# Patient Record
Sex: Male | Born: 1970 | Race: White | Hispanic: No | Marital: Married | Smoking: Never smoker
Health system: Southern US, Community
[De-identification: ages and names within clinical notes are randomized; demographics above are authoritative.]

## PROBLEM LIST (undated history)

## (undated) DIAGNOSIS — R011 Cardiac murmur, unspecified: Secondary | ICD-10-CM

## (undated) DIAGNOSIS — F419 Anxiety disorder, unspecified: Secondary | ICD-10-CM

## (undated) DIAGNOSIS — E119 Type 2 diabetes mellitus without complications: Secondary | ICD-10-CM

## (undated) DIAGNOSIS — F32A Depression, unspecified: Secondary | ICD-10-CM

## (undated) DIAGNOSIS — Q6689 Other  specified congenital deformities of feet: Secondary | ICD-10-CM

## (undated) DIAGNOSIS — Z87438 Personal history of other diseases of male genital organs: Secondary | ICD-10-CM

## (undated) DIAGNOSIS — I10 Essential (primary) hypertension: Secondary | ICD-10-CM

## (undated) HISTORY — DX: Depression, unspecified: F32.A

## (undated) HISTORY — DX: Anxiety disorder, unspecified: F41.9

## (undated) HISTORY — PX: SPINE SURGERY: SHX786

## (undated) HISTORY — DX: Personal history of other diseases of male genital organs: Z87.438

## (undated) HISTORY — PX: FRACTURE SURGERY: SHX138

## (undated) HISTORY — DX: Cardiac murmur, unspecified: R01.1

---
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
## 1999-05-03 DIAGNOSIS — G4700 Insomnia, unspecified: Secondary | ICD-10-CM | POA: Insufficient documentation

## 2000-01-28 DIAGNOSIS — E119 Type 2 diabetes mellitus without complications: Secondary | ICD-10-CM | POA: Insufficient documentation

## 2003-01-01 ENCOUNTER — Other Ambulatory Visit: Payer: Self-pay

## 2004-02-28 HISTORY — PX: KNEE SURGERY: SHX244

## 2006-01-27 ENCOUNTER — Emergency Department: Payer: Self-pay | Admitting: Emergency Medicine

## 2006-06-11 DIAGNOSIS — I10 Essential (primary) hypertension: Secondary | ICD-10-CM | POA: Insufficient documentation

## 2006-09-03 ENCOUNTER — Ambulatory Visit: Payer: Self-pay | Admitting: Emergency Medicine

## 2006-10-22 DIAGNOSIS — R809 Proteinuria, unspecified: Secondary | ICD-10-CM | POA: Insufficient documentation

## 2007-05-11 ENCOUNTER — Emergency Department: Payer: Self-pay | Admitting: Emergency Medicine

## 2008-03-18 DIAGNOSIS — L989 Disorder of the skin and subcutaneous tissue, unspecified: Secondary | ICD-10-CM | POA: Insufficient documentation

## 2008-07-07 DIAGNOSIS — F419 Anxiety disorder, unspecified: Secondary | ICD-10-CM | POA: Insufficient documentation

## 2008-08-23 ENCOUNTER — Emergency Department: Payer: Self-pay | Admitting: Emergency Medicine

## 2008-10-20 ENCOUNTER — Ambulatory Visit: Payer: Self-pay | Admitting: Family Medicine

## 2009-03-18 ENCOUNTER — Ambulatory Visit: Payer: Self-pay | Admitting: Family Medicine

## 2009-03-21 DIAGNOSIS — M25559 Pain in unspecified hip: Secondary | ICD-10-CM | POA: Insufficient documentation

## 2009-05-23 ENCOUNTER — Emergency Department: Payer: Self-pay | Admitting: Emergency Medicine

## 2010-05-25 ENCOUNTER — Ambulatory Visit: Payer: Self-pay | Admitting: Family Medicine

## 2010-06-25 ENCOUNTER — Emergency Department: Payer: Self-pay | Admitting: Emergency Medicine

## 2011-01-22 ENCOUNTER — Emergency Department: Payer: Self-pay | Admitting: Unknown Physician Specialty

## 2011-05-02 ENCOUNTER — Emergency Department: Payer: Self-pay | Admitting: Emergency Medicine

## 2011-05-02 LAB — CBC
HCT: 42 % (ref 40.0–52.0)
HGB: 14.3 g/dL (ref 13.0–18.0)
MCH: 30.2 pg (ref 26.0–34.0)
MCHC: 34.1 g/dL (ref 32.0–36.0)
RBC: 4.74 10*6/uL (ref 4.40–5.90)
WBC: 6.1 10*3/uL (ref 3.8–10.6)

## 2011-05-02 LAB — COMPREHENSIVE METABOLIC PANEL
Albumin: 3.9 g/dL (ref 3.4–5.0)
Alkaline Phosphatase: 39 U/L — ABNORMAL LOW (ref 50–136)
BUN: 14 mg/dL (ref 7–18)
Bilirubin,Total: 0.7 mg/dL (ref 0.2–1.0)
Creatinine: 0.97 mg/dL (ref 0.60–1.30)
EGFR (African American): >60
Glucose: 121 mg/dL — ABNORMAL HIGH (ref 65–99)
Osmolality: 283 (ref 275–301)
SGOT(AST): 36 U/L (ref 15–37)
SGPT (ALT): 45 U/L
Total Protein: 7.5 g/dL (ref 6.4–8.2)

## 2011-05-02 LAB — URINALYSIS, COMPLETE
Bacteria: NONE SEEN
Blood: NEGATIVE
Glucose,UR: NEGATIVE mg/dL (ref 0–75)
Leukocyte Esterase: NEGATIVE
Nitrite: NEGATIVE
Protein: NEGATIVE
WBC UR: 1 /HPF (ref 0–5)

## 2011-05-02 LAB — LIPASE, BLOOD: Lipase: 118 U/L (ref 73–393)

## 2011-08-18 ENCOUNTER — Ambulatory Visit: Payer: Self-pay | Admitting: General Practice

## 2011-09-14 ENCOUNTER — Emergency Department: Payer: Self-pay | Admitting: Emergency Medicine

## 2011-09-14 LAB — CBC
HCT: 42.5 % (ref 40.0–52.0)
MCV: 89 fL (ref 80–100)
RDW: 13.1 % (ref 11.5–14.5)
WBC: 11.3 10*3/uL — ABNORMAL HIGH (ref 3.8–10.6)

## 2011-09-14 LAB — URINALYSIS, COMPLETE
Bacteria: NONE SEEN
Bilirubin,UR: NEGATIVE
Blood: NEGATIVE
Glucose,UR: NEGATIVE mg/dL (ref 0–75)
Ketone: NEGATIVE
Leukocyte Esterase: NEGATIVE
Nitrite: NEGATIVE
Ph: 6 (ref 4.5–8.0)
Protein: NEGATIVE
RBC,UR: 1 /HPF (ref 0–5)
Specific Gravity: 1.023 (ref 1.003–1.030)
Squamous Epithelial: <1
WBC UR: 1 /HPF (ref 0–5)

## 2011-09-14 LAB — BASIC METABOLIC PANEL
Anion Gap: 7 (ref 7–16)
BUN: 16 mg/dL (ref 7–18)
Calcium, Total: 8.9 mg/dL (ref 8.5–10.1)
Chloride: 106 mmol/L (ref 98–107)
Co2: 30 mmol/L (ref 21–32)
Creatinine: 1.03 mg/dL (ref 0.60–1.30)
Osmolality: 286 (ref 275–301)
Potassium: 3.8 mmol/L (ref 3.5–5.1)

## 2011-10-27 ENCOUNTER — Emergency Department: Payer: Self-pay | Admitting: Emergency Medicine

## 2011-10-27 LAB — URINALYSIS, COMPLETE
Bacteria: NONE SEEN
Blood: NEGATIVE
Glucose,UR: NEGATIVE mg/dL (ref 0–75)
Nitrite: NEGATIVE
Ph: 5 (ref 4.5–8.0)
Protein: NEGATIVE
RBC,UR: 1 /HPF (ref 0–5)
Specific Gravity: 1.027 (ref 1.003–1.030)
Squamous Epithelial: NONE SEEN

## 2011-10-27 LAB — BASIC METABOLIC PANEL
Anion Gap: 7 (ref 7–16)
Calcium, Total: 8.8 mg/dL (ref 8.5–10.1)
Chloride: 107 mmol/L (ref 98–107)
Creatinine: 0.94 mg/dL (ref 0.60–1.30)
EGFR (Non-African Amer.): >60
Glucose: 106 mg/dL — ABNORMAL HIGH (ref 65–99)
Osmolality: 283 (ref 275–301)
Potassium: 3.6 mmol/L (ref 3.5–5.1)

## 2011-10-27 LAB — CBC
HCT: 41.3 % (ref 40.0–52.0)
MCH: 30.6 pg (ref 26.0–34.0)
MCV: 86 fL (ref 80–100)
Platelet: 240 10*3/uL (ref 150–440)

## 2012-05-21 ENCOUNTER — Ambulatory Visit: Payer: Self-pay | Admitting: General Practice

## 2012-08-28 ENCOUNTER — Emergency Department: Payer: Self-pay | Admitting: Emergency Medicine

## 2012-08-29 LAB — CBC WITH DIFFERENTIAL/PLATELET
Basophil #: 0 10*3/uL (ref 0.0–0.1)
Eosinophil %: 0 %
HCT: 42.6 % (ref 40.0–52.0)
HGB: 15 g/dL (ref 13.0–18.0)
MCH: 30.4 pg (ref 26.0–34.0)
MCV: 87 fL (ref 80–100)
Neutrophil #: 12 10*3/uL — ABNORMAL HIGH (ref 1.4–6.5)
Neutrophil %: 83.4 %
Platelet: 303 10*3/uL (ref 150–440)
RDW: 13 % (ref 11.5–14.5)
WBC: 14.4 10*3/uL — ABNORMAL HIGH (ref 3.8–10.6)

## 2012-08-29 LAB — BASIC METABOLIC PANEL
Anion Gap: 6 — ABNORMAL LOW (ref 7–16)
Calcium, Total: 9.1 mg/dL (ref 8.5–10.1)
Chloride: 107 mmol/L (ref 98–107)
EGFR (African American): >60
EGFR (Non-African Amer.): >60
Osmolality: 288 (ref 275–301)
Potassium: 3.9 mmol/L (ref 3.5–5.1)
Sodium: 143 mmol/L (ref 136–145)

## 2013-01-10 ENCOUNTER — Emergency Department: Payer: Self-pay | Admitting: Emergency Medicine

## 2013-01-31 ENCOUNTER — Emergency Department: Payer: Self-pay | Admitting: Emergency Medicine

## 2013-01-31 LAB — URINALYSIS, COMPLETE
Bilirubin,UR: NEGATIVE
Nitrite: NEGATIVE
Ph: 6 (ref 4.5–8.0)
RBC,UR: <1 /HPF (ref 0–5)

## 2013-03-02 IMAGING — CR DG HIP COMPLETE 2+V*L*
1 series · 2 of 2 positions shown · non-contrast
Comparison: none

REASON FOR EXAM: pain
COMMENTS:

PROCEDURE:     KDR - KDXR HIP LEFT COMPLETE  - May 25, 2010  [DATE]
RESULT:     AP and lateral views of the left hip reveal the bones intact. I
see no evidence of fracture nor dislocation or significant degenerative
change.

[Series 1: view not recorded · 0.17mm/px · 2 of 2 slices shown]
[im 1/2]
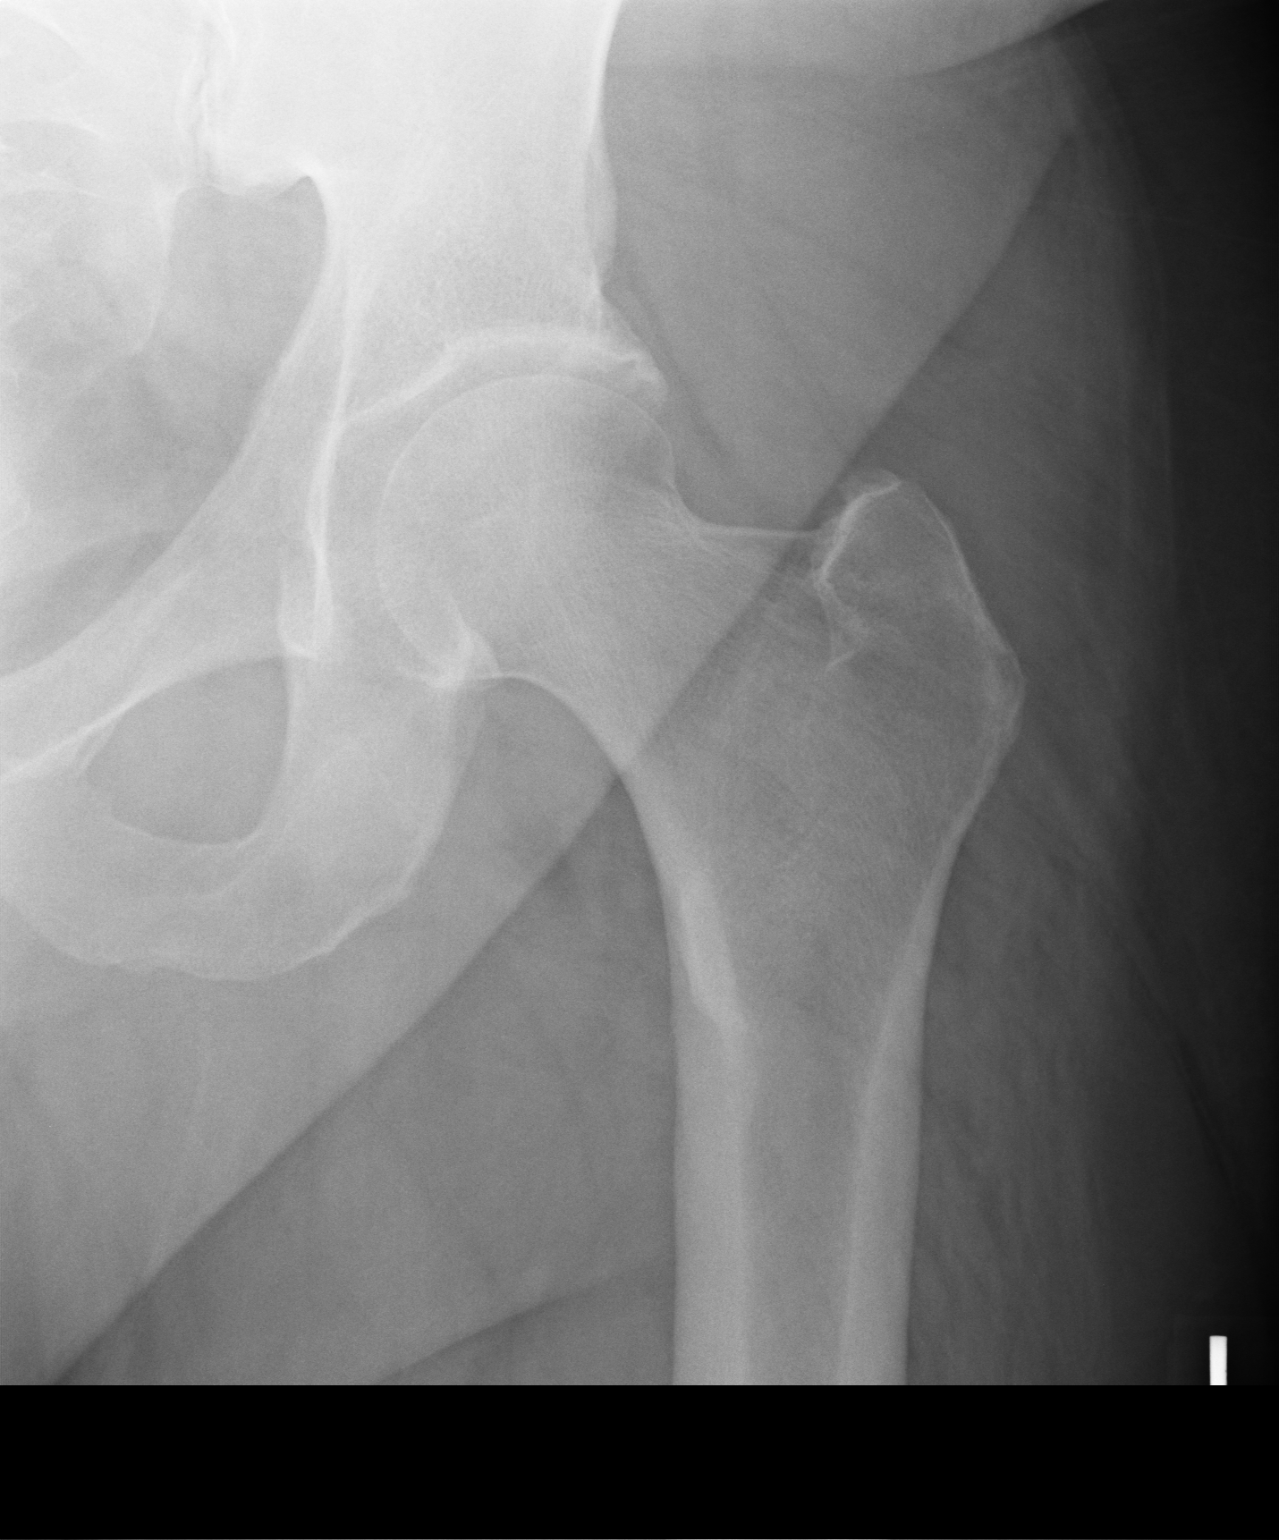
[im 2/2]
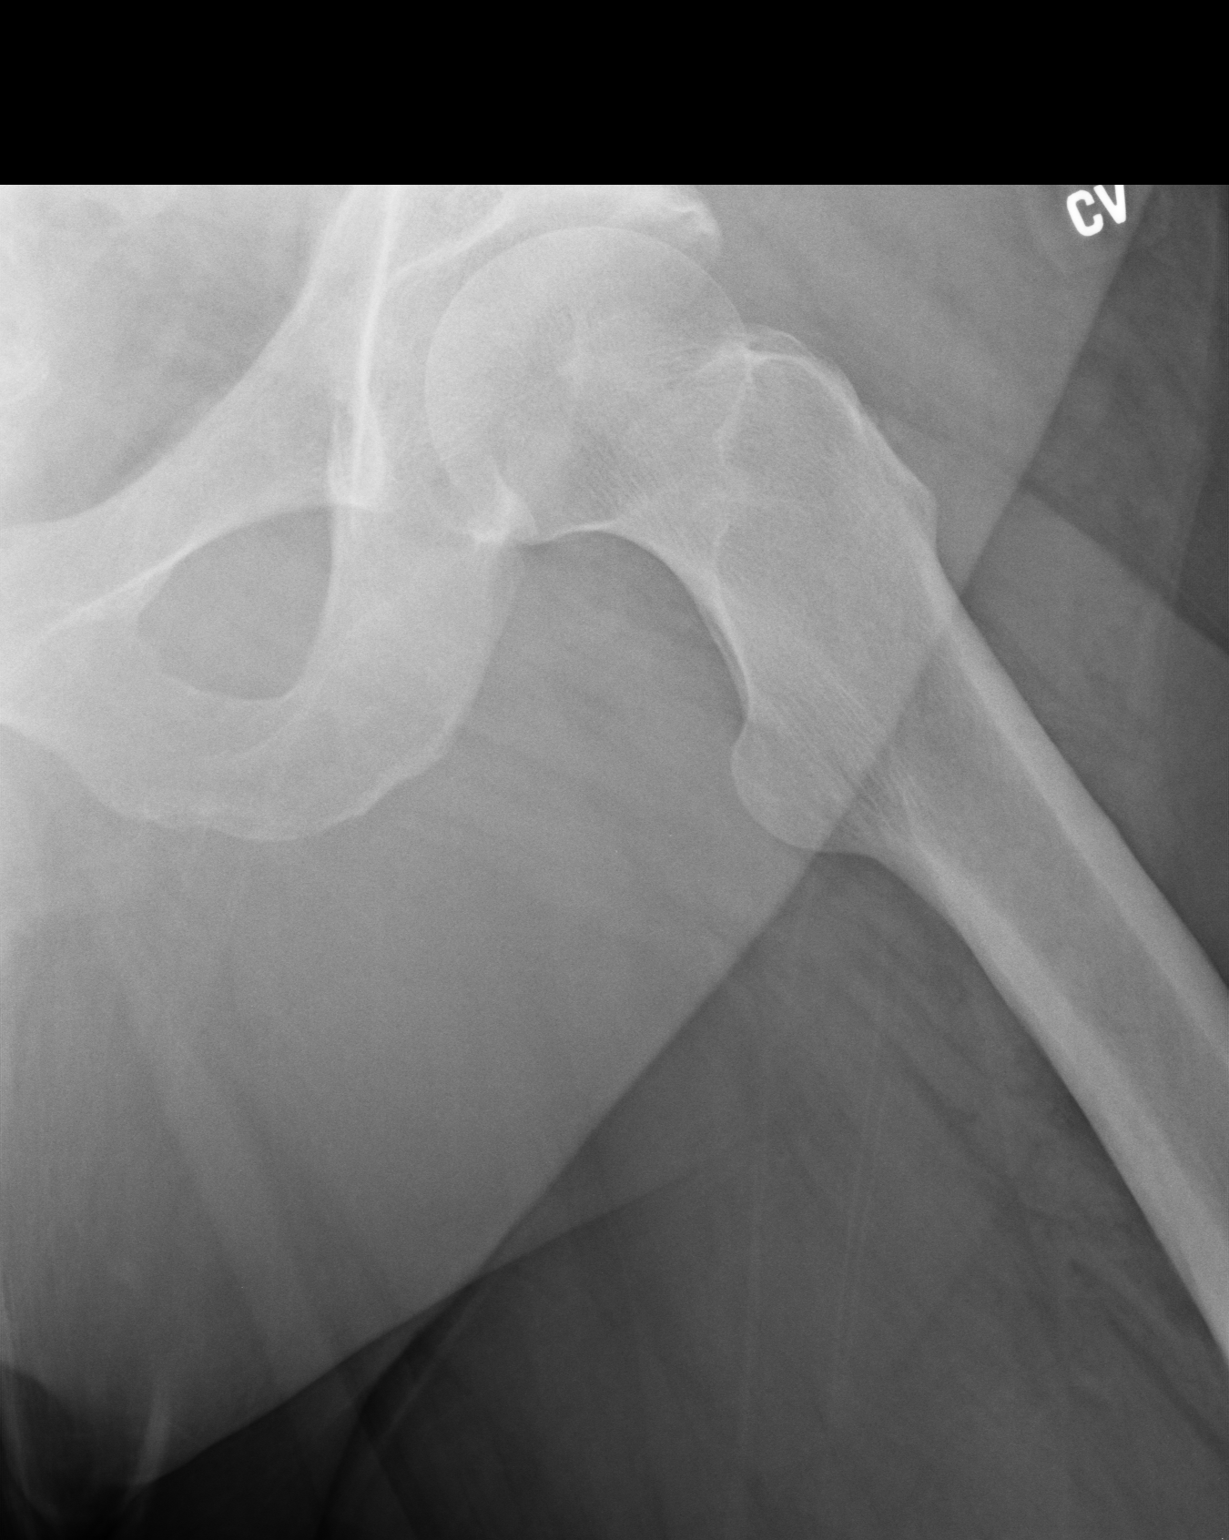

[2 of 2 positions shown; findings below may reference images not displayed]

IMPRESSION: I see no acute bony abnormality of the left hip.

## 2013-08-26 ENCOUNTER — Ambulatory Visit: Payer: Self-pay | Admitting: Physician Assistant

## 2013-09-03 LAB — BASIC METABOLIC PANEL
BUN: 12 mg/dL (ref 4–21)
Creatinine: 0.9 mg/dL (ref 0.6–1.3)
Glucose: 109 mg/dL
Sodium: 145 mmol/L (ref 137–147)

## 2013-09-03 LAB — TSH: TSH: 3.81 u[IU]/mL (ref 0.41–5.90)

## 2013-09-10 ENCOUNTER — Emergency Department: Payer: Self-pay | Admitting: Internal Medicine

## 2013-09-10 LAB — CBC
HCT: 43.2 % (ref 40.0–52.0)
HGB: 14.7 g/dL (ref 13.0–18.0)
MCH: 30.8 pg (ref 26.0–34.0)
MCHC: 34.1 g/dL (ref 32.0–36.0)
MCV: 90 fL (ref 80–100)
Platelet: 242 10*3/uL (ref 150–440)
RBC: 4.78 10*6/uL (ref 4.40–5.90)
RDW: 13.2 % (ref 11.5–14.5)
WBC: 10.2 10*3/uL (ref 3.8–10.6)

## 2013-09-10 LAB — DRUG SCREEN, URINE
AMPHETAMINES, UR SCREEN: NEGATIVE (ref ?–1000)
BENZODIAZEPINE, UR SCRN: NEGATIVE (ref ?–200)
Barbiturates, Ur Screen: NEGATIVE (ref ?–200)
Cannabinoid 50 Ng, Ur ~~LOC~~: NEGATIVE (ref ?–50)
Cocaine Metabolite,Ur ~~LOC~~: NEGATIVE (ref ?–300)
MDMA (ECSTASY) UR SCREEN: NEGATIVE (ref ?–500)
Methadone, Ur Screen: NEGATIVE (ref ?–300)
Opiate, Ur Screen: NEGATIVE (ref ?–300)
PHENCYCLIDINE (PCP) UR S: NEGATIVE (ref ?–25)
Tricyclic, Ur Screen: NEGATIVE (ref ?–1000)

## 2013-09-10 LAB — BASIC METABOLIC PANEL
Anion Gap: 7 (ref 7–16)
BUN: 15 mg/dL (ref 7–18)
CALCIUM: 8.8 mg/dL (ref 8.5–10.1)
CREATININE: 1.04 mg/dL (ref 0.60–1.30)
Chloride: 104 mmol/L (ref 98–107)
Co2: 28 mmol/L (ref 21–32)
Glucose: 129 mg/dL — ABNORMAL HIGH (ref 65–99)
OSMOLALITY: 280 (ref 275–301)
Potassium: 3.3 mmol/L — ABNORMAL LOW (ref 3.5–5.1)
Sodium: 139 mmol/L (ref 136–145)

## 2013-09-10 LAB — D-DIMER(ARMC): D-Dimer: 168 ng/ml

## 2013-09-10 LAB — TROPONIN I: Troponin-I: <0.02 ng/mL

## 2013-09-10 LAB — TSH: Thyroid Stimulating Horm: 2.87 u[IU]/mL

## 2013-12-08 ENCOUNTER — Emergency Department: Payer: Self-pay | Admitting: Emergency Medicine

## 2013-12-09 LAB — CBC
HCT: 42.8 % (ref 40.0–52.0)
HGB: 14.3 g/dL (ref 13.0–18.0)
MCH: 30.1 pg (ref 26.0–34.0)
MCHC: 33.3 g/dL (ref 32.0–36.0)
MCV: 90 fL (ref 80–100)
PLATELETS: 220 10*3/uL (ref 150–440)
RBC: 4.74 10*6/uL (ref 4.40–5.90)
RDW: 13 % (ref 11.5–14.5)
WBC: 9.3 10*3/uL (ref 3.8–10.6)

## 2013-12-09 LAB — BASIC METABOLIC PANEL
ANION GAP: 2 — AB (ref 7–16)
BUN: 15 mg/dL (ref 7–18)
Calcium, Total: 8.1 mg/dL — ABNORMAL LOW (ref 8.5–10.1)
Chloride: 110 mmol/L — ABNORMAL HIGH (ref 98–107)
Co2: 29 mmol/L (ref 21–32)
Creatinine: 1.07 mg/dL (ref 0.60–1.30)
EGFR (Non-African Amer.): >60
GLUCOSE: 122 mg/dL — AB (ref 65–99)
Osmolality: 283 (ref 275–301)
Potassium: 3.6 mmol/L (ref 3.5–5.1)
SODIUM: 141 mmol/L (ref 136–145)

## 2013-12-09 LAB — TROPONIN I: Troponin-I: <0.02 ng/mL

## 2013-12-09 LAB — MAGNESIUM: Magnesium: 2 mg/dL

## 2013-12-22 ENCOUNTER — Ambulatory Visit: Payer: Self-pay | Admitting: Internal Medicine

## 2014-04-29 LAB — HEMOGLOBIN A1C: Hgb A1c MFr Bld: 5.7 % (ref 4.0–6.0)

## 2014-06-21 NOTE — Consult Note (Signed)
Brief Consult Note: Diagnosis: lt abd wall strain.   Patient was seen by consultant.   Consult note dictated.   Recommend further assessment or treatment.   Discussed with Attending MD.   Comments: prob strain related to recent wt lifting. no sign of hernia,  rec rest and NSAID.  Electronic Signatures: Lattie Hawooper, Richard E (MD)  (Signed 05-Mar-13 11:42)  Authored: Brief Consult Note   Last Updated: 05-Mar-13 11:42 by Lattie Hawooper, Richard E (MD)

## 2014-06-21 NOTE — Consult Note (Signed)
PATIENT NAME:  [name], [name] MR#:  [id number] DATE OF BIRTH:  1971-01-16  DATE OF CONSULTATION:  05/02/2011  CONSULTING PHYSICIAN:  Adah Salvageichard E. Excell Seltzerooper, MD  CHIEF COMPLAINT: Left lower quadrant pain.   HISTORY OF PRESENT ILLNESS: This is a patient who doing some very heavy weight lifting outside of his normal workout regimen last Thursday, and on Friday he woke up with considerable pain in his left lower abdomen. He points to an area well above ASIS, not in his groin. He has had no nausea, vomiting, nodes, fevers, or chills. He states that his pain was getting better over the weekend, and then today he woke up and it was more severe than it had been. He denies bulge and  has never had an episode like this before.   PAST MEDICAL HISTORY:  1. Diabetes.  2. Hypertension.  3. Morbid obesity.   PAST SURGICAL HISTORY:  1. Left tibial plateau fracture. 2. Right hydrocele repair.  3. Right foot surgery.   ALLERGIES: Augmentin and Keflex.   MEDICATIONS: Multiple, see chart.   FAMILY HISTORY: Noncontributory.   SOCIAL HISTORY: The patient works in [employer]ing [employer] for the [employer]. He does not smoke or drink.   REVIEW OF SYSTEMS: A 10-system review was performed and negative with the exception of that mentioned in the history of present illness.   PHYSICAL EXAMINATION:  GENERAL: A morbidly obese male patient with a BMI of 47, 346 pounds, 72 inches.   HEENT: No scleral icterus.   NECK: No palpable neck nodes.   CHEST: Clear to auscultation.   CARDIAC: Regular rate and rhythm.   ABDOMEN: Soft, nondistended, nontender.   GENITOURINARY: Exam shows no sign of hernia, no bulge, no tenderness in the internal ring. No rebound or percussion tenderness, and the area where he points to maximal tenderness is above the ASIS, but I cannot elicit any tenderness.   EXTREMITIES: Without edema. Calves are nontender.   NEUROLOGIC: Grossly intact.   INTEGUMENT: No jaundice.   LABORATORY, DIAGNOSTIC AND RADIOLOGICAL  DATA:  Laboratory values show essentially no abnormalities.  Urinalysis is clean.  A CT scan was performed and personally reviewed and is negative for hernia or intra-abdominal pathology.   ASSESSMENT AND PLAN: This is a patient with a history of heavy weight lifting who presents to the ER with left lower quadrant pain. He points to an area well above ASIS outside the area where one would expect pain from a hernia. I cannot elicit any tenderness. I see no signs of a hernia. I believe that this is probably an abdominal wall strain, and I have spoken to Dr. Buford DresserFinnell concerning disposition of this patient. I believe the patient should have ibuprofen and rest at this point and follow up on an as-needed basis.  ____________________________ Adah Salvageichard E. Excell Seltzerooper, MD rec:cbb D: 05/02/2011 11:45:20 ET T: 05/02/2011 12:52:53 ET JOB#: [id number] cc: Adah Salvageichard E. Excell Seltzerooper, MD, <Dictator> Lattie HawICHARD E Shakiya Mcneary MD ELECTRONICALLY SIGNED 05/03/2011 7:28

## 2014-07-01 ENCOUNTER — Emergency Department: Payer: PRIVATE HEALTH INSURANCE

## 2014-07-01 ENCOUNTER — Encounter: Payer: Self-pay | Admitting: Emergency Medicine

## 2014-07-01 ENCOUNTER — Emergency Department
Admission: EM | Admit: 2014-07-01 | Discharge: 2014-07-01 | Disposition: A | Discharge status: Home / Self Care | Payer: PRIVATE HEALTH INSURANCE | Attending: Emergency Medicine | Admitting: Emergency Medicine

## 2014-07-01 ENCOUNTER — Other Ambulatory Visit: Payer: Self-pay

## 2014-07-01 DIAGNOSIS — R109 Unspecified abdominal pain: Secondary | ICD-10-CM | POA: Insufficient documentation

## 2014-07-01 DIAGNOSIS — E119 Type 2 diabetes mellitus without complications: Secondary | ICD-10-CM | POA: Insufficient documentation

## 2014-07-01 DIAGNOSIS — R1012 Left upper quadrant pain: Secondary | ICD-10-CM | POA: Diagnosis present

## 2014-07-01 DIAGNOSIS — M549 Dorsalgia, unspecified: Secondary | ICD-10-CM | POA: Diagnosis not present

## 2014-07-01 DIAGNOSIS — I10 Essential (primary) hypertension: Secondary | ICD-10-CM | POA: Insufficient documentation

## 2014-07-01 HISTORY — DX: Type 2 diabetes mellitus without complications: E11.9

## 2014-07-01 HISTORY — DX: Essential (primary) hypertension: I10

## 2014-07-01 LAB — CBC WITH DIFFERENTIAL/PLATELET
Basophils Absolute: 0.1 10*3/uL (ref 0–0.1)
Basophils Relative: 1 %
EOS ABS: 0.3 10*3/uL (ref 0–0.7)
EOS PCT: 4 %
HCT: 43.1 % (ref 40.0–52.0)
Hemoglobin: 15.1 g/dL (ref 13.0–18.0)
LYMPHS ABS: 2.4 10*3/uL (ref 1.0–3.6)
LYMPHS PCT: 25 %
MCH: 30.9 pg (ref 26.0–34.0)
MCHC: 34.9 g/dL (ref 32.0–36.0)
MCV: 88.5 fL (ref 80.0–100.0)
MONOS PCT: 8 %
Monocytes Absolute: 0.8 10*3/uL (ref 0.2–1.0)
Neutro Abs: 5.9 10*3/uL (ref 1.4–6.5)
Neutrophils Relative %: 62 %
PLATELETS: 271 10*3/uL (ref 150–440)
RBC: 4.88 MIL/uL (ref 4.40–5.90)
RDW: 13.4 % (ref 11.5–14.5)
WBC: 9.5 10*3/uL (ref 3.8–10.6)

## 2014-07-01 LAB — COMPREHENSIVE METABOLIC PANEL
ALBUMIN: 4.2 g/dL (ref 3.5–5.0)
ALK PHOS: 45 U/L (ref 38–126)
ALT: 65 U/L — ABNORMAL HIGH (ref 17–63)
ANION GAP: 10 (ref 5–15)
AST: 64 U/L — ABNORMAL HIGH (ref 15–41)
BUN: 13 mg/dL (ref 6–20)
CO2: 28 mmol/L (ref 22–32)
CREATININE: 0.82 mg/dL (ref 0.61–1.24)
Calcium: 9.1 mg/dL (ref 8.9–10.3)
Chloride: 102 mmol/L (ref 101–111)
GFR calc Af Amer: >60 mL/min (ref >60)
GFR calc non Af Amer: >60 mL/min (ref >60)
Glucose, Bld: 130 mg/dL — ABNORMAL HIGH (ref 65–99)
POTASSIUM: 3.7 mmol/L (ref 3.5–5.1)
Sodium: 140 mmol/L (ref 135–145)
TOTAL PROTEIN: 7.7 g/dL (ref 6.5–8.1)
Total Bilirubin: 0.7 mg/dL (ref 0.3–1.2)

## 2014-07-01 LAB — URINALYSIS COMPLETE WITH MICROSCOPIC (ARMC ONLY)
BACTERIA UA: NONE SEEN
BILIRUBIN URINE: NEGATIVE
GLUCOSE, UA: NEGATIVE mg/dL
Ketones, ur: NEGATIVE mg/dL
Leukocytes, UA: NEGATIVE
NITRITE: NEGATIVE
Protein, ur: NEGATIVE mg/dL
SQUAMOUS EPITHELIAL / LPF: NONE SEEN
Specific Gravity, Urine: 1.018 (ref 1.005–1.030)
pH: 6 (ref 5.0–8.0)

## 2014-07-01 LAB — LIPASE, BLOOD: Lipase: 37 U/L (ref 22–51)

## 2014-07-01 LAB — TROPONIN I: Troponin I: <0.03 ng/mL (ref <0.031)

## 2014-07-01 MED ORDER — IBUPROFEN 800 MG PO TABS: 800.0000 mg | ORAL_TABLET | Freq: Three times a day (TID) | ORAL | Status: DC | PRN

## 2014-07-01 MED ORDER — KETOROLAC TROMETHAMINE 30 MG/ML IJ SOLN
30.0000 mg | Freq: Once | INTRAMUSCULAR | Status: AC
  Administered 2014-07-01: 30 mg via INTRAVENOUS
  Administered 2014-07-01: 15:00:00 via INTRAVENOUS

## 2014-07-01 MED ORDER — DIAZEPAM 5 MG PO TABS: 5.0000 mg | ORAL_TABLET | Freq: Three times a day (TID) | ORAL | Status: DC | PRN

## 2014-07-01 MED ORDER — ONDANSETRON HCL 4 MG/2ML IJ SOLN
4.0000 mg | Freq: Once | INTRAMUSCULAR | Status: AC
  Administered 2014-07-01: 4 mg via INTRAVENOUS

## 2014-07-01 MED ORDER — ONDANSETRON HCL 4 MG/2ML IJ SOLN
INTRAMUSCULAR | Status: AC
  Filled 2014-07-01: qty 2

## 2014-07-01 MED ORDER — SODIUM CHLORIDE 0.9 % IV SOLN
Freq: Once | INTRAVENOUS | Status: AC
  Administered 2014-07-01: 15:00:00 via INTRAVENOUS

## 2014-07-01 MED ORDER — KETOROLAC TROMETHAMINE 30 MG/ML IJ SOLN
INTRAMUSCULAR | Status: AC
  Filled 2014-07-01: qty 1

## 2014-07-01 NOTE — ED Notes (Signed)
LLQ abd pain that that goes around to back. States that pain radiates to left shoulder.

## 2014-07-01 NOTE — ED Provider Notes (Addendum)
CSN: [id number]     Arrival date & time 07/01/14  1249     HPI  Review of Systems    Allergies   Home Medications    Physical Exam   Procedures  [facility] Emergency Department Provider Note   Time seen: 1440 p.m.  I have reviewed the triage vital signs and the nursing notes.   HISTORY  Chief Complaint Abdominal Pain    HPI Micheal FriezeBrian S Wheeler is a 44 y.o. male is easier for left upper quadrant pain goes around to his back. States it's sharp radius to his left shoulder. He denies history of same. Pain is currently moderate to severe nothing makes it better deep breath seems to make it worse. Nausea started yesterday and the pain started today.    Past Medical History  Diagnosis Date  . Hypertension   . Diabetes mellitus without complication     There are no active problems to display for this patient.   No past surgical history on file.  No current outpatient prescriptions on file.  Allergies Medrol  No family history on file.  Social History History  Substance Use Topics  . Smoking status: Not on file  . Smokeless tobacco: Not on file  . Alcohol Use: Yes    Review of Systems Constitutional: Negative for fever. Eyes: Negative for visual changes. ENT: Negative for sore throat. Cardiovascular: Negative for chest pain. Respiratory: Negative for shortness of breath. Gastrointestinal: Positive  for abdominal pain, negative for vomiting and diarrhea. Genitourinary: Negative for dysuria. Musculoskeletal: Positive for back pain Skin: Negative for rash. Neurological: Negative for headaches, focal weakness or numbness.  10-point ROS otherwise negative.  ____________________________________________   PHYSICAL EXAM:  VITAL SIGNS: ED Triage Vitals  Enc Vitals Group     BP 07/01/14 1259 144/80 mmHg     Pulse Rate 07/01/14 1257 58     Resp --      Temp 07/01/14 1257 98 F (36.7 C)     Temp Source 07/01/14 1257 Oral     SpO2  07/01/14 1257 99 %     Weight 07/01/14 1257 350 lb (158.759 kg)     Height 07/01/14 1257 6' (1.829 m)     Head Cir --      Peak Flow --      Pain Score --      Pain Loc --      Pain Edu? --      Excl. in GC? --     Constitutional: Alert and oriented. Well appearing in mild distress Eyes: Conjunctivae are normal. PERRL. Normal extraocular movements. ENT   Head: Normocephalic and atraumatic.   Nose: No congestion/rhinnorhea.   Mouth/Throat: Mucous membranes are moist.   Neck: No stridor. Hematological/Lymphatic/Immunilogical: No cervical lymphadenopathy. Cardiovascular: Normal rate, regular rhythm. Normal and symmetric distal pulses are present in all extremities. No murmurs, rubs, or gallops. Respiratory: Normal respiratory effort without tachypnea nor retractions. Breath sounds are clear and equal bilaterally. No wheezes/rales/rhonchi. Gastrointestinal: Soft and nontender. No distention. No abdominal bruits. There is no CVA tenderness.  Musculoskeletal: Nontender with normal range of motion in all extremities. No joint effusions.  No lower extremity tenderness nor edema. Neurologic:  Normal speech and language. No gross focal neurologic deficits are appreciated. Speech is normal. No gait instability. Skin:  Skin is warm, dry and intact. No rash noted. Psychiatric: Mood anno hematuriad affect are normal. Speech and behavior are normal. Patient exhibits appropriate insight and judgment.  ____________________________________________  LABS (pertinent positives/negatives)  Grossly unremarkable no hematuria  ____________________________________________  EKG: EKG with sinus bradycardia normal axis normal intervals no evidence of hypertrophy or acute infarction. EKG interpreted by me    RADIOLOGY  CT negative for any acute process.  ____________________________________________    IMPRESSION / ASSESSMENT AND PLAN / ED COURSE  Pertinent labs & imaging results  that were available during my care of the patient were reviewed by me and considered in my medical decision making (see chart for details).  Assessment: flank pain Plan: Patient just installed a TV yesterday most likely this is muscular discharged with Motrin and Valium. We'll follow up with his primary care doctor if not improving.     [name], Sritha Chauncey E, MD   Emily FilbertJonathan E [name], MD 07/01/14 1603  Emily FilbertJonathan E Olaf Mesa, MD 07/01/14 1604  Emily FilbertJonathan E Blaklee Shores, MD 07/22/14 [phone]

## 2014-07-01 NOTE — Discharge Instructions (Signed)
Abdominal Pain °Many things can cause abdominal pain. Usually, abdominal pain is not caused by a disease and will improve without treatment. It can often be observed and treated at home. Your health care provider will do a physical exam and possibly order blood tests and X-rays to help determine the seriousness of your pain. However, in many cases, more time must pass before a clear cause of the pain can be found. Before that point, your health care provider may not know if you need more testing or further treatment. °HOME CARE INSTRUCTIONS  °Monitor your abdominal pain for any changes. The following actions may help to alleviate any discomfort you are experiencing: °· Only take over-the-counter or prescription medicines as directed by your health care provider. °· Do not take laxatives unless directed to do so by your health care provider. °· Try a clear liquid diet (broth, tea, or water) as directed by your health care provider. Slowly move to a bland diet as tolerated. °SEEK MEDICAL CARE IF: °· You have unexplained abdominal pain. °· You have abdominal pain associated with nausea or diarrhea. °· You have pain when you urinate or have a bowel movement. °· You experience abdominal pain that wakes you in the night. °· You have abdominal pain that is worsened or improved by eating food. °· You have abdominal pain that is worsened with eating fatty foods. °· You have a fever. °SEEK IMMEDIATE MEDICAL CARE IF:  °· Your pain does not go away within 2 hours. °· You keep throwing up (vomiting). °· Your pain is felt only in portions of the abdomen, such as the right side or the left lower portion of the abdomen. °· You pass bloody or black tarry stools. °MAKE SURE YOU: °· Understand these instructions.   °· Will watch your condition.   °· Will get help right away if you are not doing well or get worse.   °Document Released: 11/23/2004 Document Revised: 02/18/2013 Document Reviewed: 10/23/2012 °Exit[employer]® Patient Information  ©2015 [employer]. This information is not intended to replace advice given to you by your health care provider. Make sure you discuss any questions you have with your health care provider. ° °Flank Pain °Flank pain is pain in your side. The flank is the area of your side between your upper belly (abdomen) and your back. Pain in this area can be caused by many different things. °HOME CARE °Home care and treatment will depend on the cause of your pain. °· Rest as told by your doctor. °· Drink enough fluids to keep your pee (urine) clear or pale yellow.   °· Only take medicine as told by your doctor. °· Tell your doctor about any changes in your pain. °· Follow up with your doctor. °GET HELP RIGHT AWAY IF:  °· Your pain does not get better with medicine.   °· You have new symptoms or your symptoms get worse. °· Your pain gets worse.   °· You have belly (abdominal) pain.   °· You are short of breath.   °· You always feel sick to your stomach (nauseous).   °· You keep throwing up (vomiting).   °· You have puffiness (swelling) in your belly.   °· You feel light-headed or you pass out (faint).   °· You have blood in your pee. °· You have a fever or lasting symptoms for more than 2-3 days. °· You have a fever and your symptoms suddenly get worse. °MAKE SURE YOU:  °· Understand these instructions. °· Will watch your condition. °· Will get help right away if you   are not doing well or get worse. °Document Released: 11/23/2007 Document Revised: 06/30/2013 Document Reviewed: 09/28/2011 °ExitCare® Patient Information ©2015 [employer]. This information is not intended to replace advice given to you by your health care provider. Make sure you discuss any questions you have with your health care provider. ° °

## 2014-09-07 ENCOUNTER — Other Ambulatory Visit: Payer: Self-pay | Admitting: Family Medicine

## 2014-09-16 ENCOUNTER — Ambulatory Visit (INDEPENDENT_AMBULATORY_CARE_PROVIDER_SITE_OTHER): Payer: PRIVATE HEALTH INSURANCE | Admitting: Family Medicine

## 2014-09-16 ENCOUNTER — Encounter: Payer: Self-pay | Admitting: Family Medicine

## 2014-09-16 VITALS — BP 120/64 | HR 57 | Temp 97.7°F | Resp 16

## 2014-09-16 DIAGNOSIS — F410 Panic disorder [episodic paroxysmal anxiety] without agoraphobia: Secondary | ICD-10-CM | POA: Insufficient documentation

## 2014-09-16 DIAGNOSIS — R000 Tachycardia, unspecified: Secondary | ICD-10-CM | POA: Insufficient documentation

## 2014-09-16 DIAGNOSIS — R002 Palpitations: Secondary | ICD-10-CM | POA: Insufficient documentation

## 2014-09-16 DIAGNOSIS — E118 Type 2 diabetes mellitus with unspecified complications: Secondary | ICD-10-CM | POA: Diagnosis not present

## 2014-09-16 DIAGNOSIS — R001 Bradycardia, unspecified: Secondary | ICD-10-CM | POA: Insufficient documentation

## 2014-09-16 DIAGNOSIS — M25562 Pain in left knee: Secondary | ICD-10-CM | POA: Insufficient documentation

## 2014-09-16 LAB — POCT GLYCOSYLATED HEMOGLOBIN (HGB A1C)
Est. average glucose Bld gHb Est-mCnc: 134
HEMOGLOBIN A1C: 6.3

## 2014-09-16 MED ORDER — METFORMIN HCL ER 500 MG PO TB24: 500.0000 mg | ORAL_TABLET | Freq: Every day | ORAL | Status: DC

## 2014-09-16 NOTE — Progress Notes (Signed)
Patient: [name] Male    DOB: [birth year]   44 y.o.   MRN: [id number] Visit Date: 09/16/2014  Today's Provider: [name], MD   Chief Complaint  Patient presents with  . Follow-up  . Diabetes   Subjective:    Diabetes Pertinent negatives for hypoglycemia include no dizziness or headaches. Pertinent negatives for diabetes include no chest pain.        Diabetes Mellitus Type II, Follow-up:   Lab Results  Component Value Date   HGBA1C 5.7 04/29/2014    Last seen for diabetes 4 months ago.  Management changes included no changes. He reports good compliance with treatment. He is having side effects. Frequent urination and thrist Current symptoms include none and have been fluctuating . Home blood sugar records: fasting range: 140-160  Usually his sugars stay in the low 100s, but states fasting have gone up over the last few weeks, and postprandials have recently been running in the upper 100s, and sometimes over 200.   He is concerned that sertraline, which he started in April, may be contributing to elevated blood sugars. He has done very well with sertraline, and admits to not being as compliant with diet the last few months. He has also been very busy at work and not getting as much exercise.   Pertinent Labs:    Component Value Date/Time   CREATININE 0.82 07/01/2014 1323   CREATININE 1.07 12/09/2013 0011   CREATININE 0.9 09/03/2013    Wt Readings from Last 3 Encounters:  04/29/14 350 lb (158.759 kg)  07/01/14 350 lb (158.759 kg)    ------------------------------------------------------------------------    Allergies  Allergen Reactions  . Amlodipine Besylate     Constipation, palitations, swelling, shortness of breath  . Augmentin [Amoxicillin-Pot Clavulanate]     intolerant: Causes insomnia  . Cyclobenzaprine   . Keflex  [Cephalexin]     intolerant  . Medrol [Methylprednisolone] Palpitations   Previous Medications   ACETAMINOPHEN  (TYLENOL) 650 MG CR TABLET    Take 2 tablets by mouth daily.   ALPRAZOLAM (XANAX) 0.25 MG TABLET    Take 1-2 tablets by mouth every 6 (six) hours as needed.   ASPIRIN 81 MG TABLET    Take 1 tablet by mouth daily.   DIAZEPAM (VALIUM) 5 MG TABLET    Take 1 tablet (5 mg total) by mouth every 8 (eight) hours as needed for anxiety.   IBUPROFEN (ADVIL,MOTRIN) 800 MG TABLET    Take 1 tablet (800 mg total) by mouth every 8 (eight) hours as needed.   METOPROLOL SUCCINATE (TOPROL-XL) 25 MG 24 HR TABLET    Take 0.5 tablets by mouth daily.   ONE TOUCH ULTRA TEST TEST STRIP    USE UP TO THREE TIMES DAILY AS DIRECTED   QUINAPRIL-HYDROCHLOROTHIAZIDE (ACCURETIC) 20-25 MG PER TABLET    Take 2 tablets by mouth daily.   SERTRALINE (ZOLOFT) 25 MG TABLET    Take 1 tablet by mouth daily.   TRAZODONE (DESYREL) 100 MG TABLET    Take 1.5 tablets by mouth daily.    Review of Systems  Respiratory: Negative for chest tightness and shortness of breath.   Cardiovascular: Negative for chest pain and palpitations.  Neurological: Negative for dizziness, light-headedness and headaches.    History  Substance Use Topics  . Smoking status: Never Smoker   . Smokeless tobacco: Not on file  . Alcohol Use: 0.0 oz/week    0 Standard drinks or equivalent per week  Comment: rare   Objective:   BP 120/64 mmHg  Pulse 57  Temp(Src) 97.7 F (36.5 C) (Oral)  Resp 16  SpO2 98%  Physical Exam  General appearance: alert, well developed, well nourished, , obese cooperative and in no distress Head: Normocephalic, without obvious abnormality, atraumatic Lungs: Respirations even and unlabored Extremities: No gross deformities Skin: Skin color, texture, turgor normal. No rashes seen  Psych: Appropriate mood and affect. Neurologic: Mental status: Alert, oriented to person, place, and time, thought content appropriate.  Results for orders placed or performed in visit on 09/16/14  POCT glycosylated hemoglobin (Hb A1C)  Result  Value Ref Range   Hemoglobin A1C 6.3    Est. average glucose Bld gHb Est-mCnc 134    Wt Readings from Last 3 Encounters:  04/29/14 350 lb (158.759 kg)  07/01/14 350 lb (158.759 kg)       Assessment & Plan:      1. Type 2 diabetes mellitus with complication Increasing sugars over the last several weeks, likely due to be less compliant with diet. He took 1000mg  of metformin many years ago which caused a lot of GI problems, but it doesn't sound like he has ever tried extended release metformin. I think metformin would be more beneficial to him if he can tolerate it. He is also going to be more strict with diet and will recheck A1c in about 2months.  - POCT glycosylated hemoglobin (Hb A1C) - metFORMIN (GLUCOPHAGE-XR) 500 MG 24 hr tablet; Take 1 tablet (500 mg total) by mouth daily.  Dispense: 30 tablet; Refill: 3        Mila Merryonald Aruna Nestler, MD  [facility]

## 2014-10-12 ENCOUNTER — Other Ambulatory Visit: Payer: Self-pay | Admitting: Family Medicine

## 2014-11-04 ENCOUNTER — Other Ambulatory Visit: Payer: Self-pay | Admitting: Family Medicine

## 2014-12-01 ENCOUNTER — Encounter: Payer: Self-pay | Admitting: Family Medicine

## 2014-12-01 ENCOUNTER — Ambulatory Visit (INDEPENDENT_AMBULATORY_CARE_PROVIDER_SITE_OTHER): Payer: PRIVATE HEALTH INSURANCE | Admitting: Family Medicine

## 2014-12-01 VITALS — BP 104/64 | HR 50 | Temp 99.0°F | Resp 16 | Wt 352.0 lb

## 2014-12-01 DIAGNOSIS — I10 Essential (primary) hypertension: Secondary | ICD-10-CM

## 2014-12-01 DIAGNOSIS — F410 Panic disorder [episodic paroxysmal anxiety] without agoraphobia: Secondary | ICD-10-CM

## 2014-12-01 DIAGNOSIS — E118 Type 2 diabetes mellitus with unspecified complications: Secondary | ICD-10-CM

## 2014-12-01 NOTE — Progress Notes (Signed)
Patient: [name] Male    DOB: Mar 01, 1970   44 y.o.   MRN: [id number] Visit Date: 12/01/2014  Today's Provider: [name], MD   Chief Complaint  Patient presents with  . Follow-up  . Diabetes  . Panic Attack  . Insomnia   Subjective:    HPI  Follow-up for insomnia from 06/30/2014; no changes were made.   Follow-up for anxiety/panic attacks from 06/30/2014; changed sertraline HCI 25 mg 1 tab qd. He has had no panic attacks. He has a lot of stress with work and classes, but he feels he is handling the stress and does not feel he needs to increase dose of sertraline. He has not required alprazolam lately.     Diabetes Mellitus Type II, Follow-up:   Lab Results  Component Value Date   HGBA1C 6.3 09/16/2014   HGBA1C 5.7 04/29/2014    Last seen for diabetes 09/16/2014   Management since then includes started metformin 500 mg 24 hour 1 tablet qd. He reports good compliance with treatment. He is not having side effects.   Home blood sugar records: fasting range: 125-145  Episodes of hypoglycemia? no   Current Insulin Regimen: n/a Most Recent Eye Exam: 02/27/2014 Weight trend: stable Prior visit with dietician: no Current diet: well balanced Current exercise: none  Pertinent Labs:    Component Value Date/Time   CREATININE 0.82 07/01/2014 1323   CREATININE 1.07 12/09/2013 0011   CREATININE 0.9 09/03/2013    Wt Readings from Last 3 Encounters:  12/01/14 352 lb (159.666 kg)  04/29/14 350 lb (158.759 kg)  07/01/14 350 lb (158.759 kg)    ---------------------------------------------------------------------   Hypertension, follow-up:  BP Readings from Last 3 Encounters:  12/01/14 104/64  09/16/14 120/64  06/30/14 120/70    He was last seen for hypertension 3 months ago.  BP at that visit was 120/64  . Management since that visit includes continuing same dose of quinapril-hctz. He reports good compliance with treatment. He is not having side  effects.  He is not exercising. He is adherent to low salt diet.   Outside blood pressures are generally in 120-130s/70s.  Patient denies chest pain, dyspnea and palpitations.     Weight trend: stable Wt Readings from Last 3 Encounters:  12/01/14 352 lb (159.666 kg)  04/29/14 350 lb (158.759 kg)  07/01/14 350 lb (158.759 kg)    Current diet: in general, a "healthy" diet    ------------------------------------------------------------------------     Allergies  Allergen Reactions  . Amlodipine Besylate     Constipation, palitations, swelling, shortness of breath  . Augmentin [Amoxicillin-Pot Clavulanate]     intolerant: Causes insomnia  . Cyclobenzaprine   . Keflex  [Cephalexin]     intolerant  . Medrol [Methylprednisolone] Palpitations   Previous Medications   ACETAMINOPHEN (TYLENOL) 650 MG CR TABLET    Take 2 tablets by mouth daily.   ALPRAZOLAM (XANAX) 0.25 MG TABLET    Take 1-2 tablets by mouth every 6 (six) hours as needed.   ASPIRIN 81 MG TABLET    Take 1 tablet by mouth daily.   DIAZEPAM (VALIUM) 5 MG TABLET    Take 1 tablet (5 mg total) by mouth every 8 (eight) hours as needed for anxiety.   IBUPROFEN (ADVIL,MOTRIN) 800 MG TABLET    Take 1 tablet (800 mg total) by mouth every 8 (eight) hours as needed.   METFORMIN (GLUCOPHAGE-XR) 500 MG 24 HR TABLET    Take 1 tablet (500  mg total) by mouth daily.   METOPROLOL SUCCINATE (TOPROL-XL) 25 MG 24 HR TABLET    Take 0.5 tablets by mouth daily.   ONE TOUCH ULTRA TEST TEST STRIP    USE UP TO THREE TIMES DAILY AS DIRECTED   QUINAPRIL-HYDROCHLOROTHIAZIDE (ACCURETIC) 20-25 MG PER TABLET    TAKE TWO (2) TABLETS BY MOUTH DAILY   SERTRALINE (ZOLOFT) 25 MG TABLET    TAKE ONE (1) TABLET BY MOUTH EVERY DAY   TRAZODONE (DESYREL) 100 MG TABLET    Take 1.5 tablets by mouth daily.    Review of Systems  Constitutional: Negative for fever, chills and appetite change.  Respiratory: Negative for chest tightness, shortness of breath and  wheezing.   Cardiovascular: Positive for palpitations. Negative for chest pain and leg swelling.  Gastrointestinal: Negative for nausea, vomiting and abdominal pain.  Neurological: Negative for dizziness, light-headedness and headaches.    Social History  Substance Use Topics  . Smoking status: Never Smoker   . Smokeless tobacco: Not on file  . Alcohol Use: 0.0 oz/week    0 Standard drinks or equivalent per week     Comment: rare   Objective:   BP 104/64 mmHg  Pulse 50  Temp(Src) 99 F (37.2 C) (Oral)  Resp 16  Wt 352 lb (159.666 kg)  SpO2 97%  Physical Exam  General Appearance:    Alert, cooperative, no distress, obese  Eyes:    PERRL, conjunctiva/corneas clear, EOM's intact       Lungs:     Clear to auscultation bilaterally, respirations unlabored  Heart:    Regular rate and rhythm  Neurologic:   Awake, alert, oriented x 3. No apparent focal neurological           defect.           Assessment & Plan:     1. Essential (primary) hypertension Well controlled.  Continue current medications.    2. Type 2 diabetes mellitus with complication, without long-term current use of insulin (HCC) Tolerating addition of metformin well with improved blood sugars - Lipid panel - Hemoglobin A1c  3. Panic attacks Doing well with low dose of sertraline. Continue current medications.     He anticipates getting flu vaccine at work.       [name], MD  [facility]

## 2014-12-02 LAB — HEMOGLOBIN A1C
Est. average glucose Bld gHb Est-mCnc: 137 mg/dL
Hgb A1c MFr Bld: 6.4 % — ABNORMAL HIGH (ref 4.8–5.6)

## 2014-12-02 LAB — LIPID PANEL
CHOLESTEROL TOTAL: 150 mg/dL (ref 100–199)
Chol/HDL Ratio: 3.6 ratio units (ref 0.0–5.0)
HDL: 42 mg/dL (ref >39)
LDL Calculated: 84 mg/dL (ref 0–99)
TRIGLYCERIDES: 121 mg/dL (ref 0–149)
VLDL CHOLESTEROL CAL: 24 mg/dL (ref 5–40)

## 2014-12-10 ENCOUNTER — Other Ambulatory Visit: Payer: Self-pay | Admitting: Family Medicine

## 2014-12-10 NOTE — Telephone Encounter (Signed)
Last ov was on 12/01/2014.  Thanks,

## 2015-01-23 ENCOUNTER — Other Ambulatory Visit: Payer: Self-pay | Admitting: Family Medicine

## 2015-02-03 ENCOUNTER — Encounter: Payer: Self-pay | Admitting: Physician Assistant

## 2015-02-03 ENCOUNTER — Ambulatory Visit: Payer: Self-pay | Admitting: Physician Assistant

## 2015-02-03 VITALS — BP 140/89 | HR 84 | Temp 98.7°F

## 2015-02-03 DIAGNOSIS — J069 Acute upper respiratory infection, unspecified: Secondary | ICD-10-CM

## 2015-02-03 MED ORDER — AZITHROMYCIN 250 MG PO TABS
ORAL_TABLET | ORAL | Status: DC
Start: 2015-02-03 — End: 2015-04-30

## 2015-02-03 NOTE — Progress Notes (Signed)
S: C/o runny nose and congestion for 7 days, ? fever, chills, denies  cp/sob, v/d; mucus is green and thick, cough is sporadic, c/o of facial and dental pain.  Using otc meds: mucinex without much relief  O: PE: vitals wnl, nad, perrl eomi, normocephalic, tms dull, nasal mucosa red and swollen, throat injected, neck supple no lymph, lungs c t a, cv rrr, neuro intact  A:  Acute sinusitis   P: zpack, drink fluids, continue regular meds , use otc meds of choice, return if not improving in 5 days, return earlier if worsening

## 2015-02-17 ENCOUNTER — Encounter: Payer: Self-pay | Admitting: Physician Assistant

## 2015-02-17 ENCOUNTER — Ambulatory Visit: Payer: Self-pay | Admitting: Physician Assistant

## 2015-02-17 VITALS — BP 130/90 | HR 58 | Temp 98.6°F

## 2015-02-17 DIAGNOSIS — M542 Cervicalgia: Secondary | ICD-10-CM

## 2015-02-17 MED ORDER — BACLOFEN 10 MG PO TABS: 10.0000 mg | ORAL_TABLET | Freq: Three times a day (TID) | ORAL | Status: DC

## 2015-02-17 NOTE — Progress Notes (Signed)
S: c/o r sided neck pain and spasms, no known injury, no numbness or tingling in arms, increased pain with rotation and movement of head, feels pulling on r side of head  O: vitals wnl, nad, cspine nontender, trap and supraspinatus muscle spasmed, decreased rom of neck with rotation, grips = b/l, n/v intact  A: acute neck pain, muscle spasm  P: baclofen, ibuprofen, wet heat, massage therapy

## 2015-03-22 ENCOUNTER — Telehealth: Payer: Self-pay | Admitting: Family Medicine

## 2015-03-22 DIAGNOSIS — G4700 Insomnia, unspecified: Secondary | ICD-10-CM

## 2015-03-22 DIAGNOSIS — E119 Type 2 diabetes mellitus without complications: Secondary | ICD-10-CM

## 2015-03-22 MED ORDER — TRAZODONE HCL 50 MG PO TABS: ORAL_TABLET | ORAL | Status: DC

## 2015-03-22 MED ORDER — METFORMIN HCL ER 500 MG PO TB24: 1000.0000 mg | ORAL_TABLET | Freq: Every day | ORAL | Status: DC

## 2015-03-22 NOTE — Telephone Encounter (Signed)
LMTCB 03/22/2015  Thanks,   -Laura  

## 2015-03-22 NOTE — Telephone Encounter (Signed)
Increase metformin xr to 2 tablets daily, #60, rf x 3. Can change trazadone to  2-3 tablets at bedtime as needed, #60, rf x 3. Need to schedule follow up o.v. in6-8 weeks.

## 2015-03-22 NOTE — Telephone Encounter (Signed)
Patient reports that recently it has been hard for him to keep his blood sugars below 200. On average his fasting blood sugars have been around 160-170s. Patient denies any blurred vision, dizziness, numbness or tingling, or lightheadedness. Patient does mention that he has been really sweaty, and noticed that it happens when his blood sugar is elevated. Patient is wondering if his Metformin should be increased? He reports that he has been on Metformin  for about 4-5 months. He reports that you started him on a low dose due to side effects (diarrhea) from the medication. Patient would like the new dose sent into the pharmacy.   Patient also wanted to know if he could change the dose of Trazodone from  1 1/2 tablets to  2-3 tablets as needed for bedtime? He reports that he has only needed to take 1/2 tablet of  ( ) at bedtime lately, and he has destroyed most of his tablets from cutting them in half. Patient thinks it will be easier if he had  tablets instead.

## 2015-03-22 NOTE — Telephone Encounter (Signed)
Pt needs to have his metformin adjusted.  He said his fasting is 160.   Also has questions regarding regarding trazidone.   Please call back at  [phone]  Thanks Barth Kirks

## 2015-03-22 NOTE — Telephone Encounter (Signed)
Pt advised.  Rx sent to [facility],   -Vernona Rieger

## 2015-04-23 ENCOUNTER — Telehealth: Payer: Self-pay | Admitting: Family Medicine

## 2015-04-23 DIAGNOSIS — E119 Type 2 diabetes mellitus without complications: Secondary | ICD-10-CM

## 2015-04-23 NOTE — Telephone Encounter (Signed)
Pt is requesting a lab slip for A1C check ready to be picked up Monday morning.  CB#[phone]

## 2015-04-30 ENCOUNTER — Encounter: Payer: Self-pay | Admitting: Family Medicine

## 2015-04-30 ENCOUNTER — Ambulatory Visit (INDEPENDENT_AMBULATORY_CARE_PROVIDER_SITE_OTHER): Payer: Managed Care, Other (non HMO) | Admitting: Family Medicine

## 2015-04-30 VITALS — BP 120/70 | HR 55 | Temp 98.4°F | Resp 16 | Wt 350.0 lb

## 2015-04-30 DIAGNOSIS — E119 Type 2 diabetes mellitus without complications: Secondary | ICD-10-CM | POA: Diagnosis not present

## 2015-04-30 DIAGNOSIS — I10 Essential (primary) hypertension: Secondary | ICD-10-CM

## 2015-04-30 LAB — POCT GLYCOSYLATED HEMOGLOBIN (HGB A1C)
Est. average glucose Bld gHb Est-mCnc: 146
HEMOGLOBIN A1C: 6.7

## 2015-04-30 NOTE — Progress Notes (Signed)
Patient: [name] Male    DOB: 1970/10/26   45 y.o.   MRN: [id number] Visit Date: 04/30/2015  Today's Provider: [name], MD   Chief Complaint  Patient presents with  . Follow-up  . Diabetes  . Hypertension   Subjective:    HPI   Follow-up for panic attacks from 12/01/2014; no changes.    Diabetes Mellitus Type II, Follow-up:   Lab Results  Component Value Date   HGBA1C 6.4* 12/01/2014   HGBA1C 6.3 09/16/2014   HGBA1C 5.7 04/29/2014   Last seen for diabetes 5 months ago.  Management since then includes; no changes. Last office visit 03/22/2015, increased metformin to 2 tablets qd due to elevated blood sugars. He reports good compliance with treatment. He is not having side effects. none Current symptoms include none and have been unchanged. Home blood sugar records: fasting range: 170  Episodes of hypoglycemia? no   Current Insulin Regimen: n/a Most Recent Eye Exam: due Weight trend: stable Prior visit with dietician: no Current diet: well balanced Current exercise: walking  ----------------------------------------------------------------------   Hypertension, follow-up:  BP Readings from Last 3 Encounters:  04/30/15 120/70  02/17/15 130/90  02/03/15 140/89    He was last seen for hypertension 5 months ago.  BP at that visit was 104/64. Management since that visit includes; no chnages.He reports good compliance with treatment. He is not having side effects. none  He is exercising. He is adherent to low salt diet.   Outside blood pressures are 130/80. He is experiencing none.  Patient denies none.   Cardiovascular risk factors include diabetes mellitus.  Use of agents associated with hypertension: none.   ----------------------------------------------------------------------    Allergies  Allergen Reactions  . Amlodipine Besylate     Constipation, palitations, swelling, shortness of breath  . Augmentin [Amoxicillin-Pot  Clavulanate]     intolerant: Causes insomnia  . Cyclobenzaprine   . Keflex  [Cephalexin]     intolerant  . Medrol [Methylprednisolone] Palpitations   Previous Medications   ACETAMINOPHEN (TYLENOL) 650 MG CR TABLET    Take 2 tablets by mouth daily.   ASPIRIN 81 MG TABLET    Take 1 tablet by mouth daily.   IBUPROFEN (ADVIL,MOTRIN) 800 MG TABLET    TAKE ONE TABLET TWICE DAILY AS NEEDED   METFORMIN (GLUCOPHAGE-XR) 500 MG 24 HR TABLET    Take 2 tablets (1,000 mg total) by mouth daily.   METOPROLOL SUCCINATE (TOPROL-XL) 25 MG 24 HR TABLET    Take 0.5 tablets by mouth daily.   ONE TOUCH ULTRA TEST TEST STRIP    USE UP TO THREE TIMES DAILY AS DIRECTED   QUINAPRIL-HYDROCHLOROTHIAZIDE (ACCURETIC) 20-25 MG PER TABLET    TAKE TWO (2) TABLETS BY MOUTH DAILY   SERTRALINE (ZOLOFT) 25 MG TABLET    TAKE ONE (1) TABLET BY MOUTH EVERY DAY   TRAZODONE (DESYREL) 50 MG TABLET    Take 2-3 at bedtime as needed    Review of Systems  Constitutional: Negative for fever, chills and appetite change.  Respiratory: Negative for chest tightness, shortness of breath and wheezing.   Cardiovascular: Negative for chest pain and palpitations.  Gastrointestinal: Negative for nausea, vomiting and abdominal pain.    Social History  Substance Use Topics  . Smoking status: Never Smoker   . Smokeless tobacco: Not on file  . Alcohol Use: 0.0 oz/week    0 Standard drinks or equivalent per week     Comment: rare  Objective:   BP 120/70 mmHg  Pulse 55  Temp(Src) 98.4 F (36.9 C) (Oral)  Resp 16  Wt 350 lb (158.759 kg)  Physical Exam  General Appearance:    Alert, cooperative, no distress, obese  Eyes:    PERRL, conjunctiva/corneas clear, EOM's intact       Lungs:     Clear to auscultation bilaterally, respirations unlabored  Heart:    Regular rate and rhythm  Neurologic:   Awake, alert, oriented x 3. No apparent focal neurological           defect.       Results for orders placed or performed in visit on  04/30/15  POCT glycosylated hemoglobin (Hb A1C)  Result Value Ref Range   Hemoglobin A1C 6.7    Est. average glucose Bld gHb Est-mCnc 146        Assessment & Plan:     1. Type 2 diabetes mellitus without complication, without long-term current use of insulin (HCC) Well controlled.  Continue current medications.   - POCT glycosylated hemoglobin (Hb A1C)  2. Essential (primary) hypertension Well controlled.  Continue current medications.    Return in about 4 months (around 08/30/2015).       Mila Merryonald Alycia Cooperwood, MD  [facility]

## 2015-05-17 ENCOUNTER — Other Ambulatory Visit: Payer: Self-pay | Admitting: Family Medicine

## 2015-06-23 ENCOUNTER — Encounter: Payer: Self-pay | Admitting: Family Medicine

## 2015-06-23 ENCOUNTER — Ambulatory Visit (INDEPENDENT_AMBULATORY_CARE_PROVIDER_SITE_OTHER): Payer: Managed Care, Other (non HMO) | Admitting: Family Medicine

## 2015-06-23 VITALS — BP 110/70 | HR 58 | Temp 98.4°F | Resp 16

## 2015-06-23 DIAGNOSIS — S161XXA Strain of muscle, fascia and tendon at neck level, initial encounter: Secondary | ICD-10-CM

## 2015-06-23 MED ORDER — METHOCARBAMOL 750 MG PO TABS: 1500.0000 mg | ORAL_TABLET | Freq: Every evening | ORAL | Status: AC | PRN

## 2015-06-23 NOTE — Progress Notes (Signed)
Patient: [name] Male    DOB: 01-07-1971   45 y.o.   MRN: [id number] Visit Date: 06/23/2015  Today's Provider: [name], MD   Chief Complaint  Patient presents with  . Shoulder Pain   Subjective:    Shoulder Pain  The pain is present in the left shoulder and neck. This is a new problem. The current episode started in the past 7 days (3 days ago). There has been no history of extremity trauma. The problem occurs constantly. The problem has been unchanged. The quality of the pain is described as dull and aching. The pain is at a severity of 6/10. The pain is moderate. Associated symptoms include a limited range of motion, numbness, stiffness and tingling. Pertinent negatives include no fever, inability to bear weight, itching, joint locking or joint swelling. The symptoms are aggravated by lying down and activity. He has tried cold, heat, NSAIDS and rest (stretching, ibuprofen, tylenol, heat and ice) for the symptoms. The treatment provided mild relief. His past medical history is significant for diabetes.   Left shoulder pain started 06/21/2015. Neck (left side) started hurting Monday and then his left shoulder began to hurt. Numbness, tingling, pain and stiffness, in neck and shoulder. States he turned his head at one point yesterday and had sudden onset pain left side of neck from shoulder up to ear. Has been applying ice which doesn't help, but heat pad helps some. Taken OTC ibuprofen with minimal relief and tylenol.      Allergies  Allergen Reactions  . Amlodipine Besylate     Constipation, palitations, swelling, shortness of breath  . Augmentin [Amoxicillin-Pot Clavulanate]     intolerant: Causes insomnia  . Cyclobenzaprine   . Keflex  [Cephalexin]     intolerant  . Medrol [Methylprednisolone] Palpitations   Previous Medications   ACETAMINOPHEN (TYLENOL) 650 MG CR TABLET    Take 2 tablets by mouth daily.   ASPIRIN 81 MG TABLET    Take 1 tablet by mouth daily.   IBUPROFEN (ADVIL,MOTRIN) 800 MG TABLET    TAKE ONE TABLET TWICE DAILY AS NEEDED   METFORMIN (GLUCOPHAGE-XR) 500 MG 24 HR TABLET    Take 2 tablets (1,000 mg total) by mouth daily.   METOPROLOL SUCCINATE (TOPROL-XL) 25 MG 24 HR TABLET    Take 0.5 tablets by mouth daily.   ONE TOUCH ULTRA TEST TEST STRIP    USE UP TO THREE TIMES DAILY AS DIRECTED   QUINAPRIL-HYDROCHLOROTHIAZIDE (ACCURETIC) 20-25 MG TABLET    TAKE TWO TABLETS BY MOUTH DAILY   SERTRALINE (ZOLOFT) 25 MG TABLET    TAKE ONE (1) TABLET BY MOUTH EVERY DAY   TRAZODONE (DESYREL) 50 MG TABLET    Take 2-3 at bedtime as needed    Review of Systems  Constitutional: Negative for fever, chills and appetite change.  Respiratory: Negative for chest tightness, shortness of breath and wheezing.   Cardiovascular: Negative for chest pain and palpitations.  Gastrointestinal: Negative for nausea, vomiting and abdominal pain.  Musculoskeletal: Positive for stiffness, neck pain and neck stiffness.  Skin: Negative for itching.  Neurological: Positive for tingling and numbness.    Social History  Substance Use Topics  . Smoking status: Never Smoker   . Smokeless tobacco: Not on file  . Alcohol Use: 0.0 oz/week    0 Standard drinks or equivalent per week     Comment: rare   Objective:   BP 110/70 mmHg  Pulse 58  Temp(Src) 98.4  F (36.9 C) (Oral)  Resp 16  SpO2 96%  Physical Exam  General appearance: alert, well developed, obese, cooperative and in no distress Head: Normocephalic, without obvious abnormality, atraumatic Lungs: Respirations even and unlabored MS: Tender along left trapezius from shoulder to back of neck. Head rotation to right limited to 45 degrees.  FROM of shoulder and UEs without pain Neurologic: Mental status: Alert, oriented to person, place, and time, thought content appropriate.     Assessment & Plan:     1. Neck strain, initial encounter Continue alternating heat/ice. Ibuprofen at bedtime and 1-2 times during  day as needed - methocarbamol (ROBAXIN-750) 750 MG tablet; Take 2 tablets (1,500 mg total) by mouth at bedtime as needed for muscle spasms.  Dispense: 20 tablet; Refill: 0       Mila Merryonald Fisher, MD  [facility]

## 2015-07-03 ENCOUNTER — Other Ambulatory Visit: Payer: Self-pay | Admitting: Family Medicine

## 2015-08-05 ENCOUNTER — Other Ambulatory Visit: Payer: Self-pay | Admitting: Family Medicine

## 2015-08-05 DIAGNOSIS — E119 Type 2 diabetes mellitus without complications: Secondary | ICD-10-CM

## 2015-09-08 ENCOUNTER — Ambulatory Visit: Payer: Managed Care, Other (non HMO) | Admitting: Family Medicine

## 2015-09-10 ENCOUNTER — Encounter: Payer: Self-pay | Admitting: Physician Assistant

## 2015-09-10 ENCOUNTER — Ambulatory Visit: Payer: Self-pay | Admitting: Physician Assistant

## 2015-09-10 VITALS — BP 139/80 | HR 57 | Temp 98.7°F

## 2015-09-10 DIAGNOSIS — M10071 Idiopathic gout, right ankle and foot: Secondary | ICD-10-CM

## 2015-09-10 MED ORDER — INDOMETHACIN 50 MG PO CAPS: 50.0000 mg | ORAL_CAPSULE | Freq: Three times a day (TID) | ORAL | Status: DC

## 2015-09-10 NOTE — Progress Notes (Signed)
   Subjective:Toe pain    Patient ID: [name], male    DOB: September 18, 1970, 45 y.o.   MRN: [id number]  HPI Patient Patient c/o right toe pain/edema for 3 days. No history of trauma. Patient notice redness today.  Review of Systems    DM and Anxiety Objective:   Physical Exam Edematous/erythema right great toe.  Marked guarding at metatarsal head.       Assessment & Plan:Gout  Advised to discontinue Ibuprofen and Start Indocin. Follow up with scheduled PCP appointment next week.

## 2015-09-15 ENCOUNTER — Emergency Department
Admission: EM | Admit: 2015-09-15 | Discharge: 2015-09-16 | Disposition: A | Discharge status: Home / Self Care | Payer: Managed Care, Other (non HMO) | Attending: Emergency Medicine | Admitting: Emergency Medicine

## 2015-09-15 ENCOUNTER — Encounter: Payer: Self-pay | Admitting: Family Medicine

## 2015-09-15 ENCOUNTER — Encounter: Payer: Self-pay | Admitting: Emergency Medicine

## 2015-09-15 ENCOUNTER — Emergency Department: Payer: Managed Care, Other (non HMO)

## 2015-09-15 ENCOUNTER — Ambulatory Visit (INDEPENDENT_AMBULATORY_CARE_PROVIDER_SITE_OTHER): Payer: Managed Care, Other (non HMO) | Admitting: Family Medicine

## 2015-09-15 VITALS — BP 124/82 | HR 54 | Temp 98.7°F | Resp 16

## 2015-09-15 DIAGNOSIS — Z7982 Long term (current) use of aspirin: Secondary | ICD-10-CM | POA: Diagnosis not present

## 2015-09-15 DIAGNOSIS — E119 Type 2 diabetes mellitus without complications: Secondary | ICD-10-CM | POA: Diagnosis not present

## 2015-09-15 DIAGNOSIS — R635 Abnormal weight gain: Secondary | ICD-10-CM | POA: Insufficient documentation

## 2015-09-15 DIAGNOSIS — M1000 Idiopathic gout, unspecified site: Secondary | ICD-10-CM | POA: Diagnosis not present

## 2015-09-15 DIAGNOSIS — I10 Essential (primary) hypertension: Secondary | ICD-10-CM | POA: Diagnosis not present

## 2015-09-15 DIAGNOSIS — Z79899 Other long term (current) drug therapy: Secondary | ICD-10-CM | POA: Insufficient documentation

## 2015-09-15 DIAGNOSIS — Z7984 Long term (current) use of oral hypoglycemic drugs: Secondary | ICD-10-CM | POA: Diagnosis not present

## 2015-09-15 DIAGNOSIS — Z8739 Personal history of other diseases of the musculoskeletal system and connective tissue: Secondary | ICD-10-CM | POA: Insufficient documentation

## 2015-09-15 DIAGNOSIS — M109 Gout, unspecified: Secondary | ICD-10-CM | POA: Insufficient documentation

## 2015-09-15 DIAGNOSIS — Z791 Long term (current) use of non-steroidal anti-inflammatories (NSAID): Secondary | ICD-10-CM | POA: Insufficient documentation

## 2015-09-15 LAB — CBC
HCT: 41.2 % (ref 40.0–52.0)
Hemoglobin: 14.6 g/dL (ref 13.0–18.0)
MCH: 30.6 pg (ref 26.0–34.0)
MCHC: 35.5 g/dL (ref 32.0–36.0)
MCV: 86.3 fL (ref 80.0–100.0)
PLATELETS: 211 10*3/uL (ref 150–440)
RBC: 4.77 MIL/uL (ref 4.40–5.90)
RDW: 13.3 % (ref 11.5–14.5)
WBC: 8.6 10*3/uL (ref 3.8–10.6)

## 2015-09-15 LAB — TROPONIN I

## 2015-09-15 LAB — COMPREHENSIVE METABOLIC PANEL
ALT: 50 U/L (ref 17–63)
AST: 37 U/L (ref 15–41)
Albumin: 4.3 g/dL (ref 3.5–5.0)
Alkaline Phosphatase: 41 U/L (ref 38–126)
Anion gap: 8 (ref 5–15)
BUN: 15 mg/dL (ref 6–20)
CHLORIDE: 105 mmol/L (ref 101–111)
CO2: 27 mmol/L (ref 22–32)
CREATININE: 1.05 mg/dL (ref 0.61–1.24)
Calcium: 9 mg/dL (ref 8.9–10.3)
Glucose, Bld: 133 mg/dL — ABNORMAL HIGH (ref 65–99)
Potassium: 3.6 mmol/L (ref 3.5–5.1)
Sodium: 140 mmol/L (ref 135–145)
Total Bilirubin: 0.8 mg/dL (ref 0.3–1.2)
Total Protein: 7.2 g/dL (ref 6.5–8.1)

## 2015-09-15 LAB — POCT GLYCOSYLATED HEMOGLOBIN (HGB A1C)
ESTIMATED AVERAGE GLUCOSE: 151
HEMOGLOBIN A1C: 6.9

## 2015-09-15 LAB — BRAIN NATRIURETIC PEPTIDE: B NATRIURETIC PEPTIDE 5: 96 pg/mL (ref 0.0–100.0)

## 2015-09-15 LAB — POCT UA - MICROALBUMIN: MICROALBUMIN (UR) POC: 20 mg/L

## 2015-09-15 NOTE — ED Notes (Signed)
Patient transported to X-ray 

## 2015-09-15 NOTE — ED Notes (Signed)
Patient stated he has gained 6 pounds this week, not 4.

## 2015-09-15 NOTE — ED Notes (Signed)
Pt arrived to the ED for complaints of excevessive weight gain (4lbs in 1 week). Pt states that he was prescribed new medications and he believes that they interact with other medications that he was already taking, causing the weight gain. Pt has a teleconsult and he was advised to come to the ED for concern of "kidney damage." Pt is AOx4 in no apparent distress.

## 2015-09-15 NOTE — ED Notes (Signed)
Patient ambulatory to triage with steady gait, without difficulty or distress noted; pt reports dx with gout on Friday and rx indomethacin; has had recent weight gain and states insurance hotline told him to come here with blood tests because he is currently taking quinapril as well; pt denies any c/o

## 2015-09-15 NOTE — Progress Notes (Signed)
Patient: Micheal FriezeBrian S Dawood Male    DOB: [birth year]   [age] y.o.   MRN: [id number] Visit Date: 09/15/2015  Today's Provider: Mila Merryonald Jordain Radin, MD   Chief Complaint  Patient presents with  . Hypertension    follow up   . Diabetes    follow up    Subjective:    HPI  Hypertension, follow-up:  BP Readings from Last 3 Encounters:  09/10/15 139/80  06/23/15 110/70  04/30/15 120/70    He was last seen for hypertension 4 months ago.  BP at that visit was 120/70. Management since that visit includes no changes. He reports good compliance with treatment. He is not having side effects.  He is exercising. He is adherent to low salt diet.   Outside blood pressures are 130/80. He is experiencing none.  Patient denies chest pain, chest pressure/discomfort, claudication, dyspnea, exertional chest pressure/discomfort, fatigue, irregular heart beat, lower extremity edema, near-syncope, orthopnea, palpitations, paroxysmal nocturnal dyspnea, syncope and tachypnea.   Cardiovascular risk factors include hypertension and male gender.  Use of agents associated with hypertension: none.     Weight trend: stable Wt Readings from Last 3 Encounters:  04/30/15 350 lb (158.759 kg)  12/01/14 352 lb (159.666 kg)  04/29/14 350 lb (158.759 kg)    Current diet: in general, a "healthy" diet    ------------------------------------------------------------------------   Diabetes Mellitus Type II, Follow-up:   Lab Results  Component Value Date   HGBA1C 6.7 04/30/2015   HGBA1C 6.4* 12/01/2014   HGBA1C 6.3 09/16/2014    Last seen for diabetes 4 months ago.  Management since then includes no changes. He reports good compliance with treatment. He is having side effects. Metformin causes constipation.  Current symptoms include none and have been stable. Home blood sugar records: fasting range: 160-165  Episodes of hypoglycemia? no   Current Insulin Regimen: none Most Recent Eye Exam: >1  year Weight trend: stable Prior visit with dietician: no Current diet: in general, a "healthy" diet   Current exercise: cardiovascular workout on exercise equipment and weightlifting  Pertinent Labs:    Component Value Date/Time   CHOL 150 12/01/2014 1020   TRIG 121 12/01/2014 1020   HDL 42 12/01/2014 1020   LDLCALC 84 12/01/2014 1020   CREATININE 0.82 07/01/2014 1323   CREATININE 1.07 12/09/2013 0011   CREATININE 0.9 09/03/2013    Wt Readings from Last 3 Encounters:  04/30/15 350 lb (158.759 kg)  12/01/14 352 lb (159.666 kg)  04/29/14 350 lb (158.759 kg)    ------------------------------------------------------------------------  He started back exercising recently and started having toe pain. He went to urgent care and was diagnosed with gout. He was put on indomethacin and states it is greatly improved.     Allergies  Allergen Reactions  . Amlodipine Besylate     Constipation, palitations, swelling, shortness of breath  . Augmentin [Amoxicillin-Pot Clavulanate]     intolerant: Causes insomnia  . Cyclobenzaprine   . Keflex  [Cephalexin]     intolerant  . Medrol [Methylprednisolone] Palpitations   Current Meds  Medication Sig  . acetaminophen (TYLENOL) 650 MG CR tablet Take 2 tablets by mouth daily.  Marland Kitchen. aspirin 81 MG tablet Take 1 tablet by mouth daily.  Marland Kitchen. ibuprofen (ADVIL,MOTRIN) 800 MG tablet TAKE ONE TABLET TWICE DAILY AS NEEDED  . indomethacin (INDOCIN) 50 MG capsule Take 1 capsule (50 mg total) by mouth 3 (three) times daily with meals.  . metFORMIN (GLUCOPHAGE-XR) 500 MG 24 hr tablet TAKE  TWO (2) TABLETS BY MOUTH DAILY  . metoprolol succinate (TOPROL-XL) 25 MG 24 hr tablet TAKE ONE-HALF TABLET BY MOUTH DAILY  . ONE TOUCH ULTRA TEST test strip USE UP TO THREE TIMES DAILY AS DIRECTED  . quinapril-hydrochlorothiazide (ACCURETIC) 20-25 MG tablet TAKE TWO TABLETS BY MOUTH DAILY  . sertraline (ZOLOFT) 25 MG tablet TAKE ONE (1) TABLET BY MOUTH EVERY DAY  . traZODone  (DESYREL) 50 MG tablet Take 2-3 at bedtime as needed    Review of Systems  Constitutional: Negative for fever, chills and appetite change.  Respiratory: Negative for chest tightness, shortness of breath and wheezing.   Cardiovascular: Negative for chest pain and palpitations.  Gastrointestinal: Positive for constipation. Negative for nausea, vomiting and abdominal pain.  Musculoskeletal:       Pain and swelling in big toe of right foot due to gout; improved    Social History  Substance Use Topics  . Smoking status: Never Smoker   . Smokeless tobacco: Not on file  . Alcohol Use: 0.0 oz/week    0 Standard drinks or equivalent per week     Comment: rare   Objective:   BP 124/82 mmHg  Pulse 54  Temp(Src) 98.7 F (37.1 C) (Oral)  Resp 16  Physical Exam  General Appearance:    Alert, cooperative, no distress, morbidly obese  Eyes:    PERRL, conjunctiva/corneas clear, EOM's intact       Lungs:     Clear to auscultation bilaterally, respirations unlabored  Heart:    Regular rate and rhythm  Neurologic:   Awake, alert, oriented x 3. No apparent focal neurological           defect.         Results for orders placed or performed in visit on 09/15/15  POCT HgB A1C  Result Value Ref Range   Hemoglobin A1C 6.9    Est. average glucose Bld gHb Est-mCnc 151   POCT UA - Microalbumin  Result Value Ref Range   Microalbumin Ur, POC 20 mg/L   Creatinine, POC n/a mg/dL   Albumin/Creatinine Ratio, Urine, POC n/a        Assessment & Plan:     1. Type 2 diabetes mellitus without complication, without long-term current use of insulin (HCC) Well controlled. Continue current medications.  Work on increasing exercise as tolerated.  - POCT HgB A1C - POCT UA - Microalbumin - Comprehensive metabolic panel  2. Idiopathic gout, unspecified chronicity, unspecified site Responding well to indomethacin - Uric acid  3. Essential (primary) hypertension Well controlled.  Continue current  medications.  Consider  - Comprehensive metabolic panel       [name], MD  [facility]

## 2015-09-15 NOTE — ED Provider Notes (Signed)
[facility] Emergency Department Provider Note  ____________________________________________  Time seen: 11:45 PM  I have reviewed the triage vital signs and the nursing notes.   HISTORY  Chief Complaint Weight Gain     HPI Micheal FriezeBrian S Wheeler is a 45 y.o. male presents with history of 6 pound weight gain in the last week. Patient states that he spoke with telemetry consult physician who stated that he should proceed to the emergency department secondary to concern for renal failure. Patient has a known history of gout for which she's taking indomethacin as well patient has hypertension for which he takes quinapril and HCTZ. Patient denies any edema no difficulty breathing or difficulty swallowing.    Past Medical History  Diagnosis Date  . History of hydrocele   . Diabetes mellitus without complication (HCC)   . Hypertension     Patient Active Problem List   Diagnosis Date Noted  . Gout 09/15/2015  . Bradycardia 09/16/2014  . Left knee pain 09/16/2014  . Palpitations 09/16/2014  . Panic attacks 09/16/2014  . Tachycardia 09/16/2014  . Arthralgia of hip or thigh 03/21/2009  . Anxiety disorder 07/07/2008  . Disorder of skin 03/18/2008  . Proteinuria 10/22/2006  . Essential (primary) hypertension 06/11/2006  . Obesity, morbid (HCC) 06/11/2006  . Diabetes mellitus, type 2 (HCC) 01/28/2000  . Insomnia 05/03/1999    Past Surgical History  Procedure Laterality Date  . Knee surgery  2006    left knee reconstruction after tibial fracture  . Leg surgery  1975    leg and foot surgery; right lower leg circa    Current Outpatient Rx  Name  Route  Sig  Dispense  Refill  . indomethacin (INDOCIN) 50 MG capsule   Oral   Take 1 capsule (50 mg total) by mouth 3 (three) times daily with meals.   30 capsule   0   . metFORMIN (GLUCOPHAGE-XR) 500 MG 24 hr tablet      TAKE TWO (2) TABLETS BY MOUTH DAILY   60 tablet   12   . metoprolol succinate (TOPROL-XL)  25 MG 24 hr tablet      TAKE ONE-HALF TABLET BY MOUTH DAILY   30 tablet   12   . ONE TOUCH ULTRA TEST test strip      USE UP TO THREE TIMES DAILY AS DIRECTED   100 each   5   . quinapril-hydrochlorothiazide (ACCURETIC) 20-25 MG tablet      TAKE TWO TABLETS BY MOUTH DAILY   60 tablet   12   . sertraline (ZOLOFT) 25 MG tablet   Oral   Take 25 mg by mouth daily. Patient is taking 50mg  every three days without physician's approval         . traZODone (DESYREL) 50 MG tablet      Take 2-3 at bedtime as needed Patient taking differently: Take 50 mg by mouth at bedtime. Take 2-3 at bedtime as needed   60 tablet   3   . aspirin 81 MG tablet   Oral   Take 1 tablet by mouth daily.         Marland Kitchen. ibuprofen (ADVIL,MOTRIN) 800 MG tablet      TAKE ONE TABLET TWICE DAILY AS NEEDED   60 tablet   3     Allergies Amlodipine besylate; Augmentin; Cyclobenzaprine; Keflex ; and Medrol  Family History  Problem Relation Age of Onset  . Hyperlipidemia Mother   . Panic disorder Mother   .  Alcohol abuse Father   . Pancreatic cancer Father   . Stomach cancer Father   . Drug abuse Sister   . Healthy Brother   . Prostate cancer Other   . Lung cancer Other   . Anxiety disorder Other     Social History Social History  Substance Use Topics  . Smoking status: Never Smoker   . Smokeless tobacco: None  . Alcohol Use: 0.0 oz/week    0 Standard drinks or equivalent per week     Comment: rare    Review of Systems  Constitutional: Negative for fever.Positive for 6 pound weight gain Eyes: Negative for visual changes. ENT: Negative for sore throat. Cardiovascular: Negative for chest pain. Respiratory: Negative for shortness of breath. Gastrointestinal: Negative for abdominal pain, vomiting and diarrhea. Genitourinary: Negative for dysuria. Musculoskeletal: Negative for back pain. Skin: Negative for rash. Neurological: Negative for headaches, focal weakness or  numbness.   10-point ROS otherwise negative.  ____________________________________________   PHYSICAL EXAM:  VITAL SIGNS: ED Triage Vitals  Enc Vitals Group     BP 09/15/15 2159 159/94 mmHg     Pulse Rate 09/15/15 2159 54     Resp 09/15/15 2159 18     Temp 09/15/15 2159 99.2 F (37.3 C)     Temp Source 09/15/15 2159 Oral     SpO2 09/15/15 2156 99 %     Weight 09/15/15 2159 357 lb (161.934 kg)     Height 09/15/15 2159 6' (1.829 m)     Head Cir --      Peak Flow --      Pain Score 09/15/15 2200 0     Pain Loc --      Pain Edu? --      Excl. in GC? --      Constitutional: Alert and oriented. Well appearing and in no distress. Eyes: Conjunctivae are normal. PERRL. Normal extraocular movements. ENT   Head: Normocephalic and atraumatic.   Nose: No congestion/rhinnorhea.   Mouth/Throat: Mucous membranes are moist.   Neck: No stridor. Hematological/Lymphatic/Immunilogical: No cervical lymphadenopathy. Cardiovascular: Normal rate, regular rhythm. Normal and symmetric distal pulses are present in all extremities. No murmurs, rubs, or gallops. Respiratory: Normal respiratory effort without tachypnea nor retractions. Breath sounds are clear and equal bilaterally. No wheezes/rales/rhonchi. Gastrointestinal: Soft and nontender. No distention. There is no CVA tenderness. Genitourinary: deferred Musculoskeletal: Nontender with normal range of motion in all extremities. No joint effusions.  No lower extremity tenderness nor edema. Neurologic:  Normal speech and language. No gross focal neurologic deficits are appreciated. Speech is normal.  Skin:  Skin is warm, dry and intact. No rash noted. Psychiatric: Mood and affect are normal. Speech and behavior are normal. Patient exhibits appropriate insight and judgment.  ____________________________________________    LABS (pertinent positives/negatives)  Labs Reviewed  COMPREHENSIVE METABOLIC PANEL - Abnormal; Notable for  the following:    Glucose, Bld 133 (*)    All other components within normal limits  CBC  TROPONIN I  BRAIN NATRIURETIC PEPTIDE  ETHANOL         RADIOLOGY  DG Chest 2 View (Final result) Result time: 09/15/15 22:44:06   Final result by Rad Results In Interface (09/15/15 22:44:06)   Narrative:   CLINICAL DATA: Excessive weight gain, 4 pons in 1 week. Recently started new medication with a potential side effect of CHF.  EXAM: CHEST 2 VIEW  COMPARISON: Radiographs 12/09/2013  FINDINGS: The cardiomediastinal contours are normal. The lungs are clear. Pulmonary vasculature is normal.  No consolidation, pleural effusion, or pneumothorax. No acute osseous abnormalities are seen.  IMPRESSION: No acute process. No evidence of CHF.   Electronically Signed By: Rubye Oaks M.D. On: 09/15/2015 22:44          Procedures     INITIAL IMPRESSION / ASSESSMENT AND PLAN / ED COURSE  Pertinent labs & imaging results that were available during my care of the patient were reviewed by me and considered in my medical decision making (see chart for details).  Consider possibility of renal injury secondary to indomethacin our patient's kidney function is normal with a creatinine of 1.05 GFR greater than 60. In addition consult considered angioedema secondary to ace inhibitors however patient has no edema on exam. I instructed the patient follow up with Dr. Sherrie Mustache his primary care provider today. Patient advised to return to emergency department immediately if any edema difficulty breathing or swallowing  ____________________________________________   FINAL CLINICAL IMPRESSION(S) / ED DIAGNOSES  Final diagnoses:  Weight gain      Darci Current, MD 09/16/15 0020

## 2015-09-16 ENCOUNTER — Telehealth: Payer: Self-pay | Admitting: Family Medicine

## 2015-09-16 ENCOUNTER — Ambulatory Visit (INDEPENDENT_AMBULATORY_CARE_PROVIDER_SITE_OTHER): Payer: Managed Care, Other (non HMO) | Admitting: Family Medicine

## 2015-09-16 ENCOUNTER — Encounter: Payer: Self-pay | Admitting: Family Medicine

## 2015-09-16 VITALS — BP 122/74 | HR 64 | Temp 98.6°F | Resp 16

## 2015-09-16 DIAGNOSIS — R635 Abnormal weight gain: Secondary | ICD-10-CM

## 2015-09-16 DIAGNOSIS — T50905A Adverse effect of unspecified drugs, medicaments and biological substances, initial encounter: Secondary | ICD-10-CM

## 2015-09-16 NOTE — Discharge Instructions (Signed)
Daily Weight Record  It is important to weigh yourself daily. Keep this daily weight chart near your scale. Weigh yourself each morning at the same time. Weigh yourself without shoes, and wear the same amount of clothing each day. Compare today's weight to yesterday's weight. Bring this form with you to your follow-up appointments.  Call your health care provider if you have concerns about your weight, including rapid weight gain or rapid weight loss.  Date: ________ Weight: ____________________ Date: ________ Weight: ____________________  Date: ________ Weight: ____________________ Date: ________ Weight: ____________________  Date: ________ Weight: ____________________ Date: ________ Weight: ____________________  Date: ________ Weight: ____________________ Date: ________ Weight: ____________________  Date: ________ Weight: ____________________ Date: ________ Weight: ____________________  Date: ________ Weight: ____________________ Date: ________ Weight: ____________________  Date: ________ Weight: ____________________ Date: ________ Weight: ____________________  Date: ________ Weight: ____________________ Date: ________ Weight: ____________________  Date: ________ Weight: ____________________ Date: ________ Weight: ____________________  Date: ________ Weight: ____________________ Date: ________ Weight: ____________________  Date: ________ Weight: ____________________ Date: ________ Weight: ____________________  Date: ________ Weight: ____________________ Date: ________ Weight: ____________________  Date: ________ Weight: ____________________ Date: ________ Weight: ____________________  Date: ________ Weight: ____________________ Date: ________ Weight: ____________________  Date: ________ Weight: ____________________ Date: ________ Weight: ____________________  Date: ________ Weight: ____________________ Date: ________ Weight: ____________________  Date: ________ Weight: ____________________ Date: ________ Weight:  ____________________  Date: ________ Weight: ____________________ Date: ________ Weight: ____________________  Date: ________ Weight: ____________________ Date: ________ Weight: ____________________  Date: ________ Weight: ____________________ Date: ________ Weight: ____________________  Date: ________ Weight: ____________________ Date: ________ Weight: ____________________  Date: ________ Weight: ____________________ Date: ________ Weight: ____________________  Date: ________ Weight: ____________________ Date: ________ Weight: ____________________  Date: ________ Weight: ____________________ Date: ________ Weight: ____________________  Date: ________ Weight: ____________________ Date: ________ Weight: ____________________     This information is not intended to replace advice given to you by your health care provider. Make sure you discuss any questions you have with your health care provider.     Document Released: 04/27/2006 Document Revised: 03/06/2014 Document Reviewed: 09/12/2013  Elsevier Interactive Patient Education ©2016 [employer]

## 2015-09-16 NOTE — Progress Notes (Signed)
Patient: [name] Male    DOB: 23-Aug-1970   45 y.o.   MRN: [id number] Visit Date: 09/16/2015  Today's Provider: [name], MD   Chief Complaint  Patient presents with  . Hospitalization Follow-up   Subjective:    HPI   Follow up ER visit  Patient was seen in ER for Weight Gain on 09/15/2015. Called [facility] and [facility] after he weighed himself yesterday evening and found to have gained 5-6 pounds since last week before starting indomethacin, three times a day the last five days. He states he feels perfectly normal otherwise. He reports gout has cleared completely.  Kdney function at ER were normal with a creatinine of 1.05 GFR greater than 60. He states weight is down this morning by about 3 pounds from yesterday.  ----------------------------------------------------------------------    Allergies  Allergen Reactions  . Amlodipine Besylate     Constipation, palitations, swelling, shortness of breath  . Augmentin [Amoxicillin-Pot Clavulanate]     intolerant: Causes insomnia  . Cyclobenzaprine   . Keflex  [Cephalexin]     intolerant  . Medrol [Methylprednisolone] Palpitations   Current Meds  Medication Sig  . aspirin 81 MG tablet Take 1 tablet by mouth daily.  Marland Kitchen ibuprofen (ADVIL,MOTRIN) 800 MG tablet TAKE ONE TABLET TWICE DAILY AS NEEDED  . indomethacin (INDOCIN) 50 MG capsule Take 1 capsule (50 mg total) by mouth 3 (three) times daily with meals.  . metFORMIN (GLUCOPHAGE-XR) 500 MG 24 hr tablet TAKE TWO (2) TABLETS BY MOUTH DAILY  . metoprolol succinate (TOPROL-XL) 25 MG 24 hr tablet TAKE ONE-HALF TABLET BY MOUTH DAILY  . ONE TOUCH ULTRA TEST test strip USE UP TO THREE TIMES DAILY AS DIRECTED  . quinapril-hydrochlorothiazide (ACCURETIC) 20-25 MG tablet TAKE TWO TABLETS BY MOUTH DAILY  . sertraline (ZOLOFT) 25 MG tablet Take 25 mg by mouth daily. Patient is taking  every three days without physician's approval  . traZODone (DESYREL) 50 MG tablet Take  2-3 at bedtime as needed (Patient taking differently: Take 50 mg by mouth at bedtime. Take 2-3 at bedtime as needed)    Review of Systems  Constitutional: Negative for fever, chills and appetite change.  Respiratory: Negative for chest tightness, shortness of breath and wheezing.   Cardiovascular: Negative for chest pain and palpitations.  Gastrointestinal: Negative for nausea, vomiting and abdominal pain.    Social History  Substance Use Topics  . Smoking status: Never Smoker   . Smokeless tobacco: Not on file  . Alcohol Use: 0.0 oz/week    0 Standard drinks or equivalent per week     Comment: rare   Objective:   BP 122/74 mmHg  Pulse 64  Temp(Src) 98.6 F (37 C) (Oral)  Resp 16  Wt   SpO2 96%  Physical Exam  General appearance: alert, well developed, well nourished, cooperative and in no distress, obese Head: Normocephalic, without obvious abnormality, atraumatic Respiratory: Respirations even and unlabored, normal respiratory rate Extremities: Trace bipedal edema.  Skin: Skin color, texture, turgor normal. No rashes seen  Neurologic: Mental status: Alert, oriented to person, place, and time, thought content appropriate.       Assessment & Plan:     1. Weight gain due to medication No sign of acute kidney injury or angioedema. Likely small amount of fluid retention for NSAID. Weight is nearly back to baseline today. If we need to start uric acid lowering medication of if gout returns will use colchicine instead of NSAID. Will  likely need to reduce hctz.        Mila Merryonald Fisher, MD  [facility]

## 2015-09-16 NOTE — ED Notes (Signed)
MD at bedside. 

## 2015-09-16 NOTE — Telephone Encounter (Signed)
[facility]2015 for all over weight gain.  Pt needs to discuss changing his medications.  I have schedule a appointment for today per Michelle/MW

## 2015-09-17 LAB — COMPREHENSIVE METABOLIC PANEL
ALBUMIN: 4 g/dL (ref 3.5–5.5)
ALT: 47 IU/L — ABNORMAL HIGH (ref 0–44)
AST: 34 IU/L (ref 0–40)
Albumin/Globulin Ratio: 1.5 (ref 1.2–2.2)
Alkaline Phosphatase: 42 IU/L (ref 39–117)
BUN / CREAT RATIO: 13 (ref 9–20)
BUN: 12 mg/dL (ref 6–24)
Bilirubin Total: 0.7 mg/dL (ref 0.0–1.2)
CALCIUM: 8.9 mg/dL (ref 8.7–10.2)
CO2: 25 mmol/L (ref 18–29)
CREATININE: 0.9 mg/dL (ref 0.76–1.27)
Chloride: 100 mmol/L (ref 96–106)
GFR, EST AFRICAN AMERICAN: 120 mL/min/{1.73_m2} (ref >59)
GFR, EST NON AFRICAN AMERICAN: 104 mL/min/{1.73_m2} (ref >59)
GLOBULIN, TOTAL: 2.6 g/dL (ref 1.5–4.5)
GLUCOSE: 142 mg/dL — AB (ref 65–99)
Potassium: 4.2 mmol/L (ref 3.5–5.2)
Sodium: 143 mmol/L (ref 134–144)
TOTAL PROTEIN: 6.6 g/dL (ref 6.0–8.5)

## 2015-09-17 LAB — URIC ACID: Uric Acid: 9.6 mg/dL — ABNORMAL HIGH (ref 3.7–8.6)

## 2015-09-28 ENCOUNTER — Other Ambulatory Visit: Payer: Self-pay | Admitting: *Deleted

## 2015-09-28 MED ORDER — QUINAPRIL-HYDROCHLOROTHIAZIDE 20-12.5 MG PO TABS: 1.0000 | ORAL_TABLET | Freq: Two times a day (BID) | ORAL | 5 refills | Status: DC

## 2015-09-28 NOTE — Telephone Encounter (Signed)
Rx sent to pharmacy   

## 2015-10-15 ENCOUNTER — Other Ambulatory Visit: Payer: Self-pay | Admitting: Family Medicine

## 2015-10-15 DIAGNOSIS — G4700 Insomnia, unspecified: Secondary | ICD-10-CM

## 2015-11-26 ENCOUNTER — Other Ambulatory Visit: Payer: Self-pay | Admitting: Family Medicine

## 2015-12-10 ENCOUNTER — Ambulatory Visit (INDEPENDENT_AMBULATORY_CARE_PROVIDER_SITE_OTHER): Payer: Managed Care, Other (non HMO) | Admitting: Family Medicine

## 2015-12-10 ENCOUNTER — Encounter: Payer: Self-pay | Admitting: Family Medicine

## 2015-12-10 VITALS — BP 136/76 | HR 62 | Temp 99.4°F | Resp 20

## 2015-12-10 DIAGNOSIS — L02214 Cutaneous abscess of groin: Secondary | ICD-10-CM | POA: Diagnosis not present

## 2015-12-10 MED ORDER — DOXYCYCLINE HYCLATE 100 MG PO TABS: 100.0000 mg | ORAL_TABLET | Freq: Two times a day (BID) | ORAL | 0 refills | Status: DC

## 2015-12-10 NOTE — Progress Notes (Signed)
Patient: Micheal FriezeBrian S Raupp Male    DOB: 1970-07-24   45 y.o.   MRN: [id number] Visit Date: 12/10/2015  Today's Provider: Mila Merryonald Mirl Hillery, MD   Chief Complaint  Patient presents with  . Cyst   Subjective:    HPI Patient reports that he has a dime sized cyst located in his right groin area. He reports that he has been trying warm compresses which has helped a little. Patient reports that he has had symptoms X 3 days. He has also used neosporin on the cyst.      Allergies  Allergen Reactions  . Amlodipine Besylate     Constipation, palitations, swelling, shortness of breath  . Augmentin [Amoxicillin-Pot Clavulanate]     intolerant: Causes insomnia  . Cyclobenzaprine   . Keflex  [Cephalexin]     intolerant  . Medrol [Methylprednisolone] Palpitations     Current Outpatient Prescriptions:  .  aspirin 81 MG tablet, Take 1 tablet by mouth daily., Disp: , Rfl:  .  ibuprofen (ADVIL,MOTRIN) 800 MG tablet, TAKE ONE TABLET TWICE DAILY AS NEEDED, Disp: 60 tablet, Rfl: 3 .  indomethacin (INDOCIN) 50 MG capsule, Take 1 capsule (50 mg total) by mouth 3 (three) times daily with meals., Disp: 30 capsule, Rfl: 0 .  metFORMIN (GLUCOPHAGE-XR) 500 MG 24 hr tablet, TAKE TWO (2) TABLETS BY MOUTH DAILY, Disp: 60 tablet, Rfl: 12 .  metoprolol succinate (TOPROL-XL) 25 MG 24 hr tablet, TAKE ONE-HALF TABLET BY MOUTH DAILY, Disp: 30 tablet, Rfl: 12 .  ONE TOUCH ULTRA TEST test strip, USE UP TO THREE TIMES DAILY AS DIRECTED, Disp: 100 each, Rfl: 5 .  quinapril-hydrochlorothiazide (ACCURETIC) 20-12.5 MG tablet, Take 1 tablet by mouth 2 (two) times daily., Disp: 60 tablet, Rfl: 5 .  sertraline (ZOLOFT) 25 MG tablet, TAKE ONE (1) TABLET BY MOUTH EVERY DAY, Disp: 30 tablet, Rfl: 12 .  traZODone (DESYREL) 50 MG tablet, TAKE 2 TO 3 TABLETS BY MOUTH AT BEDTIME, Disp: 60 tablet, Rfl: 12  Review of Systems  Constitutional: Negative for activity change, appetite change, chills, diaphoresis, fatigue, fever and  unexpected weight change.  Respiratory: Negative.   Cardiovascular: Negative.   Skin: Positive for wound.    Social History  Substance Use Topics  . Smoking status: Never Smoker  . Smokeless tobacco: Not on file  . Alcohol use 0.0 oz/week     Comment: rare   Objective:   BP 136/76 (BP Location: Left Arm, Patient Position: Sitting, Cuff Size: Large)   Pulse 62   Temp 99.4 F (37.4 C)   Resp 20   Physical Exam  Integ: firm tender subcutaneous nodule right inguinal area with red inflamed overlying integument. Scant amount serous discharge.     Assessment & Plan:      1. Abscess of groin, right  - doxycycline (VIBRA-TABS) 100 MG tablet; Take 1 tablet (100 mg total) by mouth 2 (two) times daily.  Dispense: 20 tablet; Refill: 0 - Wound culture  Call if symptoms change or if not rapidly improving.    He also reports he has three toes that have started feeling numb off and on. Recommend he start 1000mg  B12 daily. Will reassess when he returns for diabetic follow up.     The entirety of the information documented in the History of Present Illness, Review of Systems and Physical Exam were personally obtained by me. Portions of this information were initially documented by Anson [place]achelle Presley, CMA and reviewed by me for thoroughness and  accuracy.    Lelon Huh, MD  [facility]

## 2015-12-14 ENCOUNTER — Telehealth: Payer: Self-pay

## 2015-12-14 LAB — WOUND CULTURE: Organism ID, Bacteria: NONE SEEN

## 2015-12-14 MED ORDER — SULFAMETHOXAZOLE-TRIMETHOPRIM 800-160 MG PO TABS: 1.0000 | ORAL_TABLET | Freq: Two times a day (BID) | ORAL | 0 refills | Status: DC

## 2015-12-14 NOTE — Telephone Encounter (Signed)
-----   Message from Malva Limesonald E Fisher, MD sent at 12/14/2015  7:42 AM EDT ----- Cultures are positive for infection with Proteus, which is resistant to doxycycline. He should change antibiotic to Septra DS one tablet twice a day for 10days.

## 2015-12-14 NOTE — Telephone Encounter (Signed)
Advised patient of results. Discontinued Doxy and sent in Septra DS into the patient's pharmacy.

## 2015-12-17 ENCOUNTER — Telehealth: Payer: Self-pay

## 2015-12-17 NOTE — Telephone Encounter (Signed)
Patient was seen in the office on 12/10/2015 for evaluation of Abcess of the groin. Patient was started in Doxycycline and was later changed to septra due to the infection resistant to Doxy. Today patient called stating that he stopped taking the B12 and Doxycycline 2 days ago because he started having itching and red spots on his stomach and leg. Patient states the infection that he was seen for in the office as improved and he has not started the Septra . Patient was advised per Dr. Sherrie MustacheFisher that he should take 180mg  Allegra daily to clear up the allergic reaction and if symptoms don't improve by the weekend to call back. Patient advised and verbally voiced understanding.

## 2016-01-12 ENCOUNTER — Encounter: Payer: Self-pay | Admitting: Family Medicine

## 2016-01-12 ENCOUNTER — Ambulatory Visit (INDEPENDENT_AMBULATORY_CARE_PROVIDER_SITE_OTHER): Payer: Managed Care, Other (non HMO) | Admitting: Family Medicine

## 2016-01-12 VITALS — BP 148/96 | HR 56 | Temp 99.3°F | Resp 16 | Wt 347.0 lb

## 2016-01-12 DIAGNOSIS — J069 Acute upper respiratory infection, unspecified: Secondary | ICD-10-CM | POA: Diagnosis not present

## 2016-01-12 DIAGNOSIS — E119 Type 2 diabetes mellitus without complications: Secondary | ICD-10-CM | POA: Diagnosis not present

## 2016-01-12 DIAGNOSIS — I10 Essential (primary) hypertension: Secondary | ICD-10-CM | POA: Diagnosis not present

## 2016-01-12 DIAGNOSIS — E790 Hyperuricemia without signs of inflammatory arthritis and tophaceous disease: Secondary | ICD-10-CM | POA: Insufficient documentation

## 2016-01-12 LAB — POCT GLYCOSYLATED HEMOGLOBIN (HGB A1C)
Est. average glucose Bld gHb Est-mCnc: 146
HEMOGLOBIN A1C: 6.7

## 2016-01-12 NOTE — Progress Notes (Signed)
Patient: [name] Male    DOB: 02/10/1971   45 y.o.   MRN: [id number] Visit Date: 01/12/2016  Today's Provider: [name], MD   Chief Complaint  Patient presents with  . Diabetes    follow up  . Hypertension    follow up  . Gout    follow up   Subjective:    HPI  Diabetes Mellitus Type II, Follow-up:   Lab Results  Component Value Date   HGBA1C 6.9 09/15/2015   HGBA1C 6.7 04/30/2015   HGBA1C 6.4 (H) 12/01/2014    Last seen for diabetes 4 months ago.  Management since then includes continue same medications.Marland Kitchen He reports good compliance with treatment. He is not having side effects.  Current symptoms include paresthesia of the feet and polyuria and have been stable. Home blood sugar records: postprandial range: 150-200 fasting's:160  Episodes of hypoglycemia? no   Current Insulin Regimen: none Most Recent Eye Exam: >1 year Weight trend: decreasing steadily Prior visit with dietician: no Current diet: well balanced Current exercise: aerobics  Pertinent Labs:    Component Value Date/Time   CHOL 150 12/01/2014 1020   TRIG 121 12/01/2014 1020   HDL 42 12/01/2014 1020   LDLCALC 84 12/01/2014 1020   CREATININE 0.90 09/16/2015 0825   CREATININE 1.07 12/09/2013 0011    Wt Readings from Last 3 Encounters:  01/12/16 (!) 347 lb (157.4 kg)  09/15/15 (!) 357 lb (161.9 kg)  04/30/15 (!) 350 lb (158.8 kg)    ------------------------------------------------------------------------  Hypertension, follow-up:  BP Readings from Last 3 Encounters:  01/12/16 (!) 148/96  12/10/15 136/76  09/16/15 122/74    He was last seen for hypertension 4 months ago.  BP at that visit was 124/82. Management since that visit includes reducing quinapril due to gout and hyperuricemia He reports good compliance with treatment. He is not having side effects.  He is exercising. He is adherent to low salt diet.   Outside blood pressures are not checked at home. He  is experiencing none.  Patient denies chest pain, chest pressure/discomfort, claudication, dyspnea, exertional chest pressure/discomfort, fatigue, irregular heart beat, lower extremity edema, near-syncope, orthopnea, palpitations, paroxysmal nocturnal dyspnea, syncope and tachypnea.   Cardiovascular risk factors include diabetes mellitus, hypertension, male gender and obesity (BMI >= 30 kg/m2).  Use of agents associated with hypertension: none.     Weight trend: decreasing steadily Wt Readings from Last 3 Encounters:  01/12/16 (!) 347 lb (157.4 kg)  09/15/15 (!) 357 lb (161.9 kg)  04/30/15 (!) 350 lb (158.8 kg)    Current diet: well balanced   He states that he hadn't been checking BP until the last couple of days when he starting having cold symptoms and generally malaise, so he decided to check his BP, which has been elevated. Denies taking any OTC cold medication or NSAIDs.   ------------------------------------------------------------------------ Follow up Gout: Patient was last seen for this problem 4 months ago. Changes made includes reducing the dose of Accuretic to 2x 20-12.5mg  daily. Patient reports good compliance with treatment, good tolerance and good symptom control.     Allergies  Allergen Reactions  . Amlodipine Besylate     Constipation, palitations, swelling, shortness of breath  . Augmentin [Amoxicillin-Pot Clavulanate]     intolerant: Causes insomnia  . Bactrim [Sulfamethoxazole-Trimethoprim] Hives    rash  . Cyclobenzaprine   . Indomethacin Swelling    Caused Fluid retention  . Keflex  [Cephalexin]     intolerant  .  Medrol [Methylprednisolone] Palpitations     Current Outpatient Prescriptions:  .  ibuprofen (ADVIL,MOTRIN) 800 MG tablet, TAKE ONE TABLET TWICE DAILY AS NEEDED, Disp: 60 tablet, Rfl: 3 .  metFORMIN (GLUCOPHAGE-XR) 500 MG 24 hr tablet, TAKE TWO (2) TABLETS BY MOUTH DAILY, Disp: 60 tablet, Rfl: 12 .  metoprolol succinate (TOPROL-XL) 25 MG 24 hr  tablet, TAKE ONE-HALF TABLET BY MOUTH DAILY, Disp: 30 tablet, Rfl: 12 .  ONE TOUCH ULTRA TEST test strip, USE UP TO THREE TIMES DAILY AS DIRECTED, Disp: 100 each, Rfl: 5 .  quinapril-hydrochlorothiazide (ACCURETIC) 20-12.5 MG tablet, Take 1 tablet by mouth 2 (two) times daily., Disp: 60 tablet, Rfl: 5 .  sertraline (ZOLOFT) 25 MG tablet, TAKE ONE (1) TABLET BY MOUTH EVERY DAY, Disp: 30 tablet, Rfl: 12 .  traZODone (DESYREL) 50 MG tablet, TAKE 2 TO 3 TABLETS BY MOUTH AT BEDTIME, Disp: 60 tablet, Rfl: 12 .  vitamin B-12 (CYANOCOBALAMIN) 1000 MCG tablet, Take 1,000 mcg by mouth daily., Disp: , Rfl:  .  aspirin 81 MG tablet, Take 1 tablet by mouth daily., Disp: , Rfl:   Review of Systems  Constitutional: Negative for appetite change, chills and fever.  HENT: Positive for postnasal drip and sinus pressure.   Respiratory: Positive for cough (occasional mucus production). Negative for chest tightness, shortness of breath and wheezing.   Cardiovascular: Negative for chest pain and palpitations.  Gastrointestinal: Negative for abdominal pain, nausea and vomiting.    Social History  Substance Use Topics  . Smoking status: Never Smoker  . Smokeless tobacco: Never Used  . Alcohol use 0.0 oz/week     Comment: rare   Objective:   BP (!) 148/96 (BP Location: Left Arm, Patient Position: Sitting, Cuff Size: Large)   Pulse (!) 56   Temp 99.3 F (37.4 C) (Oral)   Resp 16   Wt (!) 347 lb (157.4 kg)   SpO2 98% Comment: room air  BMI 47.06 kg/m   Physical Exam  General Appearance:    Alert, cooperative, no distress, morbidly obese  HENT:   bilateral TM normal without fluid or infection, neck has bilateral anterior cervical nodes enlarged, pharynx erythematous without exudate, sinuses nontender, post nasal drip noted and nasal mucosa pale and congested  Eyes:    PERRL, conjunctiva/corneas clear, EOM's intact       Lungs:     Clear to auscultation bilaterally, respirations unlabored  Heart:     Regular rate and rhythm  Neurologic:   Awake, alert, oriented x 3. No apparent focal neurological           defect.       Results for orders placed or performed in visit on 01/12/16  POCT HgB A1C  Result Value Ref Range   Hemoglobin A1C 6.7    Est. average glucose Bld gHb Est-mCnc 146         Assessment & Plan:      1. Type 2 diabetes mellitus without complication, without long-term current use of insulin (HCC) Well controlled.  Continue current medications.   - POCT HgB A1C - Lipid panel  2. Hyperuricemia No gout flares since reducing dose of hctz.  - Uric acid  3. Upper respiratory tract infection, unspecified type Call if symptoms change or if not rapidly improving.   4. Hypertension BP up since reducing dose of Quinipril-hctz due to gout. However he has also been feeling ill with cold symptoms the last couple of days. He is going to start check BP every  day and call if it remains over 140/90 once he is over his cold. Otherwise follow up 3-4 months.     The entirety of the information documented in the History of Present Illness, Review of Systems and Physical Exam were personally obtained by me. Portions of this information were initially documented by Awilda Billoshena Chambers, CMA and reviewed by me for thoroughness and accuracy.   Mila Merryonald Elleni Mozingo, MD  [facility]

## 2016-01-12 NOTE — Patient Instructions (Signed)
Call the office and let me now if you BP remains >140/90 once you have recovered from your upper respiratory infection   Upper Respiratory Infection, Adult Most upper respiratory infections (URIs) are a viral infection of the air passages leading to the lungs. A URI affects the nose, throat, and upper air passages. The most common type of URI is nasopharyngitis and is typically referred to as "the common cold." URIs run their course and usually go away on their own. Most of the time, a URI does not require medical attention, but sometimes a bacterial infection in the upper airways can follow a viral infection. This is called a secondary infection. Sinus and middle ear infections are common types of secondary upper respiratory infections. Bacterial pneumonia can also complicate a URI. A URI can worsen asthma and chronic obstructive pulmonary disease (COPD). Sometimes, these complications can require emergency medical care and may be life threatening. What are the causes? Almost all URIs are caused by viruses. A virus is a type of germ and can spread from one person to another. What increases the risk? You may be at risk for a URI if:  You smoke.  You have chronic heart or lung disease.  You have a weakened defense (immune) system.  You are very young or very old.  You have nasal allergies or asthma.  You work in crowded or poorly ventilated areas.  You work in health care facilities or schools. What are the signs or symptoms? Symptoms typically develop 2-3 days after you come in contact with a cold virus. Most viral URIs last 7-10 days. However, viral URIs from the influenza virus (flu virus) can last 14-18 days and are typically more severe. Symptoms may include:  Runny or stuffy (congested) nose.  Sneezing.  Cough.  Sore throat.  Headache.  Fatigue.  Fever.  Loss of appetite.  Pain in your forehead, behind your eyes, and over your cheekbones (sinus pain).  Muscle  aches. How is this diagnosed? Your health care provider may diagnose a URI by:  Physical exam.  Tests to check that your symptoms are not due to another condition such as:  Strep throat.  Sinusitis.  Pneumonia.  Asthma. How is this treated? A URI goes away on its own with time. It cannot be cured with medicines, but medicines may be prescribed or recommended to relieve symptoms. Medicines may help:  Reduce your fever.  Reduce your cough.  Relieve nasal congestion. Follow these instructions at home:  Take medicines only as directed by your health care provider.  Gargle warm saltwater or take cough drops to comfort your throat as directed by your health care provider.  Use a warm mist humidifier or inhale steam from a shower to increase air moisture. This may make it easier to breathe.  Drink enough fluid to keep your urine clear or pale yellow.  Eat soups and other clear broths and maintain good nutrition.  Rest as needed.  Return to work when your temperature has returned to normal or as your health care provider advises. You may need to stay home longer to avoid infecting others. You can also use a face mask and careful hand washing to prevent spread of the virus.  Increase the usage of your inhaler if you have asthma.  Do not use any tobacco products, including cigarettes, chewing tobacco, or electronic cigarettes. If you need help quitting, ask your health care provider. How is this prevented? The best way to protect yourself from getting a cold  is to practice good hygiene.  Avoid oral or hand contact with people with cold symptoms.  Wash your hands often if contact occurs. There is no clear evidence that vitamin C, vitamin E, echinacea, or exercise reduces the chance of developing a cold. However, it is always recommended to get plenty of rest, exercise, and practice good nutrition. Contact a health care provider if:  You are getting worse rather than  better.  Your symptoms are not controlled by medicine.  You have chills.  You have worsening shortness of breath.  You have brown or red mucus.  You have yellow or brown nasal discharge.  You have pain in your face, especially when you bend forward.  You have a fever.  You have swollen neck glands.  You have pain while swallowing.  You have white areas in the back of your throat. Get help right away if:  You have severe or persistent:  Headache.  Ear pain.  Sinus pain.  Chest pain.  You have chronic lung disease and any of the following:  Wheezing.  Prolonged cough.  Coughing up blood.  A change in your usual mucus.  You have a stiff neck.  You have changes in your:  Vision.  Hearing.  Thinking.  Mood. This information is not intended to replace advice given to you by your health care provider. Make sure you discuss any questions you have with your health care provider. Document Released: 08/09/2000 Document Revised: 10/17/2015 Document Reviewed: 05/21/2013 Elsevier Interactive Patient Education  2017 [employer]

## 2016-01-13 LAB — LIPID PANEL
CHOLESTEROL TOTAL: 159 mg/dL (ref 100–199)
Chol/HDL Ratio: 3.4 ratio units (ref 0.0–5.0)
HDL: 47 mg/dL (ref >39)
LDL CALC: 90 mg/dL (ref 0–99)
TRIGLYCERIDES: 110 mg/dL (ref 0–149)
VLDL CHOLESTEROL CAL: 22 mg/dL (ref 5–40)

## 2016-01-13 LAB — URIC ACID: Uric Acid: 9.1 mg/dL — ABNORMAL HIGH (ref 3.7–8.6)

## 2016-01-23 ENCOUNTER — Other Ambulatory Visit: Payer: Self-pay | Admitting: Family Medicine

## 2016-02-07 ENCOUNTER — Ambulatory Visit (INDEPENDENT_AMBULATORY_CARE_PROVIDER_SITE_OTHER): Payer: Managed Care, Other (non HMO) | Admitting: Family Medicine

## 2016-02-07 ENCOUNTER — Encounter: Payer: Self-pay | Admitting: Family Medicine

## 2016-02-07 VITALS — BP 126/84 | HR 63 | Temp 98.3°F | Resp 17 | Wt 345.0 lb

## 2016-02-07 DIAGNOSIS — B9789 Other viral agents as the cause of diseases classified elsewhere: Secondary | ICD-10-CM

## 2016-02-07 DIAGNOSIS — J069 Acute upper respiratory infection, unspecified: Secondary | ICD-10-CM

## 2016-02-07 NOTE — Patient Instructions (Signed)
Discussed use of Mucinex D for congestion, Delsym for cough, and Benadryl for postnasal drainage 

## 2016-02-07 NOTE — Progress Notes (Signed)
Subjective:     Patient ID: Madolyn FriezeBrian S Cunningham, male   DOB: 09/25/1970, 45 y.o.   MRN: [id number]  HPI  Chief Complaint  Patient presents with  . Cough    Patient comes in office today with concerns of cough and congestion for the past two days. Patient states that his cough is productive of green/yellowish sputum and he complains of wheezing. Patient reports taking otc Allegra and Advil.   Reports cold sx. Has been taking Allegra. + flu shot.Accompanied by his wife.   Review of Systems     Objective:   Physical Exam  Constitutional: He appears well-developed and well-nourished. No distress.  Ears: T.M's intact without inflammation Sinuses: non-tender Throat: no tonsillar enlargement or exudate, mild posterior pharyngeal erythema Neck: no cervical adenopathy Lungs: clear     Assessment:    1. Viral upper respiratory tract infection    Plan:    discussed otc treatment.

## 2016-02-17 ENCOUNTER — Encounter: Payer: Self-pay | Admitting: Family Medicine

## 2016-02-17 ENCOUNTER — Ambulatory Visit (INDEPENDENT_AMBULATORY_CARE_PROVIDER_SITE_OTHER): Payer: Managed Care, Other (non HMO) | Admitting: Family Medicine

## 2016-02-17 VITALS — BP 140/90 | HR 62 | Temp 98.7°F | Resp 16

## 2016-02-17 DIAGNOSIS — J069 Acute upper respiratory infection, unspecified: Secondary | ICD-10-CM | POA: Diagnosis not present

## 2016-02-17 MED ORDER — AMOXICILLIN 500 MG PO CAPS: 1000.0000 mg | ORAL_CAPSULE | Freq: Two times a day (BID) | ORAL | 0 refills | Status: AC

## 2016-02-17 NOTE — Progress Notes (Signed)
Patient: [name] Male    DOB: [birth year]   45 y.o.   MRN: [id number] Visit Date: 02/17/2016  Today's Provider: Mila Merryonald Haru Anspaugh, MD   Chief Complaint  Patient presents with  . Cough    x 2 weeks  . Hyperglycemia   Subjective:    Cough  This is a recurrent problem. Episode onset: 2 weeks ago. The problem has been gradually improving. The cough is productive of sputum. Associated symptoms include ear congestion, ear pain (right ear), nasal congestion, postnasal drip, rhinorrhea (improved), weight loss and wheezing. Pertinent negatives include no chest pain, chills, fever, headaches, hemoptysis, myalgias, sore throat or shortness of breath. Treatments tried: Tessalon Pearls. The treatment provided mild relief.  Hyperglycemia  Associated symptoms include congestion and coughing (productive; pale yellow). Pertinent negatives include no abdominal pain, chest pain, chills, diaphoresis, fatigue, fever, headaches, myalgias, nausea, sore throat or vomiting.  Patient was prescribed Tessalon Pearls 4 days by [employer] (through his insurance) and reports this has improved his cough.  Patient states his blood sugars have been elevated since he has been sick. His blood sugars have been running 215-220.       Allergies  Allergen Reactions  . Amlodipine Besylate     Constipation, palitations, swelling, shortness of breath  . Augmentin [Amoxicillin-Pot Clavulanate]     intolerant: Causes insomnia  . Bactrim [Sulfamethoxazole-Trimethoprim] Hives    rash  . Cyclobenzaprine   . Indomethacin Swelling    Caused Fluid retention  . Keflex  [Cephalexin]     intolerant  . Medrol [Methylprednisolone] Palpitations     Current Outpatient Prescriptions:  .  benzonatate (TESSALON) 200 MG capsule, Take 200 mg by mouth 3 (three) times daily as needed for cough., Disp: , Rfl:  .  ibuprofen (ADVIL,MOTRIN) 800 MG tablet, TAKE ONE TABLET TWICE DAILY AS NEEDED, Disp: 60 tablet, Rfl: 3 .  metFORMIN  (GLUCOPHAGE-XR) 500 MG 24 hr tablet, TAKE TWO (2) TABLETS BY MOUTH DAILY, Disp: 60 tablet, Rfl: 12 .  metoprolol succinate (TOPROL-XL) 25 MG 24 hr tablet, TAKE ONE-HALF TABLET BY MOUTH DAILY, Disp: 30 tablet, Rfl: 12 .  ONE TOUCH ULTRA TEST test strip, USE UP TO 3 TIMES DAILY AS DIRECTED, Disp: 100 each, Rfl: 5 .  quinapril-hydrochlorothiazide (ACCURETIC) 20-12.5 MG tablet, Take 1 tablet by mouth 2 (two) times daily., Disp: 60 tablet, Rfl: 5 .  sertraline (ZOLOFT) 25 MG tablet, TAKE ONE (1) TABLET BY MOUTH EVERY DAY, Disp: 30 tablet, Rfl: 12 .  traZODone (DESYREL) 50 MG tablet, TAKE 2 TO 3 TABLETS BY MOUTH AT BEDTIME, Disp: 60 tablet, Rfl: 12 .  vitamin B-12 (CYANOCOBALAMIN) 1000 MCG tablet, Take 1,000 mcg by mouth daily., Disp: , Rfl:   Review of Systems  Constitutional: Positive for appetite change and weight loss. Negative for chills, diaphoresis, fatigue and fever.  HENT: Positive for congestion, ear pain (right ear), postnasal drip, rhinorrhea (improved), sinus pain, sinus pressure and sneezing. Negative for mouth sores, nosebleeds, sore throat and voice change.   Respiratory: Positive for cough (productive; pale yellow) and wheezing. Negative for hemoptysis, chest tightness and shortness of breath.   Cardiovascular: Negative for chest pain and palpitations.  Gastrointestinal: Negative for abdominal pain, nausea and vomiting.  Musculoskeletal: Negative for myalgias.  Neurological: Negative for dizziness, light-headedness and headaches.    Social History  Substance Use Topics  . Smoking status: Never Smoker  . Smokeless tobacco: Never Used  . Alcohol use No   Objective:   BP  140/90 (BP Location: Left Arm, Patient Position: Sitting, Cuff Size: Large)   Pulse 62   Temp 98.7 F (37.1 C) (Oral)   Resp 16   SpO2 96% Comment: room air  Physical Exam  General Appearance:    Alert, cooperative, no distress  HENT:   ENT exam normal, no neck nodes or sinus tenderness, bilateral TM normal  without fluid or infection, neck without nodes, throat normal without erythema or exudate, sinuses nontender and nasal mucosa pale and congested  Eyes:    PERRL, conjunctiva/corneas clear, EOM's intact       Lungs:     Clear to auscultation bilaterally, respirations unlabored  Heart:    Regular rate and rhythm  Neurologic:   Awake, alert, oriented x 3. No apparent focal neurological           defect.           Assessment & Plan:     1. Upper respiratory tract infection, unspecified type  - amoxicillin (AMOXIL) 500 MG capsule; Take 2 capsules (1,000 mg total) by mouth 2 (two) times daily.  Dispense: 28 capsule; Refill: 0     The entirety of the information documented in the History of Present Illness, Review of Systems and Physical Exam were personally obtained by me. Portions of this information were initially documented by Awilda Billoshena Chambers, CMA and reviewed by me for thoroughness and accuracy.    Mila Merryonald Virdie Penning, MD  [facility]

## 2016-03-07 ENCOUNTER — Encounter: Payer: Self-pay | Admitting: Family Medicine

## 2016-03-07 ENCOUNTER — Ambulatory Visit
Admission: RE | Admit: 2016-03-07 | Discharge: 2016-03-07 | Disposition: A | Discharge status: Home / Self Care | Payer: Managed Care, Other (non HMO) | Source: Ambulatory Visit | Attending: Family Medicine | Admitting: Family Medicine

## 2016-03-07 ENCOUNTER — Ambulatory Visit (INDEPENDENT_AMBULATORY_CARE_PROVIDER_SITE_OTHER): Payer: Managed Care, Other (non HMO) | Admitting: Family Medicine

## 2016-03-07 VITALS — BP 140/84 | HR 58 | Temp 98.5°F | Resp 16 | Wt 342.0 lb

## 2016-03-07 DIAGNOSIS — S63501A Unspecified sprain of right wrist, initial encounter: Secondary | ICD-10-CM | POA: Diagnosis not present

## 2016-03-07 DIAGNOSIS — M25531 Pain in right wrist: Secondary | ICD-10-CM | POA: Insufficient documentation

## 2016-03-07 MED ORDER — MELOXICAM 7.5 MG PO TABS: 7.5000 mg | ORAL_TABLET | Freq: Every day | ORAL | 0 refills | Status: DC

## 2016-03-07 MED ORDER — MELOXICAM 15 MG PO TABS: 15.0000 mg | ORAL_TABLET | Freq: Every day | ORAL | 0 refills | Status: AC

## 2016-03-07 NOTE — Progress Notes (Signed)
Patient: [name] Male    DOB: 1970/12/02   46 y.o.   MRN: [id number] Visit Date: 03/07/2016  Today's Provider: [name], MD   Chief Complaint  Patient presents with  . Hand Pain   Subjective:    Right wrist pain started 1 month ago. Patient stated pain in wrist has gradually worsened over the last month. Symptom of tingling, numbness, and popping in the joint. No swelling. Pain does radiate to pinky and pointer finger. Patient is wearing wrist brace and taking ibuprofen with mild relief.   Hand Pain   The incident occurred more than 1 week ago (1 month ago). There was no injury mechanism. The pain is present in the right wrist. The quality of the pain is described as aching. The pain radiates to the right hand. The pain is at a severity of 4/10. The pain is mild. The pain has been fluctuating since the incident. Associated symptoms include numbness and tingling. Pertinent negatives include no chest pain or muscle weakness. The symptoms are aggravated by movement and lifting. He has tried immobilization and NSAIDs for the symptoms. The treatment provided mild relief.  He thinks he may have sprained it about a month ago. Pain is on ulnar aspect of dorsal wrist. Worse when lifting objects > 5 pounds. Feels a little weak. Occasionally feels some popping in wrist.     Allergies  Allergen Reactions  . Amlodipine Besylate     Constipation, palitations, swelling, shortness of breath  . Augmentin [Amoxicillin-Pot Clavulanate]     intolerant: Causes insomnia  . Bactrim [Sulfamethoxazole-Trimethoprim] Hives    rash  . Cyclobenzaprine   . Indomethacin Swelling    Caused Fluid retention  . Keflex  [Cephalexin]     intolerant  . Medrol [Methylprednisolone] Palpitations     Current Outpatient Prescriptions:  .  benzonatate (TESSALON) 200 MG capsule, Take 200 mg by mouth 3 (three) times daily as needed for cough., Disp: , Rfl:  .  ibuprofen (ADVIL,MOTRIN) 800 MG tablet, TAKE  ONE TABLET TWICE DAILY AS NEEDED, Disp: 60 tablet, Rfl: 3 .  metFORMIN (GLUCOPHAGE-XR) 500 MG 24 hr tablet, TAKE TWO (2) TABLETS BY MOUTH DAILY, Disp: 60 tablet, Rfl: 12 .  metoprolol succinate (TOPROL-XL) 25 MG 24 hr tablet, TAKE ONE-HALF TABLET BY MOUTH DAILY, Disp: 30 tablet, Rfl: 12 .  ONE TOUCH ULTRA TEST test strip, USE UP TO 3 TIMES DAILY AS DIRECTED, Disp: 100 each, Rfl: 5 .  quinapril-hydrochlorothiazide (ACCURETIC) 20-12.5 MG tablet, Take 1 tablet by mouth 2 (two) times daily., Disp: 60 tablet, Rfl: 5 .  sertraline (ZOLOFT) 25 MG tablet, TAKE ONE (1) TABLET BY MOUTH EVERY DAY, Disp: 30 tablet, Rfl: 12 .  traZODone (DESYREL) 50 MG tablet, TAKE 2 TO 3 TABLETS BY MOUTH AT BEDTIME, Disp: 60 tablet, Rfl: 12 .  vitamin B-12 (CYANOCOBALAMIN) 1000 MCG tablet, Take 1,000 mcg by mouth daily., Disp: , Rfl:   Review of Systems  Constitutional: Negative for appetite change, chills and fever.  Respiratory: Negative for chest tightness, shortness of breath and wheezing.   Cardiovascular: Negative for chest pain and palpitations.  Gastrointestinal: Negative for abdominal pain, nausea and vomiting.  Musculoskeletal: Positive for arthralgias.  Neurological: Positive for tingling and numbness.    Social History  Substance Use Topics  . Smoking status: Never Smoker  . Smokeless tobacco: Never Used  . Alcohol use No   Objective:   BP 140/84 (BP Location: Right Arm, Patient Position: Sitting,  Cuff Size: Large)   Pulse (!) 58   Temp 98.5 F (36.9 C) (Oral)   Resp 16   Wt (!) 342 lb (155.1 kg)   SpO2 98%   BMI 46.38 kg/m   Physical Exam  FROM of wrist. No swelling. No erythema. Slight tenderness dorsal-lateral aspect wrist over hamate. Pain with wrist flexion +5 strength.   Dg Wrist Complete Right  Result Date: 03/07/2016 CLINICAL DATA:  Intermittent ulnar aspect right wrist pain with no known injury. Duration of symptoms 1 month. History of diabetes, morbid obesity, gout. EXAM: RIGHT WRIST  - COMPLETE 3+ VIEW COMPARISON:  None in PACs FINDINGS: The bones are subjectively adequately mineralized. There is no lytic or blastic lesion. There is no acute or healing fracture nor dislocation. There are no abnormal soft tissue calcifications. The distal radius and ulna are intact. The ulnar styloid appears normal. IMPRESSION: There is no acute or chronic bony abnormality of the right wrist. Electronically Signed   By: David  SwazilandJordan M.D.   On: 03/07/2016 09:54       Assessment & Plan:     1. Sprain of right wrist, initial encounter Continue relative rest and elastic wrist brace.  - DG Wrist Complete Right; Future - meloxicam (MOBIC) 15 MG tablet; Take 1 tablet (15 mg total) by mouth daily.  Dispense: 15 tablet; Refill: 0       Mila Merryonald Anastacio Bua, MD  [facility]

## 2016-03-30 ENCOUNTER — Encounter: Payer: Self-pay | Admitting: Family Medicine

## 2016-03-30 ENCOUNTER — Ambulatory Visit (INDEPENDENT_AMBULATORY_CARE_PROVIDER_SITE_OTHER): Payer: Managed Care, Other (non HMO) | Admitting: Family Medicine

## 2016-03-30 VITALS — BP 132/88 | HR 54 | Temp 99.6°F | Resp 18 | Ht 72.0 in | Wt 342.0 lb

## 2016-03-30 DIAGNOSIS — Z0000 Encounter for general adult medical examination without abnormal findings: Secondary | ICD-10-CM

## 2016-03-30 DIAGNOSIS — I10 Essential (primary) hypertension: Secondary | ICD-10-CM | POA: Diagnosis not present

## 2016-03-30 NOTE — Patient Instructions (Signed)
   Please contact your eyecare professional to schedule a routine eye exam  

## 2016-03-30 NOTE — Progress Notes (Signed)
Patient: [name], Male    DOB: 25-Sep-1970, 46 y.o.   MRN: [id number] Visit Date: 03/30/2016  Today's Provider: [name], MD   Chief Complaint  Patient presents with  . Annual Exam  . Hypertension    follow up  . Diabetes    follow up   Subjective:    Annual physical exam [name] is a 46 y.o. male who presents today for health maintenance and complete physical. He feels fairly well. He reports exercising 3 times a week. He reports he is sleeping fairly well.  -----------------------------------------------------------------  Hypertension, follow-up:  BP Readings from Last 3 Encounters:  03/07/16 140/84  02/17/16 140/90  02/07/16 126/84    He was last seen for hypertension 2 months ago.  BP at that visit was 148/96. Management since that visit includes no changes. He reports good compliance with treatment. He is not having side effects.  He is exercising. He is not adherent to low salt diet.   Outside blood pressures are 130/85. He is experiencing none.  Patient denies chest pain, chest pressure/discomfort, claudication, dyspnea, exertional chest pressure/discomfort, fatigue, irregular heart beat, lower extremity edema, near-syncope, orthopnea, palpitations, paroxysmal nocturnal dyspnea, syncope and tachypnea.   Cardiovascular risk factors include diabetes mellitus, hypertension, male gender and obesity (BMI >= 30 kg/m2).  Use of agents associated with hypertension: none.     Weight trend: fluctuating a bit Wt Readings from Last 3 Encounters:  03/07/16 (!) 342 lb (155.1 kg)  02/07/16 (!) 345 lb (156.5 kg)  01/12/16 (!) 347 lb (157.4 kg)    Current diet: well balanced  ------------------------------------------------------------------------  Diabetes Mellitus Type II, Follow-up:   Lab Results  Component Value Date   HGBA1C 6.7 01/12/2016   HGBA1C 6.9 09/15/2015   HGBA1C 6.7 04/30/2015    Last seen for diabetes 2 months ago.    Management since then includes no changes. He reports good compliance with treatment. He is not having side effects.  Current symptoms include none and have been stable. Home blood sugar records: 160-170 fasting  Episodes of hypoglycemia? no   Current Insulin Regimen: none Most Recent Eye Exam: > 1 year Weight trend: fluctuating a bit Prior visit with dietician: no Current diet: well balanced Current exercise: cardiovascular workout on exercise equipment  Pertinent Labs:    Component Value Date/Time   CHOL 159 01/12/2016 0907   TRIG 110 01/12/2016 0907   HDL 47 01/12/2016 0907   LDLCALC 90 01/12/2016 0907   CREATININE 0.90 09/16/2015 0825   CREATININE 1.07 12/09/2013 0011    Wt Readings from Last 3 Encounters:  03/07/16 (!) 342 lb (155.1 kg)  02/07/16 (!) 345 lb (156.5 kg)  01/12/16 (!) 347 lb (157.4 kg)    ------------------------------------------------------------------------ Follow up Hyperuricemia:  Patient was last seen for this problem 2 months ago. During that visit labs were ordered and the results were normal. No changes were made.   Review of Systems  Constitutional: Negative for appetite change, chills, fatigue and fever.  HENT: Negative for congestion, ear pain, hearing loss, nosebleeds and trouble swallowing.   Eyes: Negative for pain and visual disturbance.  Respiratory: Negative for cough, chest tightness and shortness of breath.   Cardiovascular: Negative for chest pain, palpitations and leg swelling.  Gastrointestinal: Negative for abdominal pain, blood in stool, constipation, diarrhea, nausea and vomiting.  Endocrine: Negative for polydipsia, polyphagia and polyuria.  Genitourinary: Negative for dysuria and flank pain.  Musculoskeletal: Negative for arthralgias, back pain,  joint swelling, myalgias and neck stiffness.  Skin: Negative for color change, rash and wound.  Neurological: Negative for dizziness, tremors, seizures, speech difficulty,  weakness, light-headedness and headaches.  Psychiatric/Behavioral: Negative for behavioral problems, confusion, decreased concentration, dysphoric mood and sleep disturbance. The patient is not nervous/anxious.   All other systems reviewed and are negative.   Social History      He  reports that he has never smoked. He has never used smokeless tobacco. He reports that he does not drink alcohol or use drugs.       Social History   Social History  . Marital status: Married    Spouse name: N/A  . Number of children: N/A  . Years of education: N/A   Occupational History  .      Employed   Social History Main Topics  . Smoking status: Never Smoker  . Smokeless tobacco: Never Used  . Alcohol use No  . Drug use: No  . Sexual activity: Yes    Birth control/ protection: None   Other Topics Concern  . None   Social History Narrative  . None    Past Medical History:  Diagnosis Date  . Diabetes mellitus without complication (HCC)   . History of hydrocele   . Hypertension      Patient Active Problem List   Diagnosis Date Noted  . Hyperuricemia 01/12/2016  . Gout 09/15/2015  . Bradycardia 09/16/2014  . Left knee pain 09/16/2014  . Palpitations 09/16/2014  . Panic attacks 09/16/2014  . Tachycardia 09/16/2014  . Arthralgia of hip or thigh 03/21/2009  . Anxiety disorder 07/07/2008  . Disorder of skin 03/18/2008  . Proteinuria 10/22/2006  . Essential (primary) hypertension 06/11/2006  . Obesity, morbid (HCC) 06/11/2006  . Diabetes mellitus, type 2 (HCC) 01/28/2000  . Insomnia 05/03/1999    Past Surgical History:  Procedure Laterality Date  . KNEE SURGERY  2006   left knee reconstruction after tibial fracture  . LEG SURGERY  1975   leg and foot surgery; right lower leg circa    Family History        Family Status  Relation Status  . Mother Alive  . Father Deceased at age 47   Cause of Death: Stomach and pancreatic cancer  . Sister Alive  . Brother Alive  .  Other         His family history includes Alcohol abuse in his father; Anxiety disorder in his other; Drug abuse in his sister; Healthy in his brother; Hyperlipidemia in his mother; Lung cancer in his other; Pancreatic cancer in his father; Panic disorder in his mother; Prostate cancer in his other; Stomach cancer in his father.     Allergies  Allergen Reactions  . Amlodipine Besylate     Constipation, palitations, swelling, shortness of breath  . Augmentin [Amoxicillin-Pot Clavulanate]     intolerant: Causes insomnia  . Bactrim [Sulfamethoxazole-Trimethoprim] Hives    rash  . Cyclobenzaprine   . Indomethacin Swelling    Caused Fluid retention  . Keflex  [Cephalexin]     intolerant  . Medrol [Methylprednisolone] Palpitations     Current Outpatient Prescriptions:  .  ibuprofen (ADVIL,MOTRIN) 800 MG tablet, TAKE ONE TABLET TWICE DAILY AS NEEDED, Disp: 60 tablet, Rfl: 3 .  metFORMIN (GLUCOPHAGE-XR) 500 MG 24 hr tablet, TAKE TWO (2) TABLETS BY MOUTH DAILY, Disp: 60 tablet, Rfl: 12 .  metoprolol succinate (TOPROL-XL) 25 MG 24 hr tablet, TAKE ONE-HALF TABLET BY MOUTH DAILY, Disp: 30 tablet,  Rfl: 12 .  ONE TOUCH ULTRA TEST test strip, USE UP TO 3 TIMES DAILY AS DIRECTED, Disp: 100 each, Rfl: 5 .  quinapril-hydrochlorothiazide (ACCURETIC) 20-12.5 MG tablet, Take 1 tablet by mouth 2 (two) times daily., Disp: 60 tablet, Rfl: 5 .  sertraline (ZOLOFT) 25 MG tablet, TAKE ONE (1) TABLET BY MOUTH EVERY DAY, Disp: 30 tablet, Rfl: 12 .  traZODone (DESYREL) 50 MG tablet, TAKE 2 TO 3 TABLETS BY MOUTH AT BEDTIME, Disp: 60 tablet, Rfl: 12 .  vitamin B-12 (CYANOCOBALAMIN) 1000 MCG tablet, Take 1,000 mcg by mouth daily., Disp: , Rfl:    Patient Care Team: Malva Limes, MD as PCP - General (Family Medicine)      Objective:   Vitals: BP 132/88 (BP Location: Left Arm, Patient Position: Sitting, Cuff Size: Large)   Pulse (!) 54   Temp 99.6 F (37.6 C) (Oral)   Resp 18   Ht 6' (1.829 m)   Wt (!)  342 lb (155.1 kg) Comment: per patient report; patient refused to weigh on our scale  SpO2 96% Comment: room air  BMI 46.38 kg/m    Physical Exam   General Appearance:    Alert, cooperative, no distress, appears stated age, morbidly obese  Head:    Normocephalic, without obvious abnormality, atraumatic  Eyes:    PERRL, conjunctiva/corneas clear, EOM's intact, fundi    benign, both eyes       Ears:    Normal TM's and external ear canals, both ears  Nose:   Nares normal, septum midline, mucosa normal, no drainage   or sinus tenderness  Throat:   Lips, mucosa, and tongue normal; teeth and gums normal  Neck:   Supple, symmetrical, trachea midline, no adenopathy;       thyroid:  No enlargement/tenderness/nodules; no carotid   bruit or JVD  Back:     Symmetric, no curvature, ROM normal, no CVA tenderness  Lungs:     Clear to auscultation bilaterally, respirations unlabored  Chest wall:    No tenderness or deformity  Heart:    Regular rate and rhythm, S1 and S2 normal, no murmur, rub   or gallop  Abdomen:     Soft, non-tender, bowel sounds active all four quadrants,    no masses, no organomegaly  Genitalia:    deferred  Rectal:    deferred  Extremities:   Extremities normal, atraumatic, no cyanosis or edema  Pulses:   2+ and symmetric all extremities  Skin:   Skin color, texture, turgor normal, no rashes or lesions  Lymph nodes:   Cervical, supraclavicular, and axillary nodes normal  Neurologic:   CNII-XII intact. Normal strength, sensation and reflexes      throughout    Depression Screen PHQ 2/9 Scores 03/30/2016 12/01/2014  PHQ - 2 Score 0 1  PHQ- 9 Score 2 5   Current Exercise Habits: Structured exercise class, Type of exercise: treadmill, Time (Minutes): 35, Frequency (Times/Week): 3, Weekly Exercise (Minutes/Week): 105, Intensity: Moderate Exercise limited by: None identified    Assessment & Plan:     Routine Health Maintenance and Physical Exam  Exercise Activities and  Dietary recommendations Goals    None      Immunization History  Administered Date(s) Administered  . Influenza-Unspecified 11/28/2014  . Pneumococcal Polysaccharide-23 01/08/2013  . Td 10/29/1998  . Tdap 10/02/2007    Health Maintenance  Topic Date Due  . FOOT EXAM  12/06/1980  . OPHTHALMOLOGY EXAM  12/06/1980  . HIV Screening  12/06/1985  .  INFLUENZA VACCINE  09/28/2015  . HEMOGLOBIN A1C  07/11/2016  . TETANUS/TDAP  10/01/2017  . PNEUMOCOCCAL POLYSACCHARIDE VACCINE (2) 01/08/2018     Discussed health benefits of physical activity, and encouraged him to engage in regular exercise appropriate for his age and condition.    --------------------------------------------------------------------  1. Annual physical exam   2. Essential (primary) hypertension Well controlled.  Continue current medications.   - EKG 12-Lead   The entirety of the information documented in the History of Present Illness, Review of Systems and Physical Exam were personally obtained by me. Portions of this information were initially documented by Awilda Billoshena Chambers, CMA and reviewed by me for thoroughness and accuracy.    Mila Merryonald Rocklin Soderquist, MD  [facility]

## 2016-04-01 ENCOUNTER — Other Ambulatory Visit: Payer: Self-pay | Admitting: Family Medicine

## 2016-04-28 LAB — HM DIABETES EYE EXAM

## 2016-05-11 ENCOUNTER — Ambulatory Visit (INDEPENDENT_AMBULATORY_CARE_PROVIDER_SITE_OTHER): Payer: Managed Care, Other (non HMO) | Admitting: Family Medicine

## 2016-05-11 ENCOUNTER — Encounter: Payer: Self-pay | Admitting: Family Medicine

## 2016-05-11 VITALS — BP 140/96 | HR 53 | Temp 99.5°F | Resp 16 | Wt 340.0 lb

## 2016-05-11 DIAGNOSIS — E119 Type 2 diabetes mellitus without complications: Secondary | ICD-10-CM | POA: Diagnosis not present

## 2016-05-11 DIAGNOSIS — J301 Allergic rhinitis due to pollen: Secondary | ICD-10-CM

## 2016-05-11 DIAGNOSIS — I10 Essential (primary) hypertension: Secondary | ICD-10-CM

## 2016-05-11 LAB — POCT GLYCOSYLATED HEMOGLOBIN (HGB A1C)
ESTIMATED AVERAGE GLUCOSE: 160
Hemoglobin A1C: 7.2

## 2016-05-11 NOTE — Patient Instructions (Signed)
l °

## 2016-05-11 NOTE — Progress Notes (Signed)
Patient: Micheal FriezeBrian S Speckman Male    DOB: 11/12/1970   46 y.o.   MRN: [id number] Visit Date: 05/11/2016  Today's Provider: Mila Merryonald Fisher, MD   Chief Complaint  Patient presents with  . Diabetes    follow up  . Hypertension    follow up   Subjective:    HPI  Diabetes Mellitus Type II, Follow-up:   Lab Results  Component Value Date   HGBA1C 6.7 01/12/2016   HGBA1C 6.9 09/15/2015   HGBA1C 6.7 04/30/2015    Last seen for diabetes 4 months ago.  Management since then includes no changes. He reports good compliance with treatment. He is not having side effects.  Current symptoms include visual disturbances and have been stable. Home blood sugar records: fasting range: 170-180  Episodes of hypoglycemia? no   Current Insulin Regimen: none Most Recent Eye Exam: 03/2016 Weight trend: fluctuating a bit Prior visit with dietician: no Current diet: well balanced Current exercise: aerobics  Pertinent Labs:    Component Value Date/Time   CHOL 159 01/12/2016 0907   TRIG 110 01/12/2016 0907   HDL 47 01/12/2016 0907   LDLCALC 90 01/12/2016 0907   CREATININE 0.90 09/16/2015 0825   CREATININE 1.07 12/09/2013 0011    Wt Readings from Last 3 Encounters:  05/11/16 (!) 340 lb (154.2 kg)  03/30/16 (!) 342 lb (155.1 kg)  03/07/16 (!) 342 lb (155.1 kg)    ------------------------------------------------------------------------  Hypertension, follow-up:  BP Readings from Last 3 Encounters:  03/30/16 132/88  03/07/16 140/84  02/17/16 140/90    He was last seen for hypertension 1 months ago.  BP at that visit was 132/88. Management since that visit includes no changes. He reports good compliance with treatment. He is not having side effects.  He is exercising. He is not adherent to low salt diet.   Outside blood pressures are not being checked. Patient denies chest pain, dyspnea, irregular heart beat, orthopnea, palpitations and syncope.   Cardiovascular risk factors  include diabetes mellitus, hypertension and male gender.  Use of agents associated with hypertension: none.     Weight trend: fluctuating a bit Wt Readings from Last 3 Encounters:  05/11/16 (!) 340 lb (154.2 kg)  03/30/16 (!) 342 lb (155.1 kg)  03/07/16 (!) 342 lb (155.1 kg)    Current diet: well balanced  ------------------------------------------------------------------------ Follow up Hyperuricemia:  Patient was last seen for this problem 1 month ago and no changes were made.     Allergies  Allergen Reactions  . Amlodipine Besylate     Constipation, palitations, swelling, shortness of breath  . Augmentin [Amoxicillin-Pot Clavulanate]     intolerant: Causes insomnia  . Bactrim [Sulfamethoxazole-Trimethoprim] Hives    rash  . Cyclobenzaprine   . Indomethacin Swelling    Caused Fluid retention  . Keflex  [Cephalexin]     intolerant  . Medrol [Methylprednisolone] Palpitations     Current Outpatient Prescriptions:  .  ibuprofen (ADVIL,MOTRIN) 800 MG tablet, TAKE ONE TABLET TWICE DAILY AS NEEDED, Disp: 60 tablet, Rfl: 3 .  metFORMIN (GLUCOPHAGE-XR) 500 MG 24 hr tablet, TAKE TWO (2) TABLETS BY MOUTH DAILY, Disp: 60 tablet, Rfl: 12 .  metoprolol succinate (TOPROL-XL) 25 MG 24 hr tablet, TAKE ONE-HALF TABLET BY MOUTH DAILY, Disp: 30 tablet, Rfl: 12 .  ONE TOUCH ULTRA TEST test strip, USE UP TO 3 TIMES DAILY AS DIRECTED, Disp: 100 each, Rfl: 5 .  quinapril-hydrochlorothiazide (ACCURETIC) 20-12.5 MG tablet, TAKE ONE TABLET BY MOUTH TWICE  DAILY, Disp: 60 tablet, Rfl: 12 .  sertraline (ZOLOFT) 25 MG tablet, TAKE ONE (1) TABLET BY MOUTH EVERY DAY, Disp: 30 tablet, Rfl: 12 .  traZODone (DESYREL) 50 MG tablet, TAKE 2 TO 3 TABLETS BY MOUTH AT BEDTIME, Disp: 60 tablet, Rfl: 12 .  vitamin B-12 (CYANOCOBALAMIN) 1000 MCG tablet, Take 1,000 mcg by mouth daily., Disp: , Rfl:   Review of Systems  Constitutional: Negative for appetite change, chills and fever.  HENT: Positive for congestion,  postnasal drip, rhinorrhea and sneezing. Negative for ear pain, facial swelling, hearing loss, sinus pain, sinus pressure and sore throat.   Eyes: Positive for visual disturbance.  Respiratory: Negative for chest tightness, shortness of breath and wheezing.   Cardiovascular: Negative for chest pain and palpitations.  Gastrointestinal: Negative for abdominal pain, nausea and vomiting.  Endocrine: Negative for cold intolerance, heat intolerance, polydipsia, polyphagia and polyuria.    Social History  Substance Use Topics  . Smoking status: Never Smoker  . Smokeless tobacco: Never Used  . Alcohol use No   Objective:   BP (!) 140/96 (BP Location: Left Arm, Patient Position: Sitting, Cuff Size: Large)   Pulse (!) 53   Temp 99.5 F (37.5 C) (Oral)   Resp 16   Wt (!) 340 lb (154.2 kg)   SpO2 97% Comment: room air  BMI 46.11 kg/m  Vitals:   05/11/16 0812  Weight: (!) 340 lb (154.2 kg)     Physical Exam  General appearance: alert, well developed, well nourished, cooperative and in no distress, morbidly obese Head: Normocephalic, without obvious abnormality, atraumatic Respiratory: Respirations even and unlabored, normal respiratory rate  Results for orders placed or performed in visit on 05/11/16  POCT HgB A1C  Result Value Ref Range   Hemoglobin A1C 7.2    Est. average glucose Bld gHb Est-mCnc 160         Assessment & Plan:     1. Type 2 diabetes mellitus without complication, without long-term current use of insulin (HCC) A1c up a bit - POCT HgB A1C  2. Essential (primary) hypertension BP up a bit. He has not been feeling well due to allergies. He is going to work on exercising more and getting more strict with diet. Consider starting back on pioglitazone if not improved at follow up.   3. Obesity, morbid (HCC)   4. Allergic rhinitis due to pollen, unspecified chronicity, unspecified seasonality Advised he may take OTC loratadine or fexofenadine, or fluticasone  nasal spray, but to avoid decongestants.   Return in about 4 months (around 09/10/2016).       [name], MD  [facility]

## 2016-05-24 ENCOUNTER — Encounter: Payer: Self-pay | Admitting: Family Medicine

## 2016-05-24 ENCOUNTER — Ambulatory Visit (INDEPENDENT_AMBULATORY_CARE_PROVIDER_SITE_OTHER): Payer: Managed Care, Other (non HMO) | Admitting: Family Medicine

## 2016-05-24 ENCOUNTER — Ambulatory Visit: Payer: Self-pay | Admitting: Physician Assistant

## 2016-05-24 ENCOUNTER — Encounter: Payer: Self-pay | Admitting: Physician Assistant

## 2016-05-24 VITALS — BP 158/92 | HR 56 | Temp 98.5°F | Wt 340.0 lb

## 2016-05-24 VITALS — BP 160/89 | HR 98 | Temp 98.2°F

## 2016-05-24 DIAGNOSIS — I10 Essential (primary) hypertension: Secondary | ICD-10-CM

## 2016-05-24 DIAGNOSIS — B379 Candidiasis, unspecified: Secondary | ICD-10-CM

## 2016-05-24 MED ORDER — CLOTRIMAZOLE-BETAMETHASONE 1-0.05 % EX CREA: 1.0000 "application " | TOPICAL_CREAM | Freq: Two times a day (BID) | CUTANEOUS | 0 refills | Status: DC

## 2016-05-24 MED ORDER — SERTRALINE HCL 25 MG PO TABS: ORAL_TABLET | ORAL | 12 refills | Status: DC

## 2016-05-24 MED ORDER — VALSARTAN-HYDROCHLOROTHIAZIDE 320-12.5 MG PO TABS: 1.0000 | ORAL_TABLET | Freq: Every day | ORAL | 0 refills | Status: DC

## 2016-05-24 NOTE — Progress Notes (Signed)
Patient: Micheal FriezeBrian S Wheeler Male    DOB: [birth year]   46 y.o.   MRN: [id number] Visit Date: 05/24/2016  Today's Provider: Mila Merryonald Fisher, MD   Chief Complaint  Patient presents with  . Hypertension  . Follow-up   Subjective:    HPI  Hypertension, follow-up:  BP Readings from Last 3 Encounters:  05/24/16 (!) 158/92  05/24/16 (!) 160/89  05/11/16 (!) 140/96    He was last seen for hypertension 2 weeks ago.  BP at that visit was 160/89. Management changes since that visit include recommended to increase exercise and get on a strict diet. He reports good compliance with treatment. He is not having side effects.  He is exercising. He is not adherent to low salt diet.   [facility] and BP was initially 190/110, but come down to 160/89. He is experiencing none.  Patient denies chest pain, chest pressure/discomfort, claudication, dyspnea, exertional chest pressure/discomfort, fatigue, irregular heart beat, lower extremity edema, near-syncope, orthopnea, palpitations, paroxysmal nocturnal dyspnea, syncope and tachypnea.   Cardiovascular risk factors include diabetes mellitus, hypertension, male gender and obesity (BMI >= 30 kg/m2).  Use of agents associated with hypertension: none.   Has been under much more stress at work. Takes occasional ibuprofen, usually     Weight trend: stable Wt Readings from Last 3 Encounters:  05/24/16 (!) 340 lb (154.2 kg)  05/11/16 (!) 340 lb (154.2 kg)  03/30/16 (!) 342 lb (155.1 kg)    Current diet: diabetic  ------------------------------------------------------------------------    Previous Medications   CLOTRIMAZOLE-BETAMETHASONE (LOTRISONE) CREAM    Apply 1 application topically 2 (two) times daily.   IBUPROFEN (ADVIL,MOTRIN) 800 MG TABLET    TAKE ONE TABLET TWICE DAILY AS NEEDED   METFORMIN (GLUCOPHAGE-XR) 500 MG 24 HR TABLET    TAKE TWO (2) TABLETS BY MOUTH DAILY   METOPROLOL SUCCINATE (TOPROL-XL) 25 MG 24 HR  TABLET    TAKE ONE-HALF TABLET BY MOUTH DAILY   ONE TOUCH ULTRA TEST TEST STRIP    USE UP TO 3 TIMES DAILY AS DIRECTED   QUINAPRIL-HYDROCHLOROTHIAZIDE (ACCURETIC) 20-12.5 MG TABLET    TAKE ONE TABLET BY MOUTH TWICE DAILY   SERTRALINE (ZOLOFT) 25 MG TABLET    TAKE ONE (1) TABLET BY MOUTH EVERY DAY   TRAZODONE (DESYREL) 50 MG TABLET    TAKE 2 TO 3 TABLETS BY MOUTH AT BEDTIME   VITAMIN B-12 (CYANOCOBALAMIN) 1000 MCG TABLET    Take 1,000 mcg by mouth daily.    Review of Systems  Constitutional: Negative.   Respiratory: Negative.   Cardiovascular: Negative.     Social History  Substance Use Topics  . Smoking status: Never Smoker  . Smokeless tobacco: Never Used  . Alcohol use No   Objective:   BP (!) 158/92 (BP Location: Right Arm, Patient Position: Sitting, Cuff Size: Large)   Pulse (!) 56   Temp 98.5 F (36.9 C) (Oral)   Wt (!) 340 lb (154.2 kg)   BMI 46.11 kg/m   Physical Exam  General appearance: alert, well developed, well nourished, cooperative and in no distress Head: Normocephalic, without obvious abnormality, atraumatic Respiratory: Respirations even and unlabored, normal respiratory rate Extremities: No gross deformities Skin: Skin color, texture, turgor normal. No rashes seen  Psych: Appropriate mood and affect. Neurologic: Mental status: Alert, oriented to person, place, and time, thought content appropriate.     Assessment & Plan:     1. Essential (primary) hypertension Uncontrolled. Did not tolerate higher  doses of metoprolol due to bradycardia. Did not tolerate higher doses of hctz due to gout. Will change from quinipril-hctz to  - valsartan-hydrochlorothiazide (DIOVAN-HCT) 320-12.5 MG tablet; Take 1 tablet by mouth daily.  Dispense: 30 tablet; Refill: 0  Return in 3 weeks for BP and electrolyte check.   Follow up: No Follow-up on file.

## 2016-05-24 NOTE — Progress Notes (Signed)
160/89 

## 2016-05-24 NOTE — Progress Notes (Signed)
S: c/o rash on left side of abdomen, some on left upper thigh, hx of diabetes, no fever/chills, no drainage from side, used hydrocortisone which helped a little, doesn't think the otc fungal medication helped, sx for 2 days  O: vitals w elevated bp b/l, lungs c t a, cv rrr, abd with 6inch by 3 inch area of pink warm rash, no vesicles, no drainage, same rash on left upper thigh, n/v intact  A: candidiasis  P: trial of lotrisone cream, if not better in 2 d or if worsening will send rx for an antibiotic, also pt is to call his pcp for eval of the elevated bp, check bp at home

## 2016-06-14 ENCOUNTER — Ambulatory Visit (INDEPENDENT_AMBULATORY_CARE_PROVIDER_SITE_OTHER): Payer: Managed Care, Other (non HMO) | Admitting: Family Medicine

## 2016-06-14 ENCOUNTER — Encounter: Payer: Self-pay | Admitting: Family Medicine

## 2016-06-14 VITALS — BP 130/80 | HR 55 | Temp 98.8°F | Resp 16 | Wt 343.0 lb

## 2016-06-14 DIAGNOSIS — I10 Essential (primary) hypertension: Secondary | ICD-10-CM | POA: Diagnosis not present

## 2016-06-14 NOTE — Progress Notes (Signed)
Patient: [name] Male    DOB: [birth year]   46 y.o.   MRN: [id number] Visit Date: 06/14/2016  Today's Provider: [name], MD   Chief Complaint  Patient presents with  . Follow-up  . Hypertension   Subjective:    HPI     Hypertension, follow-up:  BP Readings from Last 3 Encounters:  06/14/16 130/80  05/24/16 (!) 158/92  05/24/16 (!) 160/89    He was last seen for hypertension 3 weeks ago.  BP at that visit was 158/92. Management since that visit includes; changed from quinipril-HCTZ to valsartan-HCTZ 320-125 mg qd.He reports good compliance with treatment. He is not having side effects. none He is not exercising. He is adherent to low salt diet.   Outside blood pressures are 150/60. He is experiencing none.  Patient denies none.   Cardiovascular risk factors include none.  Use of agents associated with hypertension: none.   ----------------------------------------------------------------    Allergies  Allergen Reactions  . Amlodipine Besylate     Constipation, palitations, swelling, shortness of breath  . Augmentin [Amoxicillin-Pot Clavulanate]     intolerant: Causes insomnia  . Bactrim [Sulfamethoxazole-Trimethoprim] Hives    rash  . Cyclobenzaprine   . Indomethacin Swelling    Caused Fluid retention  . Keflex  [Cephalexin]     intolerant  . Medrol [Methylprednisolone] Palpitations     Current Outpatient Prescriptions:  .  clotrimazole-betamethasone (LOTRISONE) cream, Apply 1 application topically 2 (two) times daily., Disp: 30 g, Rfl: 0 .  ibuprofen (ADVIL,MOTRIN) 800 MG tablet, TAKE ONE TABLET TWICE DAILY AS NEEDED, Disp: 60 tablet, Rfl: 3 .  metFORMIN (GLUCOPHAGE-XR) 500 MG 24 hr tablet, TAKE TWO (2) TABLETS BY MOUTH DAILY, Disp: 60 tablet, Rfl: 12 .  metoprolol succinate (TOPROL-XL) 25 MG 24 hr tablet, TAKE ONE-HALF TABLET BY MOUTH DAILY, Disp: 30 tablet, Rfl: 12 .  ONE TOUCH ULTRA TEST test strip, USE UP TO 3 TIMES DAILY AS  DIRECTED, Disp: 100 each, Rfl: 5 .  sertraline (ZOLOFT) 25 MG tablet, TAKE ONE (1) TABLET BY MOUTH EVERY DAY, Disp: 30 tablet, Rfl: 12 .  traZODone (DESYREL) 50 MG tablet, TAKE 2 TO 3 TABLETS BY MOUTH AT BEDTIME, Disp: 60 tablet, Rfl: 12 .  valsartan-hydrochlorothiazide (DIOVAN-HCT) 320-12.5 MG tablet, Take 1 tablet by mouth daily., Disp: 30 tablet, Rfl: 0 .  vitamin B-12 (CYANOCOBALAMIN) 1000 MCG tablet, Take 1,000 mcg by mouth daily., Disp: , Rfl:   Review of Systems  Constitutional: Negative for appetite change, chills and fever.  Respiratory: Negative for chest tightness, shortness of breath and wheezing.   Cardiovascular: Negative for chest pain and palpitations.  Gastrointestinal: Negative for abdominal pain, nausea and vomiting.    Social History  Substance Use Topics  . Smoking status: Never Smoker  . Smokeless tobacco: Never Used  . Alcohol use No   Objective:   BP 130/80 (BP Location: Right Arm, Patient Position: Sitting, Cuff Size: Large)   Pulse (!) 55   Temp 98.8 F (37.1 C) (Oral)   Resp 16   Wt (!) 343 lb (155.6 kg)   SpO2 98%   BMI 46.52 kg/m  Vitals:   06/14/16 1611  BP: 130/80  Pulse: (!) 55  Resp: 16  Temp: 98.8 F (37.1 C)  TempSrc: Oral  SpO2: 98%  Weight: (!) 343 lb (155.6 kg)     Physical Exam  General appearance: alert, well developed, well nourished, cooperative and in no distress Head: Normocephalic, without obvious abnormality,  atraumatic Respiratory: Respirations even and unlabored, normal respiratory rate Extremities: No gross deformities Skin: Skin color, texture, turgor normal. No rashes seen  Psych: Appropriate mood and affect. Neurologic: Mental status: Alert, oriented to person, place, and time, thought content appropriate.     Assessment & Plan:     1. Essential (primary) hypertension Improved since change from quinipril-hctz  to valsartan-hctz. Continue current medications.  If labs normal will send refills to get by until  diabetes follow up in July.  - Renal function panel       [name], MD  [facility]

## 2016-06-16 ENCOUNTER — Other Ambulatory Visit: Payer: Self-pay

## 2016-06-16 DIAGNOSIS — Z299 Encounter for prophylactic measures, unspecified: Secondary | ICD-10-CM

## 2016-06-16 NOTE — Progress Notes (Signed)
Patient came in to have blood drawn for testing per Dr. Dorinda Hill Fisher's orders.

## 2016-06-17 ENCOUNTER — Other Ambulatory Visit: Payer: Self-pay | Admitting: Family Medicine

## 2016-06-17 DIAGNOSIS — I10 Essential (primary) hypertension: Secondary | ICD-10-CM

## 2016-06-17 LAB — RENAL FUNCTION PANEL
ALBUMIN: 4.1 g/dL (ref 3.5–5.5)
BUN/Creatinine Ratio: 14 (ref 9–20)
BUN: 11 mg/dL (ref 6–24)
CHLORIDE: 101 mmol/L (ref 96–106)
CO2: 23 mmol/L (ref 18–29)
Calcium: 8.9 mg/dL (ref 8.7–10.2)
Creatinine, Ser: 0.8 mg/dL (ref 0.76–1.27)
GFR, EST AFRICAN AMERICAN: 125 mL/min/{1.73_m2} (ref >59)
GFR, EST NON AFRICAN AMERICAN: 108 mL/min/{1.73_m2} (ref >59)
Glucose: 165 mg/dL — ABNORMAL HIGH (ref 65–99)
Phosphorus: 3.9 mg/dL (ref 2.5–4.5)
Potassium: 4.1 mmol/L (ref 3.5–5.2)
Sodium: 141 mmol/L (ref 134–144)

## 2016-06-23 ENCOUNTER — Ambulatory Visit (INDEPENDENT_AMBULATORY_CARE_PROVIDER_SITE_OTHER): Payer: Managed Care, Other (non HMO) | Admitting: Family Medicine

## 2016-06-23 ENCOUNTER — Encounter: Payer: Self-pay | Admitting: Family Medicine

## 2016-06-23 VITALS — BP 146/90 | HR 58 | Temp 98.3°F

## 2016-06-23 DIAGNOSIS — L03311 Cellulitis of abdominal wall: Secondary | ICD-10-CM

## 2016-06-23 MED ORDER — SULFAMETHOXAZOLE-TRIMETHOPRIM 800-160 MG PO TABS: 2.0000 | ORAL_TABLET | Freq: Two times a day (BID) | ORAL | 0 refills | Status: DC

## 2016-06-23 NOTE — Progress Notes (Signed)
Patient: [name] Male    DOB: 1970-05-25   46 y.o.   MRN: [id number] Visit Date: 06/23/2016  Today's Provider: [name], MD   Chief Complaint  Patient presents with  . Mass   Subjective:    HPI Mass:  Patient comes in today complaining of a boil in the lower right quadrant near his waist line. He states it first appeared 4 days ago and has worsened. Patient reports the mass in red, painful and swollen. Patient has been taking Ibuprofen to help relieve pain and swelling. The mass has drained a small amount of blood after patient lanced it with a clean needle.he had similar lesion in October, 2017 which grew Proteus mrablis and cleared with doxycyline despite sensitives showing tetracycline resistance. He states he never actually took Septra that was prescribed after sensitivities came back, and is certain that he has never actually had an adverse reaction to a sulfa antibiotic.     Allergies  Allergen Reactions  . Amlodipine Besylate     Constipation, palitations, swelling, shortness of breath  . Augmentin [Amoxicillin-Pot Clavulanate]     intolerant: Causes insomnia  . Bactrim [Sulfamethoxazole-Trimethoprim] Hives    rash  . Cyclobenzaprine   . Indomethacin Swelling    Caused Fluid retention  . Keflex  [Cephalexin]     intolerant  . Medrol [Methylprednisolone] Palpitations     Current Outpatient Prescriptions:  .  clotrimazole-betamethasone (LOTRISONE) cream, Apply 1 application topically 2 (two) times daily., Disp: 30 g, Rfl: 0 .  ibuprofen (ADVIL,MOTRIN) 800 MG tablet, TAKE ONE TABLET TWICE DAILY AS NEEDED, Disp: 60 tablet, Rfl: 3 .  metFORMIN (GLUCOPHAGE-XR) 500 MG 24 hr tablet, TAKE TWO (2) TABLETS BY MOUTH DAILY, Disp: 60 tablet, Rfl: 12 .  metoprolol succinate (TOPROL-XL) 25 MG 24 hr tablet, TAKE ONE-HALF TABLET BY MOUTH DAILY, Disp: 30 tablet, Rfl: 12 .  ONE TOUCH ULTRA TEST test strip, USE UP TO 3 TIMES DAILY AS DIRECTED, Disp: 100 each, Rfl: 5 .   sertraline (ZOLOFT) 25 MG tablet, TAKE ONE (1) TABLET BY MOUTH EVERY DAY, Disp: 30 tablet, Rfl: 12 .  traZODone (DESYREL) 50 MG tablet, TAKE 2 TO 3 TABLETS BY MOUTH AT BEDTIME, Disp: 60 tablet, Rfl: 12 .  valsartan-hydrochlorothiazide (DIOVAN-HCT) 320-12.5 MG tablet, TAKE ONE (1) TABLET BY MOUTH EVERY DAY, Disp: 30 tablet, Rfl: 3 .  vitamin B-12 (CYANOCOBALAMIN) 1000 MCG tablet, Take 1,000 mcg by mouth daily., Disp: , Rfl:   Review of Systems  Constitutional: Negative for appetite change, chills and fever.  Respiratory: Negative for chest tightness, shortness of breath and wheezing.   Cardiovascular: Negative for chest pain and palpitations.  Gastrointestinal: Negative for abdominal pain, nausea and vomiting.  Skin: Positive for color change.       Mass on right lower quadrant    Social History  Substance Use Topics  . Smoking status: Never Smoker  . Smokeless tobacco: Never Used  . Alcohol use No   Objective:   BP (!) 146/90 (BP Location: Left Arm, Patient Position: Sitting, Cuff Size: Large)   Pulse (!) 58   Temp 98.3 F (36.8 C) (Oral)   SpO2 96% Comment: room air There were no vitals filed for this visit.   Physical Exam  Integ: firm tender subcutaneous nodule right inguinal area with red inflamed overlying integument. Scant amount yellow discharge.     Assessment & Plan:     1. Cellulitis of abdominal wall He states that it was  actually the doxycycline, and not the sulfa antibiotic that caused itching and rash last October, and prefers not to take doxycyline again.  - sulfamethoxazole-trimethoprim (BACTRIM DS,SEPTRA DS) 800-160 MG tablet; Take 2 tablets by mouth 2 (two) times daily.  Dispense: 28 tablet; Refill: 0 - Wound culture     The entirety of the information documented in the History of Present Illness, Review of Systems and Physical Exam were personally obtained by me. Portions of this information were initially documented by Awilda Bill, CMA and reviewed by  me for thoroughness and accuracy.   [name], MD  [facility]

## 2016-06-26 LAB — WOUND CULTURE

## 2016-07-06 ENCOUNTER — Other Ambulatory Visit: Payer: Self-pay | Admitting: Family Medicine

## 2016-08-19 ENCOUNTER — Other Ambulatory Visit: Payer: Self-pay | Admitting: Family Medicine

## 2016-08-19 DIAGNOSIS — E119 Type 2 diabetes mellitus without complications: Secondary | ICD-10-CM

## 2016-09-11 ENCOUNTER — Ambulatory Visit (INDEPENDENT_AMBULATORY_CARE_PROVIDER_SITE_OTHER): Payer: Managed Care, Other (non HMO) | Admitting: Family Medicine

## 2016-09-11 ENCOUNTER — Encounter: Payer: Self-pay | Admitting: Family Medicine

## 2016-09-11 VITALS — BP 128/80 | HR 51 | Temp 98.5°F | Resp 18 | Wt 318.0 lb

## 2016-09-11 DIAGNOSIS — E119 Type 2 diabetes mellitus without complications: Secondary | ICD-10-CM | POA: Diagnosis not present

## 2016-09-11 DIAGNOSIS — I10 Essential (primary) hypertension: Secondary | ICD-10-CM

## 2016-09-11 LAB — POCT GLYCOSYLATED HEMOGLOBIN (HGB A1C)
Est. average glucose Bld gHb Est-mCnc: 111
Hemoglobin A1C: 5.5

## 2016-09-11 MED ORDER — IBUPROFEN 800 MG PO TABS: ORAL_TABLET | ORAL | 1 refills | Status: DC

## 2016-09-11 NOTE — Progress Notes (Signed)
Patient: [name] Male    DOB: April 30, 1970   46 y.o.   MRN: [id number] Visit Date: 09/11/2016  Today's Provider: [name], MD   Chief Complaint  Patient presents with  . Diabetes    follow up  . Hypertension    follow up   Subjective:    HPI  Diabetes Mellitus Type II, Follow-up:   Lab Results  Component Value Date   HGBA1C 7.2 05/11/2016   HGBA1C 6.7 01/12/2016   HGBA1C 6.9 09/15/2015    Last seen for diabetes 4 months ago.  Management since then includes no changes. He reports good compliance with treatment. He is not having side effects.  Current symptoms include none and have been stable. Home blood sugar records: Has not been checking sugars in the past few weeks  Episodes of hypoglycemia? no   Current Insulin Regimen: none Most Recent Eye Exam: < 1 year ago Weight trend: decreasing steadily Prior visit with dietician: no Current diet: in general, a "healthy" diet   Current exercise: cardiovascular workout on exercise equipment  Pertinent Labs:    Component Value Date/Time   CHOL 159 01/12/2016 0907   TRIG 110 01/12/2016 0907   HDL 47 01/12/2016 0907   LDLCALC 90 01/12/2016 0907   CREATININE 0.80 06/16/2016 0844   CREATININE 1.07 12/09/2013 0011    Wt Readings from Last 3 Encounters:  06/14/16 (!) 343 lb (155.6 kg)  05/24/16 (!) 340 lb (154.2 kg)  05/11/16 (!) 340 lb (154.2 kg)    ------------------------------------------------------------------------  Hypertension, follow-up:  BP Readings from Last 3 Encounters:  06/23/16 (!) 146/90  06/14/16 130/80  05/24/16 (!) 158/92    He was last seen for hypertension 3 months ago.  BP at that visit was 130/80. Management since that visit includes no changes. He reports good compliance with treatment. He is not having side effects.  He is exercising. He is adherent to low salt diet.   Outside blood pressures are not checked . He is experiencing none.  Patient denies chest pain,  chest pressure/discomfort, claudication, dyspnea, exertional chest pressure/discomfort, fatigue, irregular heart beat, lower extremity edema, near-syncope, orthopnea, palpitations, paroxysmal nocturnal dyspnea, syncope and tachypnea.   Cardiovascular risk factors include diabetes mellitus, hypertension, male gender and obesity (BMI >= 30 kg/m2).  Use of agents associated with hypertension: none.     Weight trend: decreasing steadily Wt Readings from Last 3 Encounters:  06/14/16 (!) 343 lb (155.6 kg)  05/24/16 (!) 340 lb (154.2 kg)  05/11/16 (!) 340 lb (154.2 kg)    Current diet: in general, a "healthy" diet    ------------------------------------------------------------------------     Allergies  Allergen Reactions  . Amlodipine Besylate     Constipation, palitations, swelling, shortness of breath  . Augmentin [Amoxicillin-Pot Clavulanate]     intolerant: Causes insomnia  . Bactrim [Sulfamethoxazole-Trimethoprim] Hives    rash  . Cyclobenzaprine   . Doxycycline     Upset stomach  . Indomethacin Swelling    Caused Fluid retention  . Keflex  [Cephalexin]     intolerant  . Medrol [Methylprednisolone] Palpitations     Current Outpatient Prescriptions:  .  ibuprofen (ADVIL,MOTRIN) 800 MG tablet, TAKE ONE TABLET TWICE DAILY AS NEEDED, Disp: 60 tablet, Rfl: 3 .  metFORMIN (GLUCOPHAGE-XR) 500 MG 24 hr tablet, TAKE TWO (2) TABLETS BY MOUTH DAILY, Disp: 60 tablet, Rfl: 11 .  metoprolol succinate (TOPROL-XL) 25 MG 24 hr tablet, TAKE ONE-HALF TABLET BY MOUTH DAILY, Disp: 30  tablet, Rfl: 12 .  ONE TOUCH ULTRA TEST test strip, USE UP TO 3 TIMES DAILY AS DIRECTED, Disp: 100 each, Rfl: 5 .  sertraline (ZOLOFT) 25 MG tablet, TAKE ONE (1) TABLET BY MOUTH EVERY DAY, Disp: 30 tablet, Rfl: 12 .  traZODone (DESYREL) 50 MG tablet, TAKE 2 TO 3 TABLETS BY MOUTH AT BEDTIME, Disp: 60 tablet, Rfl: 12 .  valsartan-hydrochlorothiazide (DIOVAN-HCT) 320-12.5 MG tablet, TAKE ONE (1) TABLET BY MOUTH EVERY  DAY, Disp: 30 tablet, Rfl: 3  Review of Systems  Constitutional: Negative for appetite change, chills, fatigue and fever.  Respiratory: Negative for chest tightness, shortness of breath and wheezing.   Cardiovascular: Negative for chest pain and palpitations.  Gastrointestinal: Negative for abdominal pain, nausea and vomiting.  Neurological: Positive for headaches. Negative for dizziness and light-headedness.    Social History  Substance Use Topics  . Smoking status: Never Smoker  . Smokeless tobacco: Never Used  . Alcohol use No   Objective:   BP 128/80 (BP Location: Left Arm, Patient Position: Sitting, Cuff Size: Large)   Pulse (!) 51   Temp 98.5 F (36.9 C) (Oral)   Resp 18   Wt (!) 318 lb (144.2 kg)   SpO2 97% Comment: room air  BMI 43.13 kg/m     Physical Exam  General appearance: alert, well developed, well nourished, cooperative and in no distress Head: Normocephalic, without obvious abnormality, atraumatic Respiratory: Respirations even and unlabored, normal respiratory rate Extremities: No gross deformities Skin: Skin color, texture, turgor normal. No rashes seen  Psych: Appropriate mood and affect. Neurologic: Mental status: Alert, oriented to person, place, and time, thought content appropriate. . Results for orders placed or performed in visit on 09/11/16  POCT HgB A1C  Result Value Ref Range   Hemoglobin A1C 5.5    Est. average glucose Bld gHb Est-mCnc 111        Assessment & Plan:     1. Type 2 diabetes mellitus without complication, without long-term current use of insulin (HCC) Well controlled.  Continue current medications.   - POCT HgB A1C  2. Essential (primary) hypertension Doing well on current medication  3. Morbid obesity Doing very well with weight watchers and regular exercise program. Continue current program and follow up 4 months.     The entirety of the information documented in the History of Present Illness, Review of Systems  and Physical Exam were personally obtained by me. Portions of this information were initially documented by Awilda Billoshena Chambers, CMA and reviewed by me for thoroughness and accuracy.   Mila Merryonald Jaydyn Menon, MD  [facility]

## 2016-09-12 ENCOUNTER — Other Ambulatory Visit: Payer: Self-pay | Admitting: Family Medicine

## 2016-09-12 DIAGNOSIS — I10 Essential (primary) hypertension: Secondary | ICD-10-CM

## 2016-09-29 ENCOUNTER — Encounter: Payer: Self-pay | Admitting: Family Medicine

## 2016-09-29 ENCOUNTER — Ambulatory Visit (INDEPENDENT_AMBULATORY_CARE_PROVIDER_SITE_OTHER): Payer: Managed Care, Other (non HMO) | Admitting: Family Medicine

## 2016-09-29 DIAGNOSIS — L03314 Cellulitis of groin: Secondary | ICD-10-CM

## 2016-09-29 MED ORDER — SULFAMETHOXAZOLE-TRIMETHOPRIM 800-160 MG PO TABS: 2.0000 | ORAL_TABLET | Freq: Two times a day (BID) | ORAL | 0 refills | Status: AC

## 2016-09-29 NOTE — Patient Instructions (Addendum)
   Start using Hibiclens liquid soap from head to toe three times a week.

## 2016-09-29 NOTE — Progress Notes (Signed)
       Patient: [name] Male    DOB: 1970/08/08   46 y.o.   MRN: [id number] Visit Date: 09/29/2016  Today's Provider: Mila Merryonald Fisher, MD   Chief Complaint  Patient presents with  . cyst on groin area   Subjective:    HPI Patient comes in today c/o cyst in his groin area. He has had similar lesions  several months ago, and he was treated with Doxy which was effective and lesion resolved. He says that he has had this cyst for about 2 days. He denies any drainage, but does feel that it may drain soon.     Allergies  Allergen Reactions  . Amlodipine Besylate     Constipation, palitations, swelling, shortness of breath  . Augmentin [Amoxicillin-Pot Clavulanate]     intolerant: Causes insomnia  . Bactrim [Sulfamethoxazole-Trimethoprim] Hives    rash  . Cyclobenzaprine   . Doxycycline     Upset stomach  . Indomethacin Swelling    Caused Fluid retention  . Keflex  [Cephalexin]     intolerant  . Medrol [Methylprednisolone] Palpitations     Current Outpatient Prescriptions:  .  ibuprofen (ADVIL,MOTRIN) 800 MG tablet, TAKE ONE TABLET TWICE DAILY AS NEEDED, Disp: 60 tablet, Rfl: 1 .  metFORMIN (GLUCOPHAGE-XR) 500 MG 24 hr tablet, TAKE TWO (2) TABLETS BY MOUTH DAILY, Disp: 60 tablet, Rfl: 11 .  metoprolol succinate (TOPROL-XL) 25 MG 24 hr tablet, TAKE ONE-HALF TABLET BY MOUTH DAILY, Disp: 30 tablet, Rfl: 12 .  ONE TOUCH ULTRA TEST test strip, USE UP TO 3 TIMES DAILY AS DIRECTED, Disp: 100 each, Rfl: 5 .  sertraline (ZOLOFT) 25 MG tablet, TAKE ONE (1) TABLET BY MOUTH EVERY DAY, Disp: 30 tablet, Rfl: 12 .  traZODone (DESYREL) 50 MG tablet, TAKE 2 TO 3 TABLETS BY MOUTH AT BEDTIME, Disp: 60 tablet, Rfl: 12 .  valsartan-hydrochlorothiazide (DIOVAN-HCT) 320-12.5 MG tablet, TAKE ONE (1) TABLET EACH DAY, Disp: 30 tablet, Rfl: 12  Review of Systems  Constitutional: Negative.   Skin: Positive for color change and wound.  No fevers or chills.   Social History  Substance Use Topics  .  Smoking status: Never Smoker  . Smokeless tobacco: Never Used  . Alcohol use No   Objective:   BP 132/68 (BP Location: Left Arm, Patient Position: Sitting, Cuff Size: Large)   Temp 98 F (36.7 C)   Resp 16     Physical Exam  Right side of scrotum with inflamed tender mass with no discharge.     Assessment & Plan:     1. Cellulitis of groin  - sulfamethoxazole-trimethoprim (BACTRIM DS,SEPTRA DS) 800-160 MG tablet; Take 2 tablets by mouth 2 (two) times daily.  Dispense: 40 tablet; Refill: 0  Call if not rapidly improving or resolved when finished with antibiotics.          Mila Merryonald Fisher, MD  [facility]

## 2016-10-21 ENCOUNTER — Other Ambulatory Visit: Payer: Self-pay | Admitting: Family Medicine

## 2016-10-21 DIAGNOSIS — I10 Essential (primary) hypertension: Secondary | ICD-10-CM

## 2016-10-23 ENCOUNTER — Other Ambulatory Visit: Payer: Self-pay | Admitting: Family Medicine

## 2016-10-23 DIAGNOSIS — I10 Essential (primary) hypertension: Secondary | ICD-10-CM

## 2016-10-24 MED ORDER — IRBESARTAN-HYDROCHLOROTHIAZIDE 300-12.5 MG PO TABS: 1.0000 | ORAL_TABLET | Freq: Every day | ORAL | 5 refills | Status: DC

## 2016-11-27 DIAGNOSIS — M5020 Other cervical disc displacement, unspecified cervical region: Secondary | ICD-10-CM | POA: Insufficient documentation

## 2016-11-27 HISTORY — DX: Other cervical disc displacement, unspecified cervical region: M50.20

## 2016-12-03 ENCOUNTER — Other Ambulatory Visit: Payer: Self-pay | Admitting: Family Medicine

## 2016-12-03 DIAGNOSIS — G4700 Insomnia, unspecified: Secondary | ICD-10-CM

## 2016-12-18 ENCOUNTER — Encounter: Payer: Self-pay | Admitting: Family Medicine

## 2016-12-18 ENCOUNTER — Ambulatory Visit (INDEPENDENT_AMBULATORY_CARE_PROVIDER_SITE_OTHER): Payer: Managed Care, Other (non HMO) | Admitting: Family Medicine

## 2016-12-18 VITALS — BP 140/90 | HR 50 | Temp 98.9°F | Resp 16 | Wt 293.0 lb

## 2016-12-18 DIAGNOSIS — S46811A Strain of other muscles, fascia and tendons at shoulder and upper arm level, right arm, initial encounter: Secondary | ICD-10-CM | POA: Diagnosis not present

## 2016-12-18 MED ORDER — METHOCARBAMOL 500 MG PO TABS: ORAL_TABLET | ORAL | 0 refills | Status: DC

## 2016-12-18 NOTE — Patient Instructions (Addendum)
Discussed use of heat for 20 minutes several x day. Continue ibuprofen  and Tylenol.

## 2016-12-18 NOTE — Progress Notes (Signed)
Subjective:     Patient ID: [name], male   DOB: 06/04/1970, 46 y.o.   MRN: [id number] Chief Complaint  Patient presents with  . Shoulder Pain    Patient comes in office today with right shoulder for 1 week. Patient states that pain initally started of as a "knot in his upper back that raidiated to shoulder shooting down into his right wrist.   Reports tingling in his anterior shoulder area but no neck pain. He has addressed work Financial controllerergonomics but uses multiple pillows in his bed. Currently doing McKenzie exercises and use high dose ibuprofen and Tylenol. No prior hx of shoulder injury. HPI     Review of Systems     Objective:   Physical Exam  Constitutional: He appears well-developed and well-nourished. No distress.  Musculoskeletal:  Right shoulder ROM WNL with 5/5 strength. Cervical FROM without pain. Mild discomfort to palpation- "tightness"- over his upper right trap.        Assessment:    1. Trapezius strain, right, initial encounter - methocarbamol (ROBAXIN) 500 MG tablet; May use up to 4 x day for muscle spasms.  Dispense: 28 tablet; Refill: 0    Plan:    Add heat and check bed ergonomics. Continue current otc medication.

## 2016-12-30 ENCOUNTER — Encounter: Payer: Self-pay | Admitting: Family Medicine

## 2017-01-01 ENCOUNTER — Telehealth: Payer: Self-pay | Admitting: *Deleted

## 2017-01-01 NOTE — Telephone Encounter (Signed)
Per pt's request, changed his pharmacy to [facility].

## 2017-01-05 ENCOUNTER — Ambulatory Visit: Payer: Managed Care, Other (non HMO) | Admitting: Family Medicine

## 2017-01-05 ENCOUNTER — Encounter: Payer: Self-pay | Admitting: Family Medicine

## 2017-01-05 VITALS — BP 120/80 | HR 54 | Temp 98.4°F | Resp 16

## 2017-01-05 DIAGNOSIS — S66911S Strain of unspecified muscle, fascia and tendon at wrist and hand level, right hand, sequela: Secondary | ICD-10-CM

## 2017-01-05 MED ORDER — MELOXICAM 15 MG PO TABS: 15.0000 mg | ORAL_TABLET | Freq: Every day | ORAL | 0 refills | Status: AC

## 2017-01-05 NOTE — Progress Notes (Signed)
Patient: [name] Male    DOB: Oct 06, 1970   46 y.o.   MRN: [id number] Visit Date: 01/05/2017  Today's Provider: Mila Merryonald Fisher, MD   Chief Complaint  Patient presents with  . Shoulder Pain   Subjective:    HPI  Trapezius strain, right, initial encounter From 12/18/2016-seen by Micheal ArthursBob Wheeler. Prescribed methocarbamol (ROBAXIN) 500 MG tablet. States that pain has mostly improved, but has since developed pain in forearm and dorsum of right wrist wrist. Patient states that now he has some numbness in 2nd and third digits on right hand. 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  Patient has been taking otc ibuprofen up to 800mg  two times a day and tylenol with only mild relief.     Allergies  Allergen Reactions  . Amlodipine Besylate     Constipation, palitations, swelling, shortness of breath  . Augmentin [Amoxicillin-Pot Clavulanate]     intolerant: Causes insomnia  . Bactrim [Sulfamethoxazole-Trimethoprim] Hives    rash  . Cyclobenzaprine   . Doxycycline     Upset stomach  . Indomethacin Swelling    Caused Fluid retention  . Keflex  [Cephalexin]     intolerant  . Medrol [Methylprednisolone] Palpitations     Current Outpatient Medications:  .  ibuprofen (ADVIL,MOTRIN) 800 MG tablet, TAKE ONE TABLET TWICE DAILY AS NEEDED, Disp: 60 tablet, Rfl: 1 .  irbesartan-hydrochlorothiazide (AVALIDE) 300-12.5 MG tablet, Take 1 tablet by mouth daily., Disp: 30 tablet, Rfl: 5 .  metFORMIN (GLUCOPHAGE-XR) 500 MG 24 hr tablet, TAKE TWO (2) TABLETS BY MOUTH DAILY, Disp: 60 tablet, Rfl: 11 .  methocarbamol (ROBAXIN) 500 MG tablet, May use up to 4 x day for muscle spasms., Disp: 28 tablet, Rfl: 0 .  ONE TOUCH ULTRA TEST test strip, USE UP TO 3 TIMES DAILY AS DIRECTED, Disp: 100 each, Rfl: 5 .  sertraline (ZOLOFT) 25 MG tablet, TAKE ONE (1) TABLET BY MOUTH EVERY DAY, Disp: 30 tablet, Rfl: 12 .  traZODone (DESYREL) 50 MG tablet, TAKE 2 TO 3 TABLETS BY  MOUTH AT BEDTIME, Disp: 60 tablet, Rfl: 12 .  metoprolol succinate (TOPROL-XL) 25 MG 24 hr tablet, TAKE ONE-HALF TABLET BY MOUTH DAILY (Patient not taking: Reported on 01/05/2017), Disp: 30 tablet, Rfl: 12  Review of Systems  Constitutional: Negative for appetite change, chills and fever.  Respiratory: Negative for chest tightness, shortness of breath and wheezing.   Cardiovascular: Negative for chest pain and palpitations.  Gastrointestinal: Negative for abdominal pain, nausea and vomiting.    Social History   Tobacco Use  . Smoking status: Never Smoker  . Smokeless tobacco: Never Used  Substance Use Topics  . Alcohol use: No    Alcohol/week: 0.0 oz   Objective:   BP 120/80 (BP Location: Right Arm, Patient Position: Sitting, Cuff Size: Large)   Pulse (!) 54   Temp 98.4 F (36.9 C) (Oral)   Resp 16   SpO2 97%  Vitals:   01/05/17 1052  BP: 120/80  Pulse: (!) 54  Resp: 16  Temp: 98.4 F (36.9 C)  TempSrc: Oral  SpO2: 97%     Physical Exam  General appearance: alert, well developed, well nourished, cooperative and in no distress Head: Normocephalic, without obvious abnormality, atraumatic Respiratory: Respirations even and unlabored, normal respiratory rate Extremities: Tender dorsal aspect of right wrist and forearm. No pain of volar aspect of wrist, negative Phalen, negative Tinel's  Assessment & Plan:     1. Strain of right wrist, sequela No sign of active CTS. Likely strain from compensatory overuse of forearm and wrist when he had shoulder strain. He is to continue wrist brace and change from ibuprofen to meloxicam (MOBIC) 15 MG tablet; Take 1 tablet (15 mg total) daily by mouth.  Dispense: 30 tablet; Refill: 0  Call if symptoms change or if not rapidly improving.    The entirety of the information documented in the History of Present Illness, Review of Systems and Physical Exam were personally obtained by me. Portions of this information were initially  documented by [name], CMA and reviewed by me for thoroughness and accuracy.         Micheal Merryonald Fisher, MD  [facility]

## 2017-01-12 ENCOUNTER — Ambulatory Visit: Payer: Managed Care, Other (non HMO) | Admitting: Family Medicine

## 2017-01-12 ENCOUNTER — Encounter: Payer: Self-pay | Admitting: Family Medicine

## 2017-01-12 ENCOUNTER — Other Ambulatory Visit: Payer: Self-pay

## 2017-01-12 VITALS — BP 114/82 | HR 52 | Temp 98.2°F | Resp 16

## 2017-01-12 DIAGNOSIS — I10 Essential (primary) hypertension: Secondary | ICD-10-CM | POA: Diagnosis not present

## 2017-01-12 DIAGNOSIS — E119 Type 2 diabetes mellitus without complications: Secondary | ICD-10-CM | POA: Diagnosis not present

## 2017-01-12 LAB — POCT GLYCOSYLATED HEMOGLOBIN (HGB A1C)
Est. average glucose Bld gHb Est-mCnc: 97
Hemoglobin A1C: 5

## 2017-01-12 LAB — POCT UA - MICROALBUMIN: MICROALBUMIN (UR) POC: 20 mg/L

## 2017-01-12 MED ORDER — IRBESARTAN-HYDROCHLOROTHIAZIDE 150-12.5 MG PO TABS: 1.0000 | ORAL_TABLET | Freq: Every day | ORAL | 1 refills | Status: DC

## 2017-01-12 NOTE — Progress Notes (Signed)
po      Patient: [name] Male   [name] DOB: [birth year]   46 y.o.   MRN: [id number] Visit Date: 01/12/2017  Today's Provider: Mila Merryonald Fisher, MD   Chief Complaint  Patient presents with  . Follow-up  . Diabetes  . Hypertension   Subjective:    HPI   Diabetes Mellitus Type II, Follow-up:   Lab Results  Component Value Date   HGBA1C 5.0 01/12/2017   HGBA1C 5.5 09/11/2016   HGBA1C 7.2 05/11/2016   Last seen for diabetes 4 months ago.  Management since then includes; no changes. He reports good compliance with treatment. He is not having side effects. none Current symptoms include none and have been unchanged. Home blood sugar records: fasting range: 110  Episodes of hypoglycemia? no   Current Insulin Regimen: n/a Most Recent Eye Exam: 04/28/2016 Weight trend: decreasing steadily Prior visit with dietician: no Current diet: well balanced Current exercise: cardiovascular workout on exercise equipment  He continues on weight watchers diet and report weight is down to 290 Wt Readings from Last 3 Encounters:  12/18/16 293 lb (132.9 kg)  09/11/16 (!) 318 lb (144.2 kg)  06/14/16 (!) 343 lb (155.6 kg)    ----------------------------------------------------------------   Hypertension, follow-up:  BP Readings from Last 3 Encounters:  01/12/17 102/80  01/05/17 120/80  12/18/16 140/90    He was last seen for hypertension 4 months ago.  BP at that visit was 128/80. Management since that visit includes; no changes.He reports good compliance with treatment.  He states he stopped taking metoprolol due to hr getting into the mid 40s.  He is not having side effects. none He is exercising. He is adherent to low salt diet.   Outside blood pressures are not checking. He is experiencing none.  Patient denies none.   Cardiovascular risk factors include diabetes.  Use of agents associated with hypertension: none.    ----------------------------------------------------------------   Allergies  Allergen Reactions  . Amlodipine Besylate     Constipation, palitations, swelling, shortness of breath  . Augmentin [Amoxicillin-Pot Clavulanate]     intolerant: Causes insomnia  . Bactrim [Sulfamethoxazole-Trimethoprim] Hives    rash  . Cyclobenzaprine   . Doxycycline     Upset stomach  . Indomethacin Swelling    Caused Fluid retention  . Keflex  [Cephalexin]     intolerant  . Medrol [Methylprednisolone] Palpitations     Current Outpatient Medications:  .  ibuprofen (ADVIL,MOTRIN) 800 MG tablet, TAKE ONE TABLET TWICE DAILY AS NEEDED, Disp: 60 tablet, Rfl: 1 .  irbesartan-hydrochlorothiazide (AVALIDE) 300-12.5 MG tablet, Take 1 tablet by mouth daily., Disp: 30 tablet, Rfl: 5 .  meloxicam (MOBIC) 15 MG tablet, Take 1 tablet (15 mg total) daily by mouth., Disp: 30 tablet, Rfl: 0 .  metFORMIN (GLUCOPHAGE-XR) 500 MG 24 hr tablet, TAKE TWO (2) TABLETS BY MOUTH DAILY, Disp: 60 tablet, Rfl: 11 .  methocarbamol (ROBAXIN) 500 MG tablet, May use up to 4 x day for muscle spasms., Disp: 28 tablet, Rfl: 0 .  ONE TOUCH ULTRA TEST test strip, USE UP TO 3 TIMES DAILY AS DIRECTED, Disp: 100 each, Rfl: 5 .  sertraline (ZOLOFT) 25 MG tablet, TAKE ONE (1) TABLET BY MOUTH EVERY DAY, Disp: 30 tablet, Rfl: 12 .  traZODone (DESYREL) 50 MG tablet, TAKE 2 TO 3 TABLETS BY MOUTH AT BEDTIME, Disp: 60 tablet, Rfl: 12 .  metoprolol succinate (TOPROL-XL) 25 MG 24 hr tablet, TAKE ONE-HALF TABLET BY MOUTH DAILY (Patient not taking: Reported  on 01/12/2017), Disp: 30 tablet, Rfl: 12  Review of Systems  Constitutional: Negative for appetite change, chills and fever.  Respiratory: Negative for chest tightness, shortness of breath and wheezing.   Cardiovascular: Negative for chest pain and palpitations.  Gastrointestinal: Negative for abdominal pain, nausea and vomiting.    Social History   Tobacco Use  . Smoking status: Never  Smoker  . Smokeless tobacco: Never Used  Substance Use Topics  . Alcohol use: No    Alcohol/week: 0.0 oz   Objective:    Vitals:   01/12/17 0817 01/12/17 0843  BP: 102/80 114/82  Pulse: (!) 52   Resp: 16   Temp: 98.2 F (36.8 C)   TempSrc: Oral   SpO2: 97%      Physical Exam  General Appearance:    Alert, cooperative, no distress, obese  Eyes:    PERRL, conjunctiva/corneas clear, EOM's intact       Lungs:     Clear to auscultation bilaterally, respirations unlabored  Heart:    Regular rate and rhythm  Neurologic:   Awake, alert, oriented x 3. No apparent focal neurological           defect.       Results for orders placed or performed in visit on 01/12/17  POCT glycosylated hemoglobin (Hb A1C)  Result Value Ref Range   Hemoglobin A1C 5.0    Est. average glucose Bld gHb Est-mCnc 97   POCT UA - Microalbumin  Result Value Ref Range   Microalbumin Ur, POC 20 mg/L       Assessment & Plan:     1. Type 2 diabetes mellitus without complication, without long-term current use of insulin (HCC) Doing bery well with diet and exercise, will stop metformin - POCT glycosylated hemoglobin (Hb A1C) - POCT UA - Microalbumin  2. Essential (primary) hypertension Is off metoprolol, can restart 1/2 tablet daily if he starts having palpitations. Reduced irbesartan-hctc to 150-12.5 daily  3. Obesity, morbid (HCC)  Return in about 4 months (around 05/12/2017).        Mila Merryonald Fisher, MD  [facility]

## 2017-01-16 ENCOUNTER — Other Ambulatory Visit: Payer: Self-pay | Admitting: Family Medicine

## 2017-01-16 DIAGNOSIS — S46811A Strain of other muscles, fascia and tendons at shoulder and upper arm level, right arm, initial encounter: Secondary | ICD-10-CM

## 2017-01-17 MED ORDER — METHOCARBAMOL 500 MG PO TABS: ORAL_TABLET | ORAL | 0 refills | Status: DC

## 2017-01-26 DIAGNOSIS — M5412 Radiculopathy, cervical region: Secondary | ICD-10-CM

## 2017-01-26 HISTORY — DX: Radiculopathy, cervical region: M54.12

## 2017-02-13 ENCOUNTER — Other Ambulatory Visit: Payer: Self-pay

## 2017-02-13 DIAGNOSIS — Z299 Encounter for prophylactic measures, unspecified: Secondary | ICD-10-CM

## 2017-02-13 NOTE — Progress Notes (Signed)
Patient came in to get blood drawn for testing for immunization requirement for school that is starting in 3 weeks.

## 2017-02-14 LAB — MEASLES/MUMPS/RUBELLA IMMUNITY
MUMPS ABS, IGG: 23.4 [AU]/ml
RUBEOLA AB, IGG: <25 [AU]/ml — ABNORMAL LOW
Rubella Antibodies, IGG: <0.9 {index} — ABNORMAL LOW

## 2017-02-14 LAB — HEPATITIS B SURFACE ANTIBODY,QUALITATIVE: HEP B SURFACE AB, QUAL: NONREACTIVE

## 2017-02-21 ENCOUNTER — Ambulatory Visit: Payer: Self-pay | Admitting: Emergency Medicine

## 2017-02-21 DIAGNOSIS — Z299 Encounter for prophylactic measures, unspecified: Secondary | ICD-10-CM

## 2017-02-21 NOTE — Progress Notes (Signed)
Patient came in to have his MMR vaccine administered.

## 2017-02-23 ENCOUNTER — Telehealth: Payer: Self-pay | Admitting: Family Medicine

## 2017-02-23 NOTE — Telephone Encounter (Signed)
Patient was advised and given written order.

## 2017-02-23 NOTE — Telephone Encounter (Signed)
Patient needs order for a PPD test so he can take it to the [facility] to be tested.   Patient states he has to have a PPD because he is a diabetic and it is required through [employer] where he is attending school.  Patient is waiting in reception area in our office. He has appt. On Monday at 9:00 a.m. At clinic to have this performed.

## 2017-02-26 ENCOUNTER — Ambulatory Visit: Payer: Self-pay | Admitting: Family

## 2017-02-26 VITALS — BP 119/80 | HR 64 | Temp 98.5°F

## 2017-02-26 DIAGNOSIS — J069 Acute upper respiratory infection, unspecified: Secondary | ICD-10-CM

## 2017-02-26 DIAGNOSIS — Z299 Encounter for prophylactic measures, unspecified: Secondary | ICD-10-CM

## 2017-02-26 MED ORDER — BENZONATATE 200 MG PO CAPS: 200.0000 mg | ORAL_CAPSULE | Freq: Two times a day (BID) | ORAL | 0 refills | Status: DC | PRN

## 2017-02-26 NOTE — Progress Notes (Signed)
Subjective: [name] complains of four-day history of cough, nasal congestion, difficulty sleeping at night due to cough, malaise, symptoms began with a mild scratchy throat; he denies chest pain or shortness of breath  Objective vital signs stable, alert, NAD ENT TMs dull slightly injected, nasal mucosa swollen erythematous with rhinorrhea, pharynx hyperemic, neck- supple, without adenopathy heart: RSR lungs are clear  Assessment: URI Plan:Supportive measures discussed. Follow up prn not improving   rx tessalon for cough.    viral course discussed and supportive measures encouraged.

## 2017-02-26 NOTE — Addendum Note (Signed)
Addended by: Catha BrowEACON, Afnan Emberton T on: 02/26/2017 02:37 PM   Modules accepted: Orders

## 2017-02-28 LAB — TB SKIN TEST
Induration: 0 mm
TB SKIN TEST: NEGATIVE

## 2017-03-23 ENCOUNTER — Ambulatory Visit: Payer: Self-pay | Admitting: Emergency Medicine

## 2017-03-23 DIAGNOSIS — Z299 Encounter for prophylactic measures, unspecified: Secondary | ICD-10-CM

## 2017-03-23 NOTE — Progress Notes (Signed)
Patient came in to get his second MMR vaccine.

## 2017-03-26 ENCOUNTER — Other Ambulatory Visit: Payer: Self-pay

## 2017-03-30 HISTORY — PX: ANTERIOR CERVICAL DECOMP/DISCECTOMY FUSION: SHX1161

## 2017-04-05 ENCOUNTER — Ambulatory Visit (INDEPENDENT_AMBULATORY_CARE_PROVIDER_SITE_OTHER): Payer: Managed Care, Other (non HMO) | Admitting: Family Medicine

## 2017-04-05 ENCOUNTER — Encounter: Payer: Self-pay | Admitting: Family Medicine

## 2017-04-05 VITALS — BP 138/88 | HR 59 | Temp 98.8°F | Resp 16 | Ht 72.0 in | Wt 279.4 lb

## 2017-04-05 DIAGNOSIS — E119 Type 2 diabetes mellitus without complications: Secondary | ICD-10-CM | POA: Diagnosis not present

## 2017-04-05 DIAGNOSIS — I10 Essential (primary) hypertension: Secondary | ICD-10-CM | POA: Diagnosis not present

## 2017-04-05 DIAGNOSIS — Z0000 Encounter for general adult medical examination without abnormal findings: Secondary | ICD-10-CM | POA: Diagnosis not present

## 2017-04-05 NOTE — Progress Notes (Signed)
Patient: [name], Male    DOB: 02/25/1971, 47 y.o.   MRN: [id number] Visit Date: 04/05/2017  Today's Provider: Lelon Huh, MD   Chief Complaint  Patient presents with  . Annual Exam  . Hypertension  . Diabetes  . Obesity   Subjective:    Annual physical exam [name] is a 47 y.o. male who presents today for health maintenance and complete physical. He feels well. He reports no regular exercise. He reports he is sleeping fairly well. He scheduled for cervical discectomy by Dr. Arnoldo Morale on 04-18-2017.   -----------------------------------------------------------------  Diabetes Mellitus Type II, Follow-up:   Lab Results  Component Value Date   HGBA1C 5.0 01/12/2017   HGBA1C 5.5 09/11/2016   HGBA1C 7.2 05/11/2016   Last seen for diabetes 3 months ago.  Management since then includes; stopped metformin. Advised to continue diet and exercise. He reports good compliance with treatment. He is not having side effects.  Current symptoms include none and have been stable. Home blood sugar records: occasionally checks blood sugars; fasting ranging in the 90's  Episodes of hypoglycemia? no   Current Insulin Regimen: none Most Recent Eye Exam: 04/2016 Weight trend: decreasing steadily Prior visit with dietician: no Current diet: well balanced Current exercise: none  ------------------------------------------------------------------------   Hypertension, follow-up:  BP Readings from Last 3 Encounters:  02/26/17 119/80  01/12/17 114/82  01/05/17 120/80    He was last seen for hypertension 3 months ago.  BP at that visit was 114/82. Management since that visit includes; restart metoprolol 1/2 tablet qd if still having palpitations. Decreased Irbesartan-HCTZ to 150-12.5 mg qd.He reports good compliance with treatment. He is not having side effects.  He is not exercising. He is adherent to low salt diet.   Outside blood pressures are not being  checked. He is experiencing none.  Patient denies chest pain, chest pressure/discomfort, claudication, dyspnea, exertional chest pressure/discomfort, fatigue, irregular heart beat, lower extremity edema, near-syncope, orthopnea, palpitations, paroxysmal nocturnal dyspnea, syncope and tachypnea.   Cardiovascular risk factors include diabetes mellitus, hypertension, male gender and obesity (BMI >= 30 kg/m2).  Use of agents associated with hypertension: none.   ------------------------------------------------------------------------  Obesity, morbid ([place]) Continues on weight watchers diet and steadily losing weight. Feels well, Exercising when able.  Wt Readings from Last 3 Encounters:  04/05/17 279 lb 6.4 oz (126.7 kg)  12/18/16 293 lb (132.9 kg)  09/11/16 (!) 318 lb (144.2 kg)     Review of Systems  Constitutional: Negative for appetite change, chills, fatigue and fever.  HENT: Negative for congestion, ear pain, hearing loss, nosebleeds and trouble swallowing.   Eyes: Negative for pain and visual disturbance.  Respiratory: Negative for cough, chest tightness and shortness of breath.   Cardiovascular: Negative for chest pain, palpitations and leg swelling.  Gastrointestinal: Negative for abdominal pain, blood in stool, constipation, diarrhea, nausea and vomiting.  Endocrine: Negative for polydipsia, polyphagia and polyuria.  Genitourinary: Negative for dysuria and flank pain.  Musculoskeletal: Positive for gait problem and neck pain. Negative for arthralgias, back pain, joint swelling, myalgias and neck stiffness.  Skin: Negative for color change, rash and wound.  Neurological: Positive for numbness and headaches. Negative for dizziness, tremors, seizures, speech difficulty, weakness and light-headedness.  Psychiatric/Behavioral: Negative for behavioral problems, confusion, decreased concentration, dysphoric mood and sleep disturbance. The patient is not nervous/anxious.   All other  systems reviewed and are negative.   Social History      He  reports  that  has never smoked. he has never used smokeless tobacco. He reports that he does not drink alcohol or use drugs.       Social History   Socioeconomic History  . Marital status: Married    Spouse name: Not on file  . Number of children: 0  . Years of education: Not on file  . Highest education level: Not on file  Social Needs  . Financial resource strain: Not on file  . Food insecurity - worry: Not on file  . Food insecurity - inability: Not on file  . Transportation needs - medical: Not on file  . Transportation needs - non-medical: Not on file  Occupational History  . Occupation: [employer]    Comment: Employed  Tobacco Use  . Smoking status: Never Smoker  . Smokeless tobacco: Never Used  Substance and Sexual Activity  . Alcohol use: No    Alcohol/week: 0.0 oz  . Drug use: No  . Sexual activity: Yes    Birth control/protection: None  Other Topics Concern  . Not on file  Social History Narrative  . Not on file    Past Medical History:  Diagnosis Date  . History of hydrocele      Patient Active Problem List   Diagnosis Date Noted  . Allergic rhinitis due to pollen 05/11/2016  . Hyperuricemia 01/12/2016  . Gout 09/15/2015  . Bradycardia 09/16/2014  . Left knee pain 09/16/2014  . Palpitations 09/16/2014  . Panic attacks 09/16/2014  . Tachycardia 09/16/2014  . Arthralgia of hip or thigh 03/21/2009  . Anxiety disorder 07/07/2008  . Disorder of skin 03/18/2008  . Proteinuria 10/22/2006  . Essential (primary) hypertension 06/11/2006  . Obesity, morbid ([place]) 06/11/2006  . Diabetes mellitus, type 2 ([place]) 01/28/2000  . Insomnia 05/03/1999    Past Surgical History:  Procedure Laterality Date  . KNEE SURGERY  2006   left knee reconstruction after tibial fracture  . LEG SURGERY  1975   leg and foot surgery; right lower leg circa    Family History        Family Status   Relation Name Status  . Mother  Alive  . Father  Deceased at age 40       Cause of Death: Stomach and pancreatic cancer  . Sister HALF Alive  . Brother HALF Alive  . Other Gen. family hx (Not Specified)  . Neg Hx  (Not Specified)        His family history includes Alcohol abuse in his father; Anxiety disorder in his other; Drug abuse in his sister; Healthy in his brother; Hyperlipidemia in his mother; Lung cancer in his father and other; Pancreatic cancer in his father; Panic disorder in his mother; Prostate cancer in his other; Stomach cancer in his father. There is no history of Colon cancer or Heart disease.   Allergies  Allergen Reactions  . Doxycycline Rash  . Amlodipine Besylate     Constipation, palitations, swelling, shortness of breath  . Augmentin [Amoxicillin-Pot Clavulanate]     intolerant: Causes insomnia  . Cyclobenzaprine   . Indomethacin Swelling    Caused Fluid retention  . Keflex  [Cephalexin]     intolerant  . Medrol [Methylprednisolone] Palpitations     Current Outpatient Medications:  .  acetaminophen (TYLENOL) 500 MG tablet, Take 500 mg by mouth every 6 (six) hours as needed., Disp: , Rfl:  .  benzonatate (TESSALON) 200 MG capsule, Take 1 capsule (200 mg total) by mouth  2 (two) times daily as needed for cough., Disp: 20 capsule, Rfl: 0 .  irbesartan-hydrochlorothiazide (AVALIDE) 150-12.5 MG tablet, Take 1 tablet daily by mouth., Disp: 90 tablet, Rfl: 1 .  meloxicam (MOBIC) 15 MG tablet, Take 1 tablet by mouth daily., Disp: , Rfl:  .  methocarbamol (ROBAXIN) 500 MG tablet, May use up to 4 x day for muscle spasms., Disp: 28 tablet, Rfl: 0 .  ONE TOUCH ULTRA TEST test strip, USE UP TO 3 TIMES DAILY AS DIRECTED, Disp: 100 each, Rfl: 5 .  sertraline (ZOLOFT) 25 MG tablet, TAKE ONE (1) TABLET BY MOUTH EVERY DAY, Disp: 30 tablet, Rfl: 12 .  traMADol (ULTRAM) 50 MG tablet, Take by mouth every 6 (six) hours as needed for moderate pain., Disp: , Rfl:  .  traZODone  (DESYREL) 50 MG tablet, TAKE 2 TO 3 TABLETS BY MOUTH AT BEDTIME, Disp: 60 tablet, Rfl: 12 .  ibuprofen (ADVIL,MOTRIN) 800 MG tablet, TAKE ONE TABLET TWICE DAILY AS NEEDED (Patient not taking: Reported on 04/05/2017), Disp: 60 tablet, Rfl: 1   Patient Care Team: Birdie Sons, MD as PCP - General (Family Medicine) Troxler, Rodman Key, DPM as Attending Physician (Podiatry) Anell Barr, OD (Optometry)      Objective:   Vitals: BP 138/88 (BP Location: Left Arm, Patient Position: Sitting, Cuff Size: Large)   Pulse (!) 59   Temp 98.8 F (37.1 C) (Oral)   Resp 16   Ht 6' (1.829 m)   Wt 279 lb 6.4 oz (126.7 kg) Comment: per patient report  BMI 37.89 kg/m      Physical Exam   General Appearance:    Alert, cooperative, no distress, appears stated age, obese  Head:    Normocephalic, without obvious abnormality, atraumatic  Eyes:    PERRL, conjunctiva/corneas clear, EOM's intact, fundi    benign, both eyes       Ears:    Normal TM's and external ear canals, both ears  Nose:   Nares normal, septum midline, mucosa normal, no drainage   or sinus tenderness  Throat:   Lips, mucosa, and tongue normal; teeth and gums normal  Neck:   Supple, symmetrical, trachea midline, no adenopathy;       thyroid:  No enlargement/tenderness/nodules; no carotid   bruit or JVD  Back:     Symmetric, no curvature, ROM normal, no CVA tenderness  Lungs:     Clear to auscultation bilaterally, respirations unlabored  Chest wall:    No tenderness or deformity  Heart:    Regular rate and rhythm, S1 and S2 normal, no murmur, rub   or gallop  Abdomen:     Soft, non-tender, bowel sounds active all four quadrants,    no masses, no organomegaly  Genitalia:    deferred  Rectal:    deferred  Extremities:   Extremities normal, atraumatic, no cyanosis or edema  Pulses:   2+ and symmetric all extremities  Skin:   Skin color, texture, turgor normal, no rashes or lesions  Lymph nodes:   Cervical, supraclavicular, and  axillary nodes normal  Neurologic:   CNII-XII intact. Normal strength, sensation and reflexes      throughout    Depression Screen PHQ 2/9 Scores 04/05/2017 03/30/2016 12/01/2014  PHQ - 2 Score 1 0 1  PHQ- 9 Score _0 Assessment & Plan:     Routine Health Maintenance and Physical Exam  Exercise Activities and Dietary recommendations Goals    None  Immunization History  Administered Date(s) Administered  . Influenza-Unspecified 11/28/2014, 12/11/2016  . MMR 02/21/2017, 03/23/2017  . PPD Test 02/26/2017  . Pneumococcal Polysaccharide-23 01/08/2013  . Td 10/29/1998  . Tdap 10/02/2007    Health Maintenance  Topic Date Due  . FOOT EXAM  12/06/1980  . HIV Screening  12/06/1985  . OPHTHALMOLOGY EXAM  04/28/2017  . HEMOGLOBIN A1C  07/12/2017  . TETANUS/TDAP  10/01/2017  . PNEUMOCOCCAL POLYSACCHARIDE VACCINE (2) 01/08/2018  . INFLUENZA VACCINE  Completed     Discussed health benefits of physical activity, and encouraged him to engage in regular exercise appropriate for his age and condition.    --------------------------------------------------------------------.  1. Annual physical exam Doing well with diet and exercise. In general good health. Normal EKG. Low risk for complications of surgery and anesthesia.   2. Type 2 diabetes mellitus without complication, without long-term current use of insulin ([place]) Doing well with diet off of metformin. Will check a1c at follow up visit in march.   3. Essential (primary) hypertension Well controlled.  Continue current medications.   - EKG 12-Lead    Lelon Huh, MD  [facility]

## 2017-04-05 NOTE — Patient Instructions (Signed)

## 2017-05-01 LAB — HM DIABETES EYE EXAM

## 2017-05-07 ENCOUNTER — Other Ambulatory Visit: Payer: Self-pay

## 2017-05-07 DIAGNOSIS — Z23 Encounter for immunization: Secondary | ICD-10-CM

## 2017-05-08 LAB — MEASLES/MUMPS/RUBELLA IMMUNITY
MUMPS ABS, IGG: 191 [AU]/ml (ref >10.9)
RUBELLA: 4.78 {index} (ref >0.99)

## 2017-05-10 NOTE — Progress Notes (Signed)
FYI mutual patient.

## 2017-05-11 ENCOUNTER — Telehealth: Payer: Self-pay

## 2017-05-11 NOTE — Telephone Encounter (Signed)
-----   Message from Malva Limes, MD sent at 05/10/2017  6:09 PM EDT ----- Patient has immunity to measles, mumps and rubella

## 2017-05-11 NOTE — Telephone Encounter (Signed)
[id number] 

## 2017-05-14 ENCOUNTER — Ambulatory Visit: Payer: Managed Care, Other (non HMO) | Admitting: Family Medicine

## 2017-05-14 ENCOUNTER — Encounter: Payer: Self-pay | Admitting: Family Medicine

## 2017-05-14 VITALS — BP 110/82 | HR 86 | Temp 98.6°F | Resp 16

## 2017-05-14 DIAGNOSIS — I10 Essential (primary) hypertension: Secondary | ICD-10-CM

## 2017-05-14 DIAGNOSIS — E119 Type 2 diabetes mellitus without complications: Secondary | ICD-10-CM

## 2017-05-14 LAB — POCT GLYCOSYLATED HEMOGLOBIN (HGB A1C)
ESTIMATED AVERAGE GLUCOSE: 101
Hemoglobin A1C: 5.1

## 2017-05-14 NOTE — Telephone Encounter (Signed)
Advised patient as below. Is there a way for you to "release" information so that the patient can view this on mychart? Please advise. Thanks!

## 2017-05-14 NOTE — Telephone Encounter (Signed)
done

## 2017-05-14 NOTE — Progress Notes (Signed)
Patient: Micheal FriezeBrian S Froh Male    DOB: 1970-05-23   47 y.o.   MRN: [id number] Visit Date: 05/14/2017  Today's Provider: Mila Merryonald Joven Mom, MD   Chief Complaint  Patient presents with  . Diabetes    follow up  . Hypertension    follow up   Subjective:    HPI   Diabetes Mellitus Type II, Follow-up:   Lab Results  Component Value Date   HGBA1C 5.0 01/12/2017   HGBA1C 5.5 09/11/2016   HGBA1C 7.2 05/11/2016   Last seen for diabetes 1 months ago.  Management since then includes; no changes. He reports good compliance with treatment. He is not having side effects.  Current symptoms include none and have been stable. Home blood sugar records: blood sugars are not being checked  Episodes of hypoglycemia? no   Current Insulin Regimen: none Most Recent Eye Exam: 1 week ago (Dr. Clydene PughWoodard) Weight trend: fluctuating a bit per patient report Prior visit with dietician: no Current diet: in general, a "healthy" diet   Current exercise: walking  ------------------------------------------------------------------------   Hypertension, follow-up:  BP Readings from Last 3 Encounters:  04/05/17 138/88  02/26/17 119/80  01/12/17 114/82    He was last seen for hypertension 1 months ago.  BP at that visit was 138/88. Management since that visit includes; no changes.He reports good compliance with treatment. He is not having side effects.  He is exercising. He is adherent to low salt diet.   Outside blood pressures are not being checked. He is experiencing none.  Patient denies chest pain, chest pressure/discomfort, claudication, dyspnea, exertional chest pressure/discomfort, fatigue, irregular heart beat, lower extremity edema, near-syncope, orthopnea, palpitations, paroxysmal nocturnal dyspnea, syncope and tachypnea.   Cardiovascular risk factors include diabetes mellitus, hypertension, male gender and obesity (BMI >= 30 kg/m2).  Use of agents associated with hypertension: none.    ------------------------------------------------------------------------    Allergies  Allergen Reactions  . Doxycycline Rash  . Amlodipine Besylate     Constipation, palitations, swelling, shortness of breath  . Augmentin [Amoxicillin-Pot Clavulanate]     intolerant: Causes insomnia  . Cyclobenzaprine   . Indomethacin Swelling    Caused Fluid retention  . Keflex  [Cephalexin]     intolerant  . Medrol [Methylprednisolone] Palpitations     Current Outpatient Medications:  .  acetaminophen (TYLENOL) 500 MG tablet, Take 500 mg by mouth every 6 (six) hours as needed., Disp: , Rfl:  .  irbesartan-hydrochlorothiazide (AVALIDE) 150-12.5 MG tablet, Take 1 tablet daily by mouth., Disp: 90 tablet, Rfl: 1 .  methocarbamol (ROBAXIN) 500 MG tablet, May use up to 4 x day for muscle spasms., Disp: 28 tablet, Rfl: 0 .  ONE TOUCH ULTRA TEST test strip, USE UP TO 3 TIMES DAILY AS DIRECTED, Disp: 100 each, Rfl: 5 .  sertraline (ZOLOFT) 25 MG tablet, TAKE ONE (1) TABLET BY MOUTH EVERY DAY, Disp: 30 tablet, Rfl: 12 .  traZODone (DESYREL) 50 MG tablet, TAKE 2 TO 3 TABLETS BY MOUTH AT BEDTIME, Disp: 60 tablet, Rfl: 12 .  ibuprofen (ADVIL,MOTRIN) 800 MG tablet, TAKE ONE TABLET TWICE DAILY AS NEEDED (Patient not taking: Reported on 05/14/2017), Disp: 60 tablet, Rfl: 1 .  meloxicam (MOBIC) 15 MG tablet, Take 1 tablet by mouth daily., Disp: , Rfl:  .  traMADol (ULTRAM) 50 MG tablet, Take by mouth every 6 (six) hours as needed for moderate pain., Disp: , Rfl:   Review of Systems  Constitutional: Negative for appetite change, chills and  fever.  Respiratory: Negative for chest tightness, shortness of breath and wheezing.   Cardiovascular: Negative for chest pain and palpitations.  Gastrointestinal: Negative for abdominal pain, nausea and vomiting.    Social History   Tobacco Use  . Smoking status: Never Smoker  . Smokeless tobacco: Never Used  Substance Use Topics  . Alcohol use: No     Alcohol/week: 0.0 oz   Objective:   BP 110/82 (BP Location: Left Arm, Patient Position: Sitting, Cuff Size: Large)   Pulse 86   Temp 98.6 F (37 C) (Oral)   Resp 16   SpO2 98% Comment: room air There were no vitals filed for this visit.   Physical Exam   General Appearance:    Alert, cooperative, no distress  Eyes:    PERRL, conjunctiva/corneas clear, EOM's intact       Lungs:     Clear to auscultation bilaterally, respirations unlabored  Heart:    Regular rate and rhythm  Neurologic:   Awake, alert, oriented x 3. No apparent focal neurological           defect.       Results for orders placed or performed in visit on 05/14/17  POCT HgB A1C  Result Value Ref Range   Hemoglobin A1C 5.1    Est. average glucose Bld gHb Est-mCnc 101        Assessment & Plan:     1. Type 2 diabetes mellitus without complication, without long-term current use of insulin (HCC) Diet controlled.  - POCT HgB A1C - Comprehensive metabolic panel - Lipid panel  2. Essential (primary) hypertension Well controlled. Continue current medications.   - Comprehensive metabolic panel - Lipid panel  Return in about 6 months (around 11/14/2017).       [name], MD  [facility]

## 2017-05-15 LAB — LIPID PANEL
Chol/HDL Ratio: 2.7 ratio (ref 0.0–5.0)
Cholesterol, Total: 129 mg/dL (ref 100–199)
HDL: 48 mg/dL (ref >39)
LDL Calculated: 67 mg/dL (ref 0–99)
Triglycerides: 71 mg/dL (ref 0–149)
VLDL Cholesterol Cal: 14 mg/dL (ref 5–40)

## 2017-05-15 LAB — COMPREHENSIVE METABOLIC PANEL
ALT: 17 IU/L (ref 0–44)
AST: 17 IU/L (ref 0–40)
Albumin/Globulin Ratio: 1.7 (ref 1.2–2.2)
Albumin: 4.2 g/dL (ref 3.5–5.5)
Alkaline Phosphatase: 65 IU/L (ref 39–117)
BILIRUBIN TOTAL: 0.7 mg/dL (ref 0.0–1.2)
BUN/Creatinine Ratio: 14 (ref 9–20)
BUN: 11 mg/dL (ref 6–24)
CALCIUM: 9.4 mg/dL (ref 8.7–10.2)
CHLORIDE: 102 mmol/L (ref 96–106)
CO2: 24 mmol/L (ref 20–29)
Creatinine, Ser: 0.78 mg/dL (ref 0.76–1.27)
GFR, EST AFRICAN AMERICAN: 125 mL/min/{1.73_m2} (ref >59)
GFR, EST NON AFRICAN AMERICAN: 108 mL/min/{1.73_m2} (ref >59)
GLUCOSE: 108 mg/dL — AB (ref 65–99)
Globulin, Total: 2.5 g/dL (ref 1.5–4.5)
Potassium: 4.1 mmol/L (ref 3.5–5.2)
Sodium: 141 mmol/L (ref 134–144)
TOTAL PROTEIN: 6.7 g/dL (ref 6.0–8.5)

## 2017-07-02 ENCOUNTER — Other Ambulatory Visit: Payer: Self-pay | Admitting: Family Medicine

## 2017-09-30 ENCOUNTER — Other Ambulatory Visit: Payer: Self-pay | Admitting: Family Medicine

## 2017-11-15 ENCOUNTER — Encounter: Payer: Self-pay | Admitting: Family Medicine

## 2017-11-15 ENCOUNTER — Ambulatory Visit (INDEPENDENT_AMBULATORY_CARE_PROVIDER_SITE_OTHER): Payer: Managed Care, Other (non HMO) | Admitting: Family Medicine

## 2017-11-15 VITALS — BP 130/72 | HR 52 | Temp 98.7°F | Resp 18 | Wt 288.0 lb

## 2017-11-15 DIAGNOSIS — G4700 Insomnia, unspecified: Secondary | ICD-10-CM | POA: Diagnosis not present

## 2017-11-15 DIAGNOSIS — Z23 Encounter for immunization: Secondary | ICD-10-CM

## 2017-11-15 DIAGNOSIS — E119 Type 2 diabetes mellitus without complications: Secondary | ICD-10-CM | POA: Diagnosis not present

## 2017-11-15 DIAGNOSIS — I10 Essential (primary) hypertension: Secondary | ICD-10-CM | POA: Diagnosis not present

## 2017-11-15 DIAGNOSIS — K589 Irritable bowel syndrome without diarrhea: Secondary | ICD-10-CM

## 2017-11-15 DIAGNOSIS — F413 Other mixed anxiety disorders: Secondary | ICD-10-CM

## 2017-11-15 LAB — POCT GLYCOSYLATED HEMOGLOBIN (HGB A1C)
Est. average glucose Bld gHb Est-mCnc: <100
HEMOGLOBIN A1C: 4.9 % (ref 4.0–5.6)

## 2017-11-15 NOTE — Patient Instructions (Addendum)
Continue probiotics and fiber supplements. Avoid dairy and high gluten foods.

## 2017-11-15 NOTE — Progress Notes (Signed)
Patient: [name] Male    DOB: Mar 27, 1970   47 y.o.   MRN: [id number] Visit Date: 11/15/2017  Today's Provider: [name], MD   Chief Complaint  Patient presents with  . Diabetes  . Hypertension   Subjective:    HPI  Diabetes Mellitus Type II, Follow-up:   Lab Results  Component Value Date   HGBA1C 5.1 05/14/2017   HGBA1C 5.0 01/12/2017   HGBA1C 5.5 09/11/2016    Last seen for diabetes 6 months ago.  Management since then includes no changes. He reports good compliance with treatment. He is not having side effects.  Current symptoms include none and have been stable. Home blood sugar records: blood surgars are not checked regularly at home  Episodes of hypoglycemia? no   Current Insulin Regimen: none Most Recent Eye Exam: <1 year ago Weight trend: fluctuating a bit Prior visit with dietician: no Current diet: well balanced Current exercise: none  Pertinent Labs:    Component Value Date/Time   CHOL 129 05/14/2017 0857   TRIG 71 05/14/2017 0857   HDL 48 05/14/2017 0857   LDLCALC 67 05/14/2017 0857   CREATININE 0.78 05/14/2017 0857   CREATININE 1.07 12/09/2013 0011    Wt Readings from Last 10 Encounters:  11/15/17 288 lb (130.6 kg)  04/05/17 279 lb 6.4 oz (126.7 kg)  12/18/16 293 lb (132.9 kg)  09/11/16 (!) 318 lb (144.2 kg)  06/14/16 (!) 343 lb (155.6 kg)  05/24/16 (!) 340 lb (154.2 kg)  05/11/16 (!) 340 lb (154.2 kg)  03/30/16 (!) 342 lb (155.1 kg)  03/07/16 (!) 342 lb (155.1 kg)  02/07/16 (!) 345 lb (156.5 kg)     ------------------------------------------------------------------------  Hypertension, follow-up:  BP Readings from Last 3 Encounters:  11/15/17 130/72  05/14/17 110/82  04/05/17 138/88    He was last seen for hypertension 6 months ago.  BP at that visit was 110/82. Management since that visit includes no changes. He reports good compliance with treatment. He is not having side effects.  He is not  exercising. He is adherent to low salt diet.   Outside blood pressures are not being checked. He is experiencing none.  Patient denies chest pain, chest pressure/discomfort, claudication, dyspnea, exertional chest pressure/discomfort, fatigue, irregular heart beat, lower extremity edema, near-syncope, orthopnea, palpitations, paroxysmal nocturnal dyspnea, syncope and tachypnea.   Cardiovascular risk factors include diabetes mellitus, hypertension and male gender.  Use of agents associated with hypertension: none.     Weight trend: fluctuating a bit Wt Readings from Last 3 Encounters:  11/15/17 288 lb (130.6 kg)  04/05/17 279 lb 6.4 oz (126.7 kg)  12/18/16 293 lb (132.9 kg)    Current diet: well balanced  ------------------------------------------------------------------------  He also reports he had severe stomach virus back in July and every since then his stools have been granular and has diarrhea with nearly every bowel movement. No blood no pains or cramping. No black stool. He has started taking fiber supplement and pro-biotics which seem to have helped.   Follow up depression/anxiety Has had a little more stress since working on doctoral, and been somewhat depressed since a co-worked died earlier this year of colon cancer. Has been seeing counselor which is helping. Is only taking 1/2 tablet of sertraline which he finds to be effective, but has considered going up to 1 full tablets. Continue on trazodone to help sleep which has been effective.     Allergies  Allergen Reactions  . Doxycycline  Rash  . Amlodipine Besylate     Constipation, palitations, swelling, shortness of breath  . Augmentin [Amoxicillin-Pot Clavulanate]     intolerant: Causes insomnia  . Cyclobenzaprine   . Indomethacin Swelling    Caused Fluid retention  . Keflex  [Cephalexin]     intolerant  . Medrol [Methylprednisolone] Palpitations     Current Outpatient Medications:  .  acetaminophen (TYLENOL)  500 MG tablet, Take 500 mg by mouth every 6 (six) hours as needed., Disp: , Rfl:  .  ibuprofen (ADVIL,MOTRIN) 800 MG tablet, TAKE ONE TABLET TWICE DAILY AS NEEDED, Disp: 60 tablet, Rfl: 1 .  irbesartan-hydrochlorothiazide (AVALIDE) 150-12.5 MG tablet, TAKE 1 TABLET BY MOUTH DAILY, Disp: 90 tablet, Rfl: 4 .  ONE TOUCH ULTRA TEST test strip, USE UP TO 3 TIMES DAILY AS DIRECTED, Disp: 100 each, Rfl: 5 .  sertraline (ZOLOFT) 25 MG tablet, TAKE 1 TABLET BY MOUTH EVERY DAY (Patient taking differently: 12.5 mg. TAKE 1 TABLET BY MOUTH EVERY DAY), Disp: 30 tablet, Rfl: 12 .  traZODone (DESYREL) 50 MG tablet, TAKE 2 TO 3 TABLETS BY MOUTH AT BEDTIME, Disp: 60 tablet, Rfl: 12  Review of Systems  Constitutional: Negative for appetite change, chills and fever.  Respiratory: Negative for chest tightness, shortness of breath and wheezing.   Cardiovascular: Negative for chest pain and palpitations.  Gastrointestinal: Negative for abdominal pain, nausea and vomiting.    Social History   Tobacco Use  . Smoking status: Never Smoker  . Smokeless tobacco: Never Used  Substance Use Topics  . Alcohol use: No    Alcohol/week: 0.0 standard drinks   Objective:   BP 130/72 (BP Location: Right Arm, Patient Position: Sitting)   Pulse (!) 52   Temp 98.7 F (37.1 C) (Oral)   Resp 18   Wt 288 lb (130.6 kg)   SpO2 98% Comment: room air  BMI 39.06 kg/m  Vitals:   11/15/17 0813  BP: 130/72  Pulse: (!) 52  Resp: 18  Temp: 98.7 F (37.1 C)  TempSrc: Oral  SpO2: 98%  Weight: 288 lb (130.6 kg)     Physical Exam  General Appearance:    Alert, cooperative, no distress, obese  Eyes:    PERRL, conjunctiva/corneas clear, EOM's intact       Lungs:     Clear to auscultation bilaterally, respirations unlabored  Heart:    Regular rate and rhythm  Neurologic:   Awake, alert, oriented x 3. No apparent focal neurological           defect.       Results for orders placed or performed in visit on 11/15/17  POCT  HgB A1C  Result Value Ref Range   Hemoglobin A1C 4.9 4.0 - 5.6 %   Est. average glucose Bld gHb Est-mCnc <100        Assessment & Plan:     1. Type 2 diabetes mellitus without complication, without long-term current use of insulin (HCC) a1c normal now, since dramatic weight loss over the last few years. Continue current diet.  - POCT HgB A1C  2. Irritable bowel syndrome, unspecified type Recent onset after episode of gastroenteritis. Is slowly improving. Avoid dairy and high gluten foods for awhile. Consider xifaxin or metronidazole if not resolved in the next few months.   3. Essential (primary) hypertension Well controlled. Ccm   4. Insomnia, unspecified type Doing well with trazodone.   5. Other mixed anxiety disorders Has been a little worse lately. Continue counseling. May go  back up to 1 full 25mg  sertraline.   6. Need for prophylactic vaccination using tetanus and diphtheria toxoids adsorbed (Td) vaccine  - Td : Tetanus/diphtheria >7yo Preservative  free  Return in about 6 months (around 05/16/2018) for Yearly Physical.       Mila Merryonald Sherrica Niehaus, MD  [facility]

## 2018-01-07 ENCOUNTER — Other Ambulatory Visit: Payer: Self-pay | Admitting: Family Medicine

## 2018-01-07 DIAGNOSIS — G4700 Insomnia, unspecified: Secondary | ICD-10-CM

## 2018-02-24 ENCOUNTER — Encounter: Payer: Self-pay | Admitting: Family Medicine

## 2018-03-10 ENCOUNTER — Encounter: Payer: Self-pay | Admitting: Family Medicine

## 2018-03-11 ENCOUNTER — Other Ambulatory Visit: Payer: Self-pay | Admitting: Family Medicine

## 2018-03-11 MED ORDER — IRBESARTAN-HYDROCHLOROTHIAZIDE 150-12.5 MG PO TABS: 2.0000 | ORAL_TABLET | Freq: Every day | ORAL | 4 refills | Status: DC

## 2018-03-11 NOTE — Progress Notes (Signed)
I've sent a new prescription for 180 tablets to [facility] in [place]. Let me know if there is any trouble getting prescription filled.   Have a great day,  Dr. Sherrie Mustache

## 2018-05-16 ENCOUNTER — Encounter: Payer: Self-pay | Admitting: Family Medicine

## 2018-07-08 ENCOUNTER — Encounter: Payer: Self-pay | Admitting: Family Medicine

## 2018-07-10 ENCOUNTER — Telehealth: Payer: Self-pay | Admitting: Family Medicine

## 2018-07-10 NOTE — Telephone Encounter (Signed)
Can't do e-visit for physicals. We can change his CPE to a diabetes follow up e-visit. He can do a drive up [id number] if he likes The rest of his routine labs will have to be done when he comes in for his physical

## 2018-07-11 NOTE — Telephone Encounter (Signed)
[id number] 

## 2018-07-12 NOTE — Telephone Encounter (Signed)
Patient was advised.  

## 2018-07-15 ENCOUNTER — Other Ambulatory Visit: Payer: Self-pay

## 2018-07-15 ENCOUNTER — Ambulatory Visit (INDEPENDENT_AMBULATORY_CARE_PROVIDER_SITE_OTHER): Payer: Managed Care, Other (non HMO) | Admitting: Family Medicine

## 2018-07-15 ENCOUNTER — Encounter: Payer: Self-pay | Admitting: Family Medicine

## 2018-07-15 VITALS — BP 113/77 | HR 72 | Temp 98.9°F | Ht 72.0 in | Wt 298.0 lb

## 2018-07-15 DIAGNOSIS — Z0000 Encounter for general adult medical examination without abnormal findings: Secondary | ICD-10-CM

## 2018-07-15 DIAGNOSIS — I10 Essential (primary) hypertension: Secondary | ICD-10-CM

## 2018-07-15 DIAGNOSIS — E119 Type 2 diabetes mellitus without complications: Secondary | ICD-10-CM | POA: Diagnosis not present

## 2018-07-15 NOTE — Progress Notes (Signed)
Patient: [name], Male    DOB: [birth year], 48 y.o.   MRN: [id number] Visit Date: 07/15/2018  Today's Provider: Lelon Huh, MD   Chief Complaint  Patient presents with  . Annual Exam   Subjective:     Annual physical exam [name] is a 48 y.o. male who presents today for health maintenance and complete physical. He feels well. He reports not exercising. He reports he is sleeping well.  Pt states he is off all his sleeping medications and is doing well.  ----------------------------------------------------------------- .  Diabetes Mellitus Type II, Follow-up:   Lab Results  Component Value Date   HGBA1C 4.9 11/15/2017   HGBA1C 5.1 05/14/2017   HGBA1C 5.0 01/12/2017   Has not bee following diet as well since pandemic started, but is not checking blood sugars consistently.   Episodes of hypoglycemia? no    Weight trend: stable  Current diet: in general, a "healthy" diet   Current exercise: none  ------------------------------------------------------------------------   Hypertension, follow-up:  BP Readings from Last 3 Encounters:  07/15/18 113/77  11/15/17 130/72  05/14/17 110/82    He was last seen for hypertension 6 months ago.  Dose of irbesartan was doubled and he reports home BP has been much better since then, and is having no adverse effects from medication   ------------------------------------------------------------------------    Lipid/Cholesterol, Follow-up:    Last Lipid Panel:    Component Value Date/Time   CHOL 129 05/14/2017 0857   TRIG 71 05/14/2017 0857   HDL 48 05/14/2017 0857   CHOLHDL 2.7 05/14/2017 0857   LDLCALC 67 05/14/2017 0857    He reports good compliance with treatment.    Wt Readings from Last 3 Encounters:  07/15/18 298 lb (135.2 kg)  11/15/17 288 lb (130.6 kg)  04/05/17 279 lb 6.4 oz (126.7 kg)    ------------------------------------------------------------------------    Review of  Systems  Constitutional: Negative.   HENT: Negative.   Eyes: Negative.   Respiratory: Positive for cough. Negative for apnea, choking, chest tightness, shortness of breath, wheezing and stridor.   Cardiovascular: Negative.   Gastrointestinal: Negative.   Endocrine: Negative.   Genitourinary: Negative.   Musculoskeletal: Positive for neck pain. Negative for arthralgias, back pain, gait problem, joint swelling, myalgias and neck stiffness.  Skin: Negative.   Allergic/Immunologic: Negative.   Neurological: Negative.   Hematological: Negative.   Psychiatric/Behavioral: Negative.     Social History      He  reports that he has never smoked. He has never used smokeless tobacco. He reports that he does not drink alcohol or use drugs.       Social History   Socioeconomic History  . Marital status: Married    Spouse name: Not on file  . Number of children: 0  . Years of education: Not on file  . Highest education level: Not on file  Occupational History  . Occupation: [employer]: Employed  Social Needs  . Financial resource strain: Not on file  . Food insecurity:    Worry: Not on file    Inability: Not on file  . Transportation needs:    Medical: Not on file    Non-medical: Not on file  Tobacco Use  . Smoking status: Never Smoker  . Smokeless tobacco: Never Used  Substance and Sexual Activity  . Alcohol use: No    Alcohol/week: 0.0 standard drinks  . Drug use: No  . Sexual activity: Yes  Birth control/protection: None  Lifestyle  . Physical activity:    Days per week: Not on file    Minutes per session: Not on file  . Stress: Not on file  Relationships  . Social connections:    Talks on phone: Not on file    Gets together: Not on file    Attends religious service: Not on file    Active member of club or organization: Not on file    Attends meetings of clubs or organizations: Not on file    Relationship status: Not on file  Other Topics Concern   . Not on file  Social History Narrative  . Not on file    Past Medical History:  Diagnosis Date  . History of hydrocele      Patient Active Problem List   Diagnosis Date Noted  . Cervical radiculitis 01/26/2017  . Herniated cervical disc 11/27/2016  . Allergic rhinitis due to pollen 05/11/2016  . Hyperuricemia 01/12/2016  . Gout 09/15/2015  . Bradycardia 09/16/2014  . Palpitations 09/16/2014  . Panic attacks 09/16/2014  . Arthralgia of hip or thigh 03/21/2009  . Anxiety disorder 07/07/2008  . Proteinuria 10/22/2006  . Essential (primary) hypertension 06/11/2006  . Morbid obesity ([place]) 06/11/2006  . Diabetes mellitus, type 2 ([place]) 01/28/2000  . Insomnia 05/03/1999    Past Surgical History:  Procedure Laterality Date  . ANTERIOR CERVICAL DECOMP/DISCECTOMY FUSION  03/2017   Dr. Arnoldo Morale, [place]  . KNEE SURGERY  2006   left knee reconstruction after tibial fracture  . LEG SURGERY  1975   leg and foot surgery; right lower leg circa    Family History        Family Status  Relation Name Status  . Mother  Alive  . Father  Deceased at age 43       Cause of Death: Stomach and pancreatic cancer  . Sister HALF Alive  . Brother HALF Alive  . Other Gen. family hx (Not Specified)  . Neg Hx  (Not Specified)        His family history includes Alcohol abuse in his father; Anxiety disorder in an other family member; Drug abuse in his sister; Healthy in his brother; Hyperlipidemia in his mother; Lung cancer in his father and another family member; Pancreatic cancer in his father; Panic disorder in his mother; Prostate cancer in an other family member; Stomach cancer in his father. There is no history of Colon cancer or Heart disease.      Allergies  Allergen Reactions  . Doxycycline Rash  . Amlodipine Besylate     Constipation, palitations, swelling, shortness of breath  . Augmentin [Amoxicillin-Pot Clavulanate]     intolerant: Causes insomnia  . Cyclobenzaprine   .  Indomethacin Swelling    Caused Fluid retention  . Keflex  [Cephalexin]     intolerant  . Medrol [Methylprednisolone] Palpitations     Current Outpatient Medications:  .  acetaminophen (TYLENOL) 500 MG tablet, Take 500 mg by mouth every 6 (six) hours as needed., Disp: , Rfl:  .  irbesartan-hydrochlorothiazide (AVALIDE) 150-12.5 MG tablet, Take 2 tablets by mouth daily., Disp: 180 tablet, Rfl: 4 .  Probiotic Product (PROBIOTIC DAILY PO), Take by mouth., Disp: , Rfl:  .  psyllium (REGULOID) 0.52 g capsule, Take 0.52 g by mouth daily., Disp: , Rfl:  .  sertraline (ZOLOFT) 25 MG tablet, TAKE 1 TABLET BY MOUTH EVERY DAY (Patient taking differently: 12.5 mg. TAKE 1 TABLET BY MOUTH EVERY DAY), Disp: 30  tablet, Rfl: 12 .  ibuprofen (ADVIL,MOTRIN) 800 MG tablet, TAKE ONE TABLET TWICE DAILY AS NEEDED (Patient not taking: Reported on 07/15/2018), Disp: 60 tablet, Rfl: 1 .  ONE TOUCH ULTRA TEST test strip, USE UP TO 3 TIMES DAILY AS DIRECTED, Disp: 100 each, Rfl: 5 .  traZODone (DESYREL) 50 MG tablet, TAKE 2 TO 3 TABLETS BY MOUTH AT BEDTIME (Patient not taking: Reported on 07/15/2018), Disp: 60 tablet, Rfl: 5   Patient Care Team: Birdie Sons, MD as PCP - General (Family Medicine) Elvina Mattes, Rodman Key, DPM as Attending Physician (Podiatry) Anell Barr, OD (Optometry)    Objective:    Vitals: BP 113/77 (BP Location: Right Arm, Patient Position: Sitting, Cuff Size: Large)   Pulse 72   Temp 98.9 F (37.2 C) (Oral)   Ht 6' (1.829 m)   Wt 298 lb (135.2 kg)   BMI 40.42 kg/m    Vitals:   07/15/18 0911  BP: 113/77  Pulse: 72  Temp: 98.9 F (37.2 C)  TempSrc: Oral  Weight: 298 lb (135.2 kg)  Height: 6' (1.829 m)     Physical Exam   General Appearance:    Alert, cooperative, no distress, appears stated age,morbidly obese  Head:    Normocephalic, without obvious abnormality, atraumatic  Eyes:    PERRL, conjunctiva/corneas clear, EOM's intact, fundi    benign, both eyes       Ears:     Normal TM's and external ear canals, both ears  Nose:   Nares normal, septum midline, mucosa normal, no drainage   or sinus tenderness  Throat:   Lips, mucosa, and tongue normal; teeth and gums normal  Neck:   Supple, symmetrical, trachea midline, no adenopathy;       thyroid:  No enlargement/tenderness/nodules; no carotid   bruit or JVD  Back:     Symmetric, no curvature, ROM normal, no CVA tenderness  Lungs:     Clear to auscultation bilaterally, respirations unlabored  Chest wall:    No tenderness or deformity  Heart:    Regular rate and rhythm, S1 and S2 normal, no murmur, rub   or gallop  Abdomen:     Soft, non-tender, bowel sounds active all four quadrants,    no masses, no organomegaly  Genitalia:    deferred  Rectal:    deferred  Extremities:   Extremities normal, atraumatic, no cyanosis or edema  Pulses:   2+ and symmetric all extremities  Skin:   Skin color, texture, turgor normal, no rashes or lesions  Lymph nodes:   Cervical, supraclavicular, and axillary nodes normal  Neurologic:   CNII-XII intact. Normal strength, sensation and reflexes      throughout    Depression Screen PHQ 2/9 Scores 07/15/2018 04/05/2017 03/30/2016 12/01/2014  PHQ - 2 Score 1 1 0 1  PHQ- 9 Score _0 Assessment & Plan:     Routine Health Maintenance and Physical Exam  Exercise Activities and Dietary recommendations Goals   None     Immunization History  Administered Date(s) Administered  . Influenza-Unspecified 11/28/2014, 12/11/2016  . MMR 02/21/2017, 03/23/2017  . PPD Test 02/26/2017  . Pneumococcal Polysaccharide-23 08/05/2008, 01/08/2013  . Td 11/18/1998, 11/15/2017  . Tdap 10/02/2007    Health Maintenance  Topic Date Due  . FOOT EXAM  12/06/1980  . HIV Screening  12/06/1985  . OPHTHALMOLOGY EXAM  05/02/2018  . HEMOGLOBIN A1C  05/16/2018  . INFLUENZA VACCINE  09/28/2018  .  TETANUS/TDAP  11/16/2027  . PNEUMOCOCCAL POLYSACCHARIDE VACCINE AGE 17-64 HIGH RISK   Completed     Discussed health benefits of physical activity, and encouraged him to engage in regular exercise appropriate for his age and condition.    --------------------------------------------------------------------  1. Annual physical exam Currently diet controlled.  - Comprehensive metabolic panel - Lipid panel - Hemoglobin A1c  2. Type 2 diabetes mellitus without complication, without long-term current use of insulin ([place]) Counseled on availability of hepatitis a and b vaccines. Diabetes currently diet controlled.  - Comprehensive metabolic panel - Lipid panel - Hemoglobin A1c  3. Essential (primary) hypertension Well controlled.  Continue current medications.    4. Obesity, morbid ([place]) Counseled regarding prudent diet and regular exercise.    Lelon Huh, MD  [facility]

## 2018-07-15 NOTE — Patient Instructions (Addendum)
.   Please review the attached list of medications and notify my office if there are any errors.   . Please bring all of your medications to every appointment so we can make sure that our medication list is the same as yours.    You should consider getting vaccine for Hepatitis A and Hepatitis B. You can get these at the [facility] Department our you can make an appointment to start the series at [facility].

## 2018-07-16 LAB — COMPREHENSIVE METABOLIC PANEL
ALT: 29 IU/L (ref 0–44)
AST: 23 IU/L (ref 0–40)
Albumin/Globulin Ratio: 2.2 (ref 1.2–2.2)
Albumin: 4.8 g/dL (ref 4.0–5.0)
Alkaline Phosphatase: 50 IU/L (ref 39–117)
BUN/Creatinine Ratio: 11 (ref 9–20)
BUN: 12 mg/dL (ref 6–24)
Bilirubin Total: 1 mg/dL (ref 0.0–1.2)
CO2: 24 mmol/L (ref 20–29)
Calcium: 9.3 mg/dL (ref 8.7–10.2)
Chloride: 100 mmol/L (ref 96–106)
Creatinine, Ser: 1.05 mg/dL (ref 0.76–1.27)
GFR calc Af Amer: 97 mL/min/{1.73_m2} (ref >59)
GFR calc non Af Amer: 84 mL/min/{1.73_m2} (ref >59)
Globulin, Total: 2.2 g/dL (ref 1.5–4.5)
Glucose: 111 mg/dL — ABNORMAL HIGH (ref 65–99)
Potassium: 4 mmol/L (ref 3.5–5.2)
Sodium: 140 mmol/L (ref 134–144)
Total Protein: 7 g/dL (ref 6.0–8.5)

## 2018-07-16 LAB — LIPID PANEL
Chol/HDL Ratio: 2.7 ratio (ref 0.0–5.0)
Cholesterol, Total: 159 mg/dL (ref 100–199)
HDL: 58 mg/dL (ref >39)
LDL Calculated: 87 mg/dL (ref 0–99)
Triglycerides: 70 mg/dL (ref 0–149)
VLDL Cholesterol Cal: 14 mg/dL (ref 5–40)

## 2018-07-16 LAB — HEMOGLOBIN A1C
Est. average glucose Bld gHb Est-mCnc: 111 mg/dL
Hgb A1c MFr Bld: 5.5 % (ref 4.8–5.6)

## 2018-08-30 ENCOUNTER — Encounter: Payer: Self-pay | Admitting: Family Medicine

## 2018-09-02 MED ORDER — IBUPROFEN 800 MG PO TABS: ORAL_TABLET | ORAL | 1 refills | Status: DC

## 2018-10-09 ENCOUNTER — Other Ambulatory Visit: Payer: Self-pay | Admitting: Family Medicine

## 2018-11-08 ENCOUNTER — Other Ambulatory Visit: Payer: Self-pay | Admitting: Physician Assistant

## 2019-01-03 ENCOUNTER — Telehealth: Payer: Self-pay

## 2019-01-03 NOTE — Telephone Encounter (Signed)
FYI..  Pt needs his handicapped placard renewed.  He states his wife is going to drop off the form on Monday.

## 2019-01-06 NOTE — Telephone Encounter (Signed)
Pt's wife dropped off placard application. Form was placed in Dr. Maralyn Sago box for completion. Thanks TNP

## 2019-03-10 ENCOUNTER — Ambulatory Visit
Admission: EM | Admit: 2019-03-10 | Discharge: 2019-03-10 | Disposition: A | Discharge status: Home / Self Care | Payer: Managed Care, Other (non HMO) | Attending: Emergency Medicine | Admitting: Emergency Medicine

## 2019-03-10 ENCOUNTER — Other Ambulatory Visit: Payer: Self-pay

## 2019-03-10 DIAGNOSIS — N3001 Acute cystitis with hematuria: Secondary | ICD-10-CM | POA: Diagnosis present

## 2019-03-10 LAB — URINALYSIS, COMPLETE (UACMP) WITH MICROSCOPIC
Bilirubin Urine: NEGATIVE
Glucose, UA: NEGATIVE mg/dL
Ketones, ur: NEGATIVE mg/dL
Nitrite: POSITIVE — AB
Protein, ur: 100 mg/dL — AB
Specific Gravity, Urine: 1.025 (ref 1.005–1.030)
pH: 6 (ref 5.0–8.0)

## 2019-03-10 MED ORDER — PHENAZOPYRIDINE HCL 200 MG PO TABS: 200.0000 mg | ORAL_TABLET | Freq: Three times a day (TID) | ORAL | 0 refills | Status: DC | PRN

## 2019-03-10 MED ORDER — CEFDINIR 300 MG PO CAPS: 300.0000 mg | ORAL_CAPSULE | Freq: Two times a day (BID) | ORAL | 0 refills | Status: AC

## 2019-03-10 NOTE — Discharge Instructions (Addendum)
Push plenty of extra fluids.  The Pyridium will turn your urine orange, but will help your symptoms.  The Omnicef will take care of the infection.

## 2019-03-10 NOTE — ED Triage Notes (Signed)
Pt presents with c/o urinary frequency and urgency that started yesterday. He also reports fever (100.7) last night and a headache. He took Azo last night and some Tylenol. Prior to the Azo he did not notice any hematuria. He also denies any abdominal pain or back pain. He does have some low abdominal pressure.

## 2019-03-10 NOTE — ED Provider Notes (Signed)
HPI  SUBJECTIVE:  [name] is a 49 y.o. male who presents with urinary urgency, frequency, odorous urine.  He reports suprapubic pressure before urinating.  He has no difficulty initiating his stream.  No dysuria, cloudy urine,..  Reports fevers of 100.7 last night.  No other abdominal, back pain.  No swelling or erythema of the glans, penile itching, penile rash, discharge, testicular pain or swelling, perineal pain.  Is a long-term monogamous relationship with his wife who is asymptomatic.  STDs are not a concern today.  He tried Azo once and Tylenol.  The Azo helps.  Symptoms are better with urinating and worse with walking.  No antibiotics in the past month.  No antipyretic in the past 4 to 6 hours.  No recent change in medications.  He has a past medical history of UTIs, proteinuria diabetes, hypertension, balanitis hydrocele removal.  No history of pyelonephritis, nephrolithiasis, epididymitis, orchitis, prostatitis, gonorrhea, chlamydia, HIV, HSV, trichomonas, syphilis.  [id number], Demetrios Isaacs, MD   Past Medical History:  Diagnosis Date  . History of hydrocele     Past Surgical History:  Procedure Laterality Date  . ANTERIOR CERVICAL DECOMP/DISCECTOMY FUSION  03/2017   Dr. Lovell Sheehan, [facility]  . KNEE SURGERY  2006   left knee reconstruction after tibial fracture  . LEG SURGERY  1975   leg and foot surgery; right lower leg circa    Family History  Problem Relation Age of Onset  . Hyperlipidemia Mother   . Panic disorder Mother   . Alcohol abuse Father   . Pancreatic cancer Father   . Stomach cancer Father   . Lung cancer Father   . Drug abuse Sister   . Healthy Brother   . Prostate cancer Other   . Lung cancer Other   . Anxiety disorder Other   . Colon cancer Neg Hx   . Heart disease Neg Hx     Social History   Tobacco Use  . Smoking status: Never Smoker  . Smokeless tobacco: Never Used  Substance Use Topics  . Alcohol use: No    Alcohol/week: 0.0 standard drinks  .  Drug use: No    No current facility-administered medications for this encounter.  Current Outpatient Medications:  .  acetaminophen (TYLENOL) 500 MG tablet, Take 500 mg by mouth every 6 (six) hours as needed., Disp: , Rfl:  .  ibuprofen (ADVIL) 800 MG tablet, TAKE ONE TABLET TWICE DAILY AS NEEDED, Disp: 60 tablet, Rfl: 1 .  irbesartan-hydrochlorothiazide (AVALIDE) 150-12.5 MG tablet, Take 2 tablets by mouth daily., Disp: 180 tablet, Rfl: 4 .  ONE TOUCH ULTRA TEST test strip, USE UP TO 3 TIMES DAILY AS DIRECTED, Disp: 100 each, Rfl: 5 .  Probiotic Product (PROBIOTIC DAILY PO), Take by mouth., Disp: , Rfl:  .  psyllium (REGULOID) 0.52 g capsule, Take 0.52 g by mouth daily., Disp: , Rfl:  .  sertraline (ZOLOFT) 25 MG tablet, TAKE 1 TABLET BY MOUTH EVERY DAY, Disp: 30 tablet, Rfl: 12 .  cefdinir (OMNICEF) 300 MG capsule, Take 1 capsule (300 mg total) by mouth 2 (two) times daily for 7 days., Disp: 20 capsule, Rfl: 0 .  phenazopyridine (PYRIDIUM) 200 MG tablet, Take 1 tablet (200 mg total) by mouth 3 (three) times daily as needed for pain., Disp: 6 tablet, Rfl: 0  Allergies  Allergen Reactions  . Doxycycline Rash  . Amlodipine Besylate     Constipation, palitations, swelling, shortness of breath  . Augmentin [Amoxicillin-Pot Clavulanate]  intolerant: Causes insomnia  . Cyclobenzaprine   . Indomethacin Swelling    Caused Fluid retention  . Keflex  [Cephalexin]     intolerant  . Medrol [Methylprednisolone] Palpitations     ROS  As noted in HPI.   Physical Exam  BP (!) 146/86 (BP Location: Left Arm)   Pulse 62   Temp 98.9 F (37.2 C) (Oral)   Ht 6' (1.829 m)   Wt (!) 146.5 kg   SpO2 99%   BMI 43.81 kg/m   Constitutional: Well developed, well nourished, no acute distress Eyes:  EOMI, conjunctiva normal bilaterally HENT: Normocephalic, atraumatic,mucus membranes moist Respiratory: Normal inspiratory effort Cardiovascular: Normal rate GI: nondistended obese.  Positive  suprapubic tenderness.  No flank tenderness. Back: No CVAT GU: Uncircumcised male, no evidence of balanitis.  No discharge.  Testes nontender, descended bilaterally.  No epididymal tenderness.  No scrotal erythema, edema.  Patient declined chaperone. Rectal: Firm normal-sized nontender prostate. skin: No rash, skin intact Musculoskeletal: no deformities Neurologic: Alert & oriented x 3, no focal neuro deficits Psychiatric: Speech and behavior appropriate   ED Course   Medications - No data to display  Orders Placed This Encounter  Procedures  . Urine culture    Standing Status:   Standing    Number of Occurrences:   1  . Urinalysis, Complete w Microscopic    Standing Status:   Standing    Number of Occurrences:   1    Results for orders placed or performed during the hospital encounter of 03/10/19 (from the past 24 hour(s))  Urinalysis, Complete w Microscopic     Status: Abnormal   Collection Time: 03/10/19  2:50 PM  Result Value Ref Range   Color, Urine YELLOW YELLOW   APPearance CLOUDY (A) CLEAR   Specific Gravity, Urine 1.025 1.005 - 1.030   pH 6.0 5.0 - 8.0   Glucose, UA NEGATIVE NEGATIVE mg/dL   Hgb urine dipstick MODERATE (A) NEGATIVE   Bilirubin Urine NEGATIVE NEGATIVE   Ketones, ur NEGATIVE NEGATIVE mg/dL   Protein, ur 338 (A) NEGATIVE mg/dL   Nitrite POSITIVE (A) NEGATIVE   Leukocytes,Ua SMALL (A) NEGATIVE   Squamous Epithelial / LPF 0-5 0 - 5   WBC, UA 21-50 0 - 5 WBC/hpf   RBC / HPF 11-20 0 - 5 RBC/hpf   Bacteria, UA MANY (A) NONE SEEN   No results found.  ED Clinical Impression  1. Acute cystitis with hematuria      ED Assessment/Plan  UA with moderate hematuria, positive nitrites, leukocytes and many bacteria consistent with a UTI.  Will send off for culture.  No home with Pyridium, Omnicef due to complicated UTI.  Patient states that Keflex gives him insomnia, denies anaphylaxis.  Is willing to try the [facility].  Follow-up with PMD as needed, to  the ER if he gets worse.  Discussed labs, MDM, treatment plan, and plan for follow-up with patient. Discussed sn/sx that should prompt return to the ED. patient agrees with plan.   Meds ordered this encounter  Medications  . cefdinir (OMNICEF) 300 MG capsule    Sig: Take 1 capsule (300 mg total) by mouth 2 (two) times daily for 7 days.    Dispense:  20 capsule    Refill:  0  . phenazopyridine (PYRIDIUM) 200 MG tablet    Sig: Take 1 tablet (200 mg total) by mouth 3 (three) times daily as needed for pain.    Dispense:  6 tablet    Refill:  0    *This clinic note was created using Lobbyist. Therefore, there may be occasional mistakes despite careful proofreading.   ?    Melynda Ripple, MD 03/11/19 1027

## 2019-03-12 LAB — URINE CULTURE: Culture: >=100000 — AB

## 2019-04-01 ENCOUNTER — Other Ambulatory Visit: Payer: Self-pay | Admitting: Family Medicine

## 2019-04-19 ENCOUNTER — Other Ambulatory Visit: Payer: Self-pay | Admitting: Family Medicine

## 2019-04-19 NOTE — Telephone Encounter (Signed)
Requested Prescriptions  Pending Prescriptions Disp Refills  . irbesartan-hydrochlorothiazide (AVALIDE) 150-12.5 MG tablet [Pharmacy Med Name: IRBESARTAN/HCTZ 150-12.5MG  TABLETS] 90 tablet 0    Sig: TAKE 1 TABLET BY MOUTH DAILY     Cardiovascular: ARB + Diuretic Combos Failed - 04/19/2019  2:03 PM      Failed - K in normal range and within 180 days    Potassium  Date Value Ref Range Status  07/15/2018 4.0 3.5 - 5.2 mmol/L Final  12/09/2013 3.6 3.5 - 5.1 mmol/L Final         Failed - Na in normal range and within 180 days    Sodium  Date Value Ref Range Status  07/15/2018 140 134 - 144 mmol/L Final  12/09/2013 141 136 - 145 mmol/L Final         Failed - Cr in normal range and within 180 days    Creatinine  Date Value Ref Range Status  12/09/2013 1.07 0.60 - 1.30 mg/dL Final   Creatinine, Ser  Date Value Ref Range Status  07/15/2018 1.05 0.76 - 1.27 mg/dL Final   Creatinine, POC  Date Value Ref Range Status  09/15/2015 n/a mg/dL Final         Failed - Ca in normal range and within 180 days    Calcium  Date Value Ref Range Status  07/15/2018 9.3 8.7 - 10.2 mg/dL Final   Calcium, Total  Date Value Ref Range Status  12/09/2013 8.1 (L) 8.5 - 10.1 mg/dL Final         Failed - Last BP in normal range    BP Readings from Last 1 Encounters:  03/10/19 (!) 146/86         Failed - Valid encounter within last 6 months    Recent Outpatient Visits          9 months ago Annual physical exam   [facility] Malva Limes, MD   1 year ago Type 2 diabetes mellitus without complication, without long-term current use of insulin (HCC)   [facility] Malva Limes, MD   1 year ago Type 2 diabetes mellitus without complication, without long-term current use of insulin [facility])   [facility] Malva Limes, MD   2 years ago Annual physical exam   [facility] Malva Limes, MD   2 years ago Type 2 diabetes mellitus  without complication, without long-term current use of insulin [facility])   [facility] Malva Limes, MD             Passed - Patient is not pregnant

## 2019-05-18 ENCOUNTER — Encounter: Payer: Self-pay | Admitting: Family Medicine

## 2019-05-19 MED ORDER — IRBESARTAN-HYDROCHLOROTHIAZIDE 300-12.5 MG PO TABS: 1.0000 | ORAL_TABLET | Freq: Every day | ORAL | 3 refills | Status: DC

## 2019-07-17 NOTE — Progress Notes (Signed)
Trena Platt Cummings,acting as a scribe for Lelon Huh, MD.,have documented all relevant documentation on the behalf of Lelon Huh, MD,as directed by  Lelon Huh, MD while in the presence of Lelon Huh, MD.  Complete physical exam   Patient: [name]   DOB: 02/04/1971   49 y.o. Male  MRN: [id number] Visit Date: 07/18/2019  Today's healthcare provider: Lelon Huh, MD   Chief Complaint  Patient presents with  . Annual Exam  . Diabetes Mellitus  . Hypertension   Subjective    [name] is a 50 y.o. male who presents today for a complete physical exam.  He reports consuming a general diet. Gym/ health club routine includes treadmill. He generally feels well. He reports sleeping well. He does not have additional problems to discuss today, although he has had considerably more stress over the last year related to Covid restriction, working full time with a side job at [employer] and working on his dissertation for his phD. He has also been having more trouble with congenital foot deformity and foot brace for which he has previously been followed by Dr. Elvina Mattes. He did start exercising again a few months ago and starting to lose some of his Covid weight.  He has gone up to 1 full tablet of sertraline every day. Has also had some allergy sx which are fairly well managed by OTC medications.  HPI  Diabetes Mellitus Type II, follow-up  Lab Results  Component Value Date   HGBA1C 5.5 07/15/2018   HGBA1C 4.9 11/15/2017   HGBA1C 5.1 05/14/2017   Last seen for diabetes 1 years ago.  Management since then includes continuing the same treatment. He reports fair compliance with treatment. He is not having side effects.   Home blood sugar records: not checking  Episodes of hypoglycemia? No    Current insulin regiment: None Most Recent Eye Exam: 05/01/2017  --------------------------------------------------------------------------------------------------- Hypertension,  follow-up  BP Readings from Last 3 Encounters:  03/10/19 (!) 146/86  07/15/18 113/77  11/15/17 130/72   Wt Readings from Last 3 Encounters:  03/10/19 (!) 323 lb (146.5 kg)  07/15/18 298 lb (135.2 kg)  11/15/17 288 lb (130.6 kg)     He was last seen for hypertension 1 years ago.  BP at that visit was 113/77. Management since that visit includes no change. He reports excellent compliance with treatment. He is not having side effects.  He is exercising. He is adherent to low salt diet.   Outside blood pressures are being checked.  He does not smoke.  Use of agents associated with hypertension: NSAIDS.   ---------------------------------------------------------------------------------------------------  Past Medical History:  Diagnosis Date  . History of hydrocele    Past Surgical History:  Procedure Laterality Date  . ANTERIOR CERVICAL DECOMP/DISCECTOMY FUSION  03/2017   Dr. Arnoldo Morale, [place]  . KNEE SURGERY  2006   left knee reconstruction after tibial fracture  . LEG SURGERY  1975   leg and foot surgery; right lower leg circa   Social History   Socioeconomic History  . Marital status: Married    Spouse name: Not on file  . Number of children: 0  . Years of education: Not on file  . Highest education level: Not on file  Occupational History  . Occupation: [employer]    Comment: Employed  Tobacco Use  . Smoking status: Never Smoker  . Smokeless tobacco: Never Used  Substance and Sexual Activity  . Alcohol use: No  Alcohol/week: 0.0 standard drinks  . Drug use: No  . Sexual activity: Yes    Birth control/protection: None  Other Topics Concern  . Not on file  Social History Narrative  . Not on file   Social Determinants of Health   Financial Resource Strain:   . Difficulty of Paying Living Expenses:   Food Insecurity:   . Worried About Charity fundraiser in the Last Year:   . Arboriculturist in the Last Year:   Transportation Needs:   . Consulting civil engineer (Medical):   Marland Kitchen Lack of Transportation (Non-Medical):   Physical Activity:   . Days of Exercise per Week:   . Minutes of Exercise per Session:   Stress:   . Feeling of Stress :   Social Connections:   . Frequency of Communication with Friends and Family:   . Frequency of Social Gatherings with Friends and Family:   . Attends Religious Services:   . Active Member of Clubs or Organizations:   . Attends Archivist Meetings:   Marland Kitchen Marital Status:   Intimate Partner Violence:   . Fear of Current or Ex-Partner:   . Emotionally Abused:   Marland Kitchen Physically Abused:   . Sexually Abused:    Family Status  Relation Name Status  . Mother  Alive  . Father  Deceased at age 81       Cause of Death: Stomach and pancreatic cancer  . Sister HALF Alive  . Brother HALF Alive  . Other Gen. family hx (Not Specified)  . Neg Hx  (Not Specified)   Family History  Problem Relation Age of Onset  . Hyperlipidemia Mother   . Panic disorder Mother   . Alcohol abuse Father   . Pancreatic cancer Father   . Stomach cancer Father   . Lung cancer Father   . Drug abuse Sister   . Healthy Brother   . Prostate cancer Other   . Lung cancer Other   . Anxiety disorder Other   . Colon cancer Neg Hx   . Heart disease Neg Hx    Allergies  Allergen Reactions  . Doxycycline Rash  . Amlodipine Besylate     Constipation, palitations, swelling, shortness of breath  . Augmentin [Amoxicillin-Pot Clavulanate]     intolerant: Causes insomnia  . Cyclobenzaprine   . Indomethacin Swelling    Caused Fluid retention  . Keflex  [Cephalexin]     intolerant  . Medrol [Methylprednisolone] Palpitations    Patient Care Team: Birdie Sons, MD as PCP - General (Family Medicine) Elvina Mattes, Adele Schilder as Attending Physician (Podiatry) Anell Barr, OD (Optometry)   Medications: Outpatient Medications Prior to Visit  Medication Sig  . acetaminophen (TYLENOL) 500 MG tablet Take 500 mg by  mouth every 6 (six) hours as needed.  Marland Kitchen ibuprofen (ADVIL) 800 MG tablet TAKE ONE TABLET TWICE DAILY AS NEEDED  . irbesartan-hydrochlorothiazide (AVALIDE) 300-12.5 MG tablet Take 1 tablet by mouth daily.  . ONE TOUCH ULTRA TEST test strip USE UP TO 3 TIMES DAILY AS DIRECTED  . phenazopyridine (PYRIDIUM) 200 MG tablet Take 1 tablet (200 mg total) by mouth 3 (three) times daily as needed for pain.  . Probiotic Product (PROBIOTIC DAILY PO) Take by mouth.  . psyllium (REGULOID) 0.52 g capsule Take 0.52 g by mouth daily.  . sertraline (ZOLOFT) 25 MG tablet TAKE 1 TABLET BY MOUTH EVERY DAY   No facility-administered medications prior to visit.  Review of Systems  Constitutional: Negative.   HENT: Positive for congestion, rhinorrhea, sneezing and tinnitus.   Eyes: Positive for photophobia.  Respiratory: Positive for cough.   Cardiovascular: Negative.   Gastrointestinal: Positive for constipation.  Endocrine: Negative.   Genitourinary: Negative.   Musculoskeletal: Positive for gait problem, myalgias, neck pain and neck stiffness.  Skin: Negative.   Allergic/Immunologic: Positive for environmental allergies.  Neurological: Positive for headaches.  Hematological: Negative.   Psychiatric/Behavioral: Negative.       Objective    BP (!) 151/91   Pulse (!) 55   Temp (!) 96.9 F (36.1 C) (Temporal)   Wt (!) 336 lb (152.4 kg)   BMI 45.57 kg/m    Physical Exam   General Appearance:    Obese male. Alert, cooperative, in no acute distress, appears stated age  Head:    Normocephalic, without obvious abnormality, atraumatic  Eyes:    PERRL, conjunctiva/corneas clear, EOM's intact, fundi    benign, both eyes       Ears:    Normal TM's and external ear canals, both ears  Neck:   Supple, symmetrical, trachea midline, no adenopathy;       thyroid:  No enlargement/tenderness/nodules; no carotid   bruit or JVD  Lungs:     Clear to auscultation bilaterally, respirations unlabored  Chest  wall:    No tenderness or deformity  Heart:    Bradycardic. Normal rhythm. No murmurs, rubs, or gallops.  S1 and S2 normal  Genitalia:    deferred  Rectal:    deferred  Extremities:   All extremities are intact. No cyanosis or unusual edema  Skin:   Skin color, texture, turgor normal, no rashes or lesions     Depression Screen  PHQ 2/9 Scores 07/15/2018 04/05/2017 03/30/2016  PHQ - 2 Score 1 1 0  PHQ- 9 Score _0 No results found for any visits on 07/18/19.  Assessment & Plan    Routine Health Maintenance and Physical Exam  Exercise Activities and Dietary recommendations Goals   None     Immunization History  Administered Date(s) Administered  . Influenza-Unspecified 11/28/2014, 12/11/2016  . MMR 02/21/2017, 03/23/2017  . PPD Test 02/26/2017  . Pneumococcal Polysaccharide-23 08/05/2008, 01/08/2013  . Td 11/18/1998, 11/15/2017  . Tdap 10/02/2007    Health Maintenance  Topic Date Due  . FOOT EXAM  Never done  . COVID-19 Vaccine (1) Never done  . HIV Screening  Never done  . OPHTHALMOLOGY EXAM  05/02/2018  . HEMOGLOBIN A1C  01/15/2019  . INFLUENZA VACCINE  09/28/2019  . TETANUS/TDAP  11/16/2027  . PNEUMOCOCCAL POLYSACCHARIDE VACCINE AGE 41-64 HIGH RISK  Completed    Discussed health benefits of physical activity, and encouraged him to engage in regular exercise appropriate for his age and condition.  1. Annual physical exam   2. Type 2 diabetes mellitus without complication, without long-term current use of insulin (HCC) Has not been checking A1c, but expect it to be up. He has started back on exercise regiment and losing some weight that was put on earlier in the Covid pandemic.  - HgB A1c  3. Essential (primary) hypertension He reports home systolic blood pressures in the 130 which are a little above target. He has been under more stress and expect this to improve if he continues to lose weight. Anticipated scheduling follow up in the fall after reviewing lab  results.   - Lipid Profile - CBC with Differential - TSH - Comprehensive  Metabolic Panel (CMET)  4. Obesity, morbid ([place]) Had lost considerable weight before Covid. Is not getting back to his pre-Covid diet and exercise regiment.   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 Anxiety/panic disorder Has gone up from 1/2 x 73m to 1 full 210mtablet of sertraline a day, but he reports that his stress level has been elevated for several months. Counseled that this is still on the low end of the dosing range and that 50-10026ms more typical. He prefers to continue the 25, but advised he could double dose if he needs to.   No follow-ups on file.     The entirety of the information documented in the History of Present Illness, Review of Systems and Physical Exam were personally obtained by me. Portions of this information were initially documented by the CMA and reviewed by me for thoroughness and accuracy.      DonLelon HuhD  [phone]) [phone])  [place]

## 2019-07-18 ENCOUNTER — Ambulatory Visit (INDEPENDENT_AMBULATORY_CARE_PROVIDER_SITE_OTHER): Payer: Managed Care, Other (non HMO) | Admitting: Family Medicine

## 2019-07-18 ENCOUNTER — Encounter: Payer: Self-pay | Admitting: Family Medicine

## 2019-07-18 ENCOUNTER — Other Ambulatory Visit: Payer: Self-pay

## 2019-07-18 VITALS — BP 151/91 | HR 55 | Temp 96.9°F | Wt 336.0 lb

## 2019-07-18 DIAGNOSIS — F413 Other mixed anxiety disorders: Secondary | ICD-10-CM

## 2019-07-18 DIAGNOSIS — E119 Type 2 diabetes mellitus without complications: Secondary | ICD-10-CM | POA: Diagnosis not present

## 2019-07-18 DIAGNOSIS — I10 Essential (primary) hypertension: Secondary | ICD-10-CM | POA: Diagnosis not present

## 2019-07-18 DIAGNOSIS — Q6641 Congenital talipes calcaneovalgus, right foot: Secondary | ICD-10-CM

## 2019-07-18 DIAGNOSIS — Z0000 Encounter for general adult medical examination without abnormal findings: Secondary | ICD-10-CM

## 2019-07-19 LAB — COMPREHENSIVE METABOLIC PANEL
ALT: 22 IU/L (ref 0–44)
AST: 20 IU/L (ref 0–40)
Albumin/Globulin Ratio: 1.7 (ref 1.2–2.2)
Albumin: 4.5 g/dL (ref 4.0–5.0)
Alkaline Phosphatase: 59 IU/L (ref 48–121)
BUN/Creatinine Ratio: 18 (ref 9–20)
BUN: 18 mg/dL (ref 6–24)
Bilirubin Total: 1.1 mg/dL (ref 0.0–1.2)
CO2: 24 mmol/L (ref 20–29)
Calcium: 9.5 mg/dL (ref 8.7–10.2)
Chloride: 104 mmol/L (ref 96–106)
Creatinine, Ser: 0.99 mg/dL (ref 0.76–1.27)
GFR calc Af Amer: 104 mL/min/{1.73_m2} (ref >59)
GFR calc non Af Amer: 90 mL/min/{1.73_m2} (ref >59)
Globulin, Total: 2.7 g/dL (ref 1.5–4.5)
Glucose: 114 mg/dL — ABNORMAL HIGH (ref 65–99)
Potassium: 4.2 mmol/L (ref 3.5–5.2)
Sodium: 142 mmol/L (ref 134–144)
Total Protein: 7.2 g/dL (ref 6.0–8.5)

## 2019-07-19 LAB — HEMOGLOBIN A1C
Est. average glucose Bld gHb Est-mCnc: 105 mg/dL
Hgb A1c MFr Bld: 5.3 % (ref 4.8–5.6)

## 2019-07-19 LAB — CBC WITH DIFFERENTIAL/PLATELET
Basophils Absolute: 0.1 10*3/uL (ref 0.0–0.2)
Basos: 1 %
EOS (ABSOLUTE): 0.3 10*3/uL (ref 0.0–0.4)
Eos: 4 %
Hematocrit: 43.4 % (ref 37.5–51.0)
Hemoglobin: 14.6 g/dL (ref 13.0–17.7)
Immature Grans (Abs): 0 10*3/uL (ref 0.0–0.1)
Immature Granulocytes: 0 %
Lymphocytes Absolute: 1.8 10*3/uL (ref 0.7–3.1)
Lymphs: 26 %
MCH: 30.7 pg (ref 26.6–33.0)
MCHC: 33.6 g/dL (ref 31.5–35.7)
MCV: 91 fL (ref 79–97)
Monocytes Absolute: 0.4 10*3/uL (ref 0.1–0.9)
Monocytes: 6 %
Neutrophils Absolute: 4.6 10*3/uL (ref 1.4–7.0)
Neutrophils: 63 %
Platelets: 219 10*3/uL (ref 150–450)
RBC: 4.75 x10E6/uL (ref 4.14–5.80)
RDW: 12.6 % (ref 11.6–15.4)
WBC: 7.1 10*3/uL (ref 3.4–10.8)

## 2019-07-19 LAB — LIPID PANEL
Chol/HDL Ratio: 2.5 ratio (ref 0.0–5.0)
Cholesterol, Total: 126 mg/dL (ref 100–199)
HDL: 50 mg/dL (ref >39)
LDL Chol Calc (NIH): 63 mg/dL (ref 0–99)
Triglycerides: 62 mg/dL (ref 0–149)
VLDL Cholesterol Cal: 13 mg/dL (ref 5–40)

## 2019-07-19 LAB — TSH: TSH: 3.12 u[IU]/mL (ref 0.450–4.500)

## 2019-08-06 ENCOUNTER — Encounter: Payer: Managed Care, Other (non HMO) | Admitting: Family Medicine

## 2019-08-27 ENCOUNTER — Other Ambulatory Visit: Payer: Self-pay | Admitting: Family Medicine

## 2019-08-27 NOTE — Telephone Encounter (Signed)
Requested Prescriptions  Pending Prescriptions Disp Refills  . ibuprofen (ADVIL) 800 MG tablet [Pharmacy Med Name: IBUPROFEN 800MG  TABLETS] 60 tablet 1    Sig: TAKE 1 TABLET BY MOUTH TWICE DAILY AS NEEDED     Analgesics:  NSAIDS Passed - 08/27/2019 11:23 AM      Passed - Cr in normal range and within 360 days    Creatinine  Date Value Ref Range Status  12/09/2013 1.07 0.60 - 1.30 mg/dL Final   Creatinine, Ser  Date Value Ref Range Status  07/18/2019 0.99 0.76 - 1.27 mg/dL Final   Creatinine, POC  Date Value Ref Range Status  09/15/2015 n/a mg/dL Final         Passed - HGB in normal range and within 360 days    Hemoglobin  Date Value Ref Range Status  07/18/2019 14.6 13.0 - 17.7 g/dL Final         Passed - Patient is not pregnant      Passed - Valid encounter within last 12 months    Recent Outpatient Visits          1 month ago Annual physical exam   [facility], [place]   1 year ago Annual physical exam   [facility], [place]   1 year ago Type 2 diabetes mellitus without complication, without long-term current use of insulin [facility])   [facility], [place]   2 years ago Type 2 diabetes mellitus without complication, without long-term current use of insulin [facility])   [facility], [place]   2 years ago Annual physical exam   [facility], [place]

## 2019-11-25 ENCOUNTER — Other Ambulatory Visit: Payer: Self-pay | Admitting: Family Medicine

## 2019-11-25 NOTE — Telephone Encounter (Signed)
Requested Prescriptions  Pending Prescriptions Disp Refills  . sertraline (ZOLOFT) 25 MG tablet [Pharmacy Med Name: SERTRALINE 25MG  TABLETS] 90 tablet 0    Sig: TAKE 1 TABLET BY MOUTH EVERY DAY     Psychiatry:  Antidepressants - SSRI Passed - 11/25/2019 10:14 AM      Passed - Valid encounter within last 6 months    Recent Outpatient Visits          4 months ago Annual physical exam   [facility], [place]   1 year ago Annual physical exam   [facility], [place]   2 years ago Type 2 diabetes mellitus without complication, without long-term current use of insulin [facility])   [facility], MD   2 years ago Type 2 diabetes mellitus without complication, without long-term current use of insulin [facility])   [facility], MD   2 years ago Annual physical exam   [facility], MD

## 2019-11-30 ENCOUNTER — Encounter: Payer: Self-pay | Admitting: Family Medicine

## 2019-11-30 DIAGNOSIS — I10 Essential (primary) hypertension: Secondary | ICD-10-CM

## 2019-12-02 MED ORDER — HYDROCHLOROTHIAZIDE 12.5 MG PO CAPS: 12.5000 mg | ORAL_CAPSULE | Freq: Every day | ORAL | 1 refills | Status: DC

## 2020-01-23 ENCOUNTER — Other Ambulatory Visit: Payer: Self-pay | Admitting: Family Medicine

## 2020-01-23 DIAGNOSIS — I10 Essential (primary) hypertension: Secondary | ICD-10-CM

## 2020-02-25 ENCOUNTER — Other Ambulatory Visit: Payer: Self-pay | Admitting: Family Medicine

## 2020-02-25 DIAGNOSIS — I10 Essential (primary) hypertension: Secondary | ICD-10-CM

## 2020-03-18 ENCOUNTER — Encounter: Payer: Self-pay | Admitting: Family Medicine

## 2020-03-29 ENCOUNTER — Encounter: Payer: Self-pay | Admitting: Family Medicine

## 2020-03-29 DIAGNOSIS — F410 Panic disorder [episodic paroxysmal anxiety] without agoraphobia: Secondary | ICD-10-CM

## 2020-03-30 ENCOUNTER — Other Ambulatory Visit: Payer: Self-pay

## 2020-03-30 ENCOUNTER — Other Ambulatory Visit: Payer: PRIVATE HEALTH INSURANCE

## 2020-03-30 DIAGNOSIS — Z20822 Contact with and (suspected) exposure to covid-19: Secondary | ICD-10-CM

## 2020-03-31 LAB — NOVEL CORONAVIRUS, NAA: SARS-CoV-2, NAA: NOT DETECTED

## 2020-03-31 LAB — SARS-COV-2, NAA 2 DAY TAT

## 2020-03-31 LAB — SPECIMEN STATUS REPORT

## 2020-04-26 MED ORDER — SERTRALINE HCL 50 MG PO TABS: 50.0000 mg | ORAL_TABLET | Freq: Every day | ORAL | 4 refills | Status: DC

## 2020-05-16 ENCOUNTER — Other Ambulatory Visit: Payer: Self-pay | Admitting: Family Medicine

## 2020-05-17 ENCOUNTER — Other Ambulatory Visit: Payer: Self-pay | Admitting: Family Medicine

## 2020-05-30 ENCOUNTER — Other Ambulatory Visit: Payer: Self-pay | Admitting: Family Medicine

## 2020-05-30 NOTE — Telephone Encounter (Signed)
Requested Prescriptions  Pending Prescriptions Disp Refills  . irbesartan-hydrochlorothiazide (AVALIDE) 300-12.5 MG tablet [Pharmacy Med Name: IRBESARTAN/HCTZ 300-12.5MG  TABLETS] 57 tablet 0    Sig: TAKE 1 TABLET BY MOUTH DAILY     Cardiovascular: ARB + Diuretic Combos Failed - 05/30/2020 10:16 AM      Failed - K in normal range and within 180 days    Potassium  Date Value Ref Range Status  07/18/2019 4.2 3.5 - 5.2 mmol/L Final  12/09/2013 3.6 3.5 - 5.1 mmol/L Final         Failed - Na in normal range and within 180 days    Sodium  Date Value Ref Range Status  07/18/2019 142 134 - 144 mmol/L Final  12/09/2013 141 136 - 145 mmol/L Final         Failed - Cr in normal range and within 180 days    Creatinine  Date Value Ref Range Status  12/09/2013 1.07 0.60 - 1.30 mg/dL Final   Creatinine, Ser  Date Value Ref Range Status  07/18/2019 0.99 0.76 - 1.27 mg/dL Final   Creatinine, POC  Date Value Ref Range Status  09/15/2015 n/a mg/dL Final         Failed - Ca in normal range and within 180 days    Calcium  Date Value Ref Range Status  07/18/2019 9.5 8.7 - 10.2 mg/dL Final   Calcium, Total  Date Value Ref Range Status  12/09/2013 8.1 (L) 8.5 - 10.1 mg/dL Final         Failed - Last BP in normal range    BP Readings from Last 1 Encounters:  07/18/19 (!) 151/91         Failed - Valid encounter within last 6 months    Recent Outpatient Visits          10 months ago Annual physical exam   [facility] Malva Limes, MD   1 year ago Annual physical exam   [facility] Malva Limes, MD   2 years ago Type 2 diabetes mellitus without complication, without long-term current use of insulin [facility])   [facility] Malva Limes, MD   3 years ago Type 2 diabetes mellitus without complication, without long-term current use of insulin [facility])   [facility] Malva Limes, MD   3 years ago Annual physical exam    [facility] Malva Limes, MD      Future Appointments            In 1 month Fisher, Demetrios Isaacs, MD [facility]           Passed - Patient is not pregnant

## 2020-07-23 ENCOUNTER — Ambulatory Visit (INDEPENDENT_AMBULATORY_CARE_PROVIDER_SITE_OTHER): Payer: Managed Care, Other (non HMO) | Admitting: Family Medicine

## 2020-07-23 ENCOUNTER — Encounter: Payer: Self-pay | Admitting: Family Medicine

## 2020-07-23 ENCOUNTER — Other Ambulatory Visit: Payer: Self-pay

## 2020-07-23 VITALS — BP 132/86 | HR 54 | Temp 97.0°F | Resp 18 | Ht 72.0 in | Wt 335.4 lb

## 2020-07-23 DIAGNOSIS — E119 Type 2 diabetes mellitus without complications: Secondary | ICD-10-CM | POA: Diagnosis not present

## 2020-07-23 DIAGNOSIS — I10 Essential (primary) hypertension: Secondary | ICD-10-CM

## 2020-07-23 DIAGNOSIS — F413 Other mixed anxiety disorders: Secondary | ICD-10-CM

## 2020-07-23 DIAGNOSIS — Z0000 Encounter for general adult medical examination without abnormal findings: Secondary | ICD-10-CM

## 2020-07-23 NOTE — Patient Instructions (Addendum)
. Please review the attached list of medications and notify my office if there are any errors.    The current recommendation is to start colon cancer screening with a colonoscopy or Cologuard test at age 50. I suggest checking with insurance to see if this screening test is covered.    . Please contact your eyecare professional to schedule a routine eye exam   Preventive Care 37-45 Years Old, Male Preventive care refers to lifestyle choices and visits with your health care provider that can promote health and wellness. This includes:  A yearly physical exam. This is also called an annual wellness visit.  Regular dental and eye exams.  Immunizations.  Screening for certain conditions.  Healthy lifestyle choices, such as: ? Eating a healthy diet. ? Getting regular exercise. ? Not using drugs or products that contain nicotine and tobacco. ? Limiting alcohol use. What can I expect for my preventive care visit? Physical exam Your health care provider will check your:  Height and weight. These may be used to calculate your BMI (body mass index). BMI is a measurement that tells if you are at a healthy weight.  Heart rate and blood pressure.  Body temperature.  Skin for abnormal spots. Counseling Your health care provider may ask you questions about your:  Past medical problems.  Family's medical history.  Alcohol, tobacco, and drug use.  Emotional well-being.  Home life and relationship well-being.  Sexual activity.  Diet, exercise, and sleep habits.  Work and work Astronomer.  Access to firearms. What immunizations do I need? Vaccines are usually given at various ages, according to a schedule. Your health care provider will recommend vaccines for you based on your age, medical history, and lifestyle or other factors, such as travel or where you work.   What tests do I need? Blood tests  Lipid and cholesterol levels. These may be checked every 5 years, or more  often if you are over 35 years old.  Hepatitis C test.  Hepatitis B test. Screening  Lung cancer screening. You may have this screening every year starting at age 33 if you have a 30-pack-year history of smoking and currently smoke or have quit within the past 15 years.  Prostate cancer screening. Recommendations will vary depending on your family history and other risks.  Genital exam to check for testicular cancer or hernias.  Colorectal cancer screening. ? All adults should have this screening starting at age 66 and continuing until age 60. ? Your health care provider may recommend screening at age 24 if you are at increased risk. ? You will have tests every 1-10 years, depending on your results and the type of screening test.  Diabetes screening. ? This is done by checking your blood sugar (glucose) after you have not eaten for a while (fasting). ? You may have this done every 1-3 years.  STD (sexually transmitted disease) testing, if you are at risk. Follow these instructions at home: Eating and drinking  Eat a diet that includes fresh fruits and vegetables, whole grains, lean protein, and low-fat dairy products.  Take vitamin and mineral supplements as recommended by your health care provider.  Do not drink alcohol if your health care provider tells you not to drink.  If you drink alcohol: ? Limit how much you have to 0-2 drinks a day. ? Be aware of how much alcohol is in your drink. In the [place] one drink equals one 12 oz bottle of beer (355 mL), one 5  oz glass of wine (148 mL), or one 1 oz glass of hard liquor (44 mL).   Lifestyle  Take daily care of your teeth and gums. Brush your teeth every morning and night with fluoride toothpaste. Floss one time each day.  Stay active. Exercise for at least 30 minutes 5 or more days each week.  Do not use any products that contain nicotine or tobacco, such as cigarettes, e-cigarettes, and chewing tobacco. If you need help  quitting, ask your health care provider.  Do not use drugs.  If you are sexually active, practice safe sex. Use a condom or other form of protection to prevent STIs (sexually transmitted infections).  If told by your health care provider, take low-dose aspirin daily starting at age 48.  Find healthy ways to cope with stress, such as: ? Meditation, yoga, or listening to music. ? Journaling. ? Talking to a trusted person. ? Spending time with friends and family. Safety  Always wear your seat belt while driving or riding in a vehicle.  Do not drive: ? If you have been drinking alcohol. Do not ride with someone who has been drinking. ? When you are tired or distracted. ? While texting.  Wear a helmet and other protective equipment during sports activities.  If you have firearms in your house, make sure you follow all gun safety procedures. What's next?  Go to your health care provider once a year for an annual wellness visit.  Ask your health care provider how often you should have your eyes and teeth checked.  Stay up to date on all vaccines. This information is not intended to replace advice given to you by your health care provider. Make sure you discuss any questions you have with your health care provider. Document Revised: 11/12/2018 Document Reviewed: 02/07/2018 Elsevier Patient Education  2021 [employer].

## 2020-07-23 NOTE — Progress Notes (Signed)
Complete physical exam   Patient: [name]   DOB: [birth year]   50 y.o. Male  MRN: [id number] Visit Date: 07/23/2020  Today's healthcare provider: Lelon Huh, MD   Chief Complaint  Patient presents with  . Annual Exam  . Diabetes  . Headache  . Anxiety   Subjective    [name] is a 50 y.o. male who presents today for a complete physical exam.  He reports consuming a general diet. Home exercise routine includes walking. He generally feels fairly well. He reports sleeping fairly well. He does not have additional problems to discuss today. His wife was diagnosed with breast cancer earlier this year requiring surgery and radiation treatments, so he has not been focused on diet and exercise. She is now recovering and they are both planning to start walking every day to get back in better health.   HPI  Diabetes Mellitus Type II, Follow-up  Lab Results  Component Value Date   HGBA1C 5.3 07/18/2019   HGBA1C 5.5 07/15/2018   HGBA1C 4.9 11/15/2017   Wt Readings from Last 3 Encounters:  07/23/20 (!) 335 lb 6.4 oz (152.1 kg)  07/18/19 (!) 336 lb (152.4 kg)  03/10/19 (!) 323 lb (146.5 kg)   Last seen for diabetes 1 year ago.  Management since then includes continuing a healthy diet. He reports good compliance with treatment. He is not having side effects.  Symptoms: No fatigue No foot ulcerations  No appetite changes No nausea  No paresthesia of the feet  No polydipsia  No polyuria No visual disturbances   No vomiting     Home blood sugar records: blood sugars are not checked  Episodes of hypoglycemia? No    Current insulin regiment: none Most Recent Eye Exam: not UTD Current exercise: walking Current diet habits: in general, an "unhealthy" diet  Pertinent Labs: Lab Results  Component Value Date   CHOL 126 07/18/2019   HDL 50 07/18/2019   LDLCALC 63 07/18/2019   TRIG 62 07/18/2019   CHOLHDL 2.5 07/18/2019   Lab Results  Component Value Date   NA  142 07/18/2019   K 4.2 07/18/2019   CREATININE 0.99 07/18/2019   GFRNONAA 90 07/18/2019   GFRAA 104 07/18/2019   GLUCOSE 114 (H) 07/18/2019     ---------------------------------------------------------------------------------------------------  Hypertension, follow-up  BP Readings from Last 3 Encounters:  07/23/20 132/86  07/18/19 (!) 151/91  03/10/19 (!) 146/86   Wt Readings from Last 3 Encounters:  07/23/20 (!) 335 lb 6.4 oz (152.1 kg)  07/18/19 (!) 336 lb (152.4 kg)  03/10/19 (!) 323 lb (146.5 kg)     He was last seen for hypertension 1 year ago.  BP at that visit was 151/91. Management since that visit includes continuing same medication.  He reports good compliance with treatment. He is not having side effects.  He is following a Regular diet. He is exercising. He does not smoke.  Use of agents associated with hypertension: none.   Outside blood pressures are checked and average 140/80. Symptoms: No chest pain No chest pressure  No palpitations No syncope  No dyspnea No orthopnea  No paroxysmal nocturnal dyspnea No lower extremity edema   Pertinent labs: Lab Results  Component Value Date   CHOL 126 07/18/2019   HDL 50 07/18/2019   LDLCALC 63 07/18/2019   TRIG 62 07/18/2019   CHOLHDL 2.5 07/18/2019   Lab Results  Component Value Date   NA 142 07/18/2019   K  4.2 07/18/2019   CREATININE 0.99 07/18/2019   GFRNONAA 90 07/18/2019   GFRAA 104 07/18/2019   GLUCOSE 114 (H) 07/18/2019     The ASCVD Risk score [name] DC Jr., et al., 2013) failed to calculate for the following reasons:   The valid total cholesterol range is 130 to 320 mg/dL   ---------------------------------------------------------------------------------------------------  Anxiety, Follow-up  He was last seen for anxiety 1 year ago. Changes made at last visit include none; continue same dose of Sertraline.   He reports good compliance with treatment. He reports good tolerance of  treatment. He is not having side effects.   He feels his anxiety is moderate and stabe since last visit.  Symptoms: No chest pain No difficulty concentrating  No dizziness No fatigue  No feelings of losing control No insomnia  No irritable No palpitations  No panic attacks No racing thoughts  No shortness of breath No sweating  No tremors/shakes    GAD-7 Results No flowsheet data found.  PHQ-9 Scores PHQ9 SCORE ONLY 07/23/2020 07/18/2019 07/15/2018  PHQ-9 Total Score _0 ---------------------------------------------------------------------------------------------------  Past Medical History:  Diagnosis Date  . History of hydrocele    Past Surgical History:  Procedure Laterality Date  . ANTERIOR CERVICAL DECOMP/DISCECTOMY FUSION  03/2017   Dr. Arnoldo Morale, [place]  . KNEE SURGERY  2006   left knee reconstruction after tibial fracture  . LEG SURGERY  1975   leg and foot surgery; right lower leg circa   Social History   Socioeconomic History  . Marital status: Married    Spouse name: Not on file  . Number of children: 0  . Years of education: Not on file  . Highest education level: Not on file  Occupational History  . Occupation: [employer]    Comment: Employed  Tobacco Use  . Smoking status: Never Smoker  . Smokeless tobacco: Never Used  Vaping Use  . Vaping Use: Never used  Substance and Sexual Activity  . Alcohol use: No    Alcohol/week: 0.0 standard drinks  . Drug use: No  . Sexual activity: Yes    Birth control/protection: None  Other Topics Concern  . Not on file  Social History Narrative  . Not on file   Social Determinants of Health   Financial Resource Strain: Not on file  Food Insecurity: Not on file  Transportation Needs: Not on file  Physical Activity: Not on file  Stress: Not on file  Social Connections: Not on file  Intimate Partner Violence: Not on file   Family Status  Relation Name Status  . Mother  Alive  . Father   Deceased at age 22       Cause of Death: Stomach and pancreatic cancer  . Sister HALF Alive  . Brother HALF Alive  . Other Gen. family hx (Not Specified)  . Neg Hx  (Not Specified)   Family History  Problem Relation Age of Onset  . Panic disorder Mother   . Diabetes Mother   . Alcohol abuse Father   . Pancreatic cancer Father   . Stomach cancer Father   . Lung cancer Father   . Drug abuse Sister   . Healthy Brother   . Prostate cancer Other   . Lung cancer Other   . Anxiety disorder Other   . Colon cancer Neg Hx   . Heart disease Neg Hx    Allergies  Allergen Reactions  . Doxycycline Rash  . Amlodipine Besylate  Constipation, palitations, swelling, shortness of breath  . Augmentin [Amoxicillin-Pot Clavulanate]     intolerant: Causes insomnia  . Cyclobenzaprine   . Indomethacin Swelling    Caused Fluid retention  . Keflex  [Cephalexin]     intolerant  . Medrol [Methylprednisolone] Palpitations    Patient Care Team: Birdie Sons, MD as PCP - General (Family Medicine) Elvina Mattes, Adele Schilder as Attending Physician (Podiatry) Anell Barr, OD (Optometry)   Medications: Outpatient Medications Prior to Visit  Medication Sig  . acetaminophen (TYLENOL) 500 MG tablet Take 500 mg by mouth every 6 (six) hours as needed.  . hydrochlorothiazide (MICROZIDE) 12.5 MG capsule Take 1 capsule (12.5 mg total) by mouth daily.  Marland Kitchen ibuprofen (ADVIL) 800 MG tablet TAKE 1 TABLET BY MOUTH TWICE DAILY AS NEEDED  . irbesartan-hydrochlorothiazide (AVALIDE) 300-12.5 MG tablet TAKE 1 TABLET BY MOUTH DAILY  . ONE TOUCH ULTRA TEST test strip USE UP TO 3 TIMES DAILY AS DIRECTED  . phenazopyridine (PYRIDIUM) 200 MG tablet Take 1 tablet (200 mg total) by mouth 3 (three) times daily as needed for pain.  . Probiotic Product (PROBIOTIC DAILY PO) Take by mouth.  . psyllium (REGULOID) 0.52 g capsule Take 0.52 g by mouth daily.  . sertraline (ZOLOFT) 50 MG tablet Take 1 tablet (50 mg total) by  mouth daily.   No facility-administered medications prior to visit.    Review of Systems  Constitutional: Negative for appetite change, chills, fatigue and fever.  HENT: Negative for congestion, ear pain, hearing loss, nosebleeds and trouble swallowing.   Eyes: Negative for pain and visual disturbance.  Respiratory: Positive for cough (ongoing since 2020). Negative for chest tightness and shortness of breath.   Cardiovascular: Negative for chest pain, palpitations and leg swelling.  Gastrointestinal: Negative for abdominal pain, blood in stool, constipation, diarrhea, nausea and vomiting.  Endocrine: Negative for polydipsia, polyphagia and polyuria.  Genitourinary: Negative for dysuria and flank pain.  Musculoskeletal: Negative for arthralgias, back pain, joint swelling, myalgias and neck stiffness.  Skin: Negative for color change, rash and wound.  Neurological: Positive for headaches. Negative for dizziness, tremors, seizures, speech difficulty, weakness and light-headedness.  Psychiatric/Behavioral: Negative for behavioral problems, confusion, decreased concentration, dysphoric mood and sleep disturbance. The patient is nervous/anxious.   All other systems reviewed and are negative.     Objective    BP 132/86 (BP Location: Left Arm, Patient Position: Sitting, Cuff Size: Large)   Pulse (!) 54   Temp (!) 97 F (36.1 C) (Temporal)   Resp 18   Ht 6' (1.829 m)   Wt (!) 335 lb 6.4 oz (152.1 kg)   BMI 45.49 kg/m    Physical Exam   General Appearance:    Severely obese male. Alert, cooperative, in no acute distress, appears stated age  Head:    Normocephalic, without obvious abnormality, atraumatic  Eyes:    PERRL, conjunctiva/corneas clear       Ears:    Normal TM's. Cerumen in both external ear canals  Nose:   Nares normal, septum midline, mucosa normal, no drainage   or sinus tenderness  Throat:   Lips, mucosa, and tongue normal; teeth and gums normal  Neck:   Supple,  symmetrical, trachea midline, no adenopathy;       thyroid:  No enlargement/tenderness/nodules; no carotid   bruit or JVD  Back:     Symmetric, no curvature, ROM normal, no CVA tenderness  Lungs:     Clear to auscultation bilaterally, respirations unlabored  Chest  wall:    No tenderness or deformity  Heart:    Bradycardic. Normal rhythm. No murmurs, rubs, or gallops.  S1 and S2 normal  Abdomen:     Soft, non-tender, bowel sounds active all four quadrants,    no masses, no organomegaly  Genitalia:    deferred  Rectal:    deferred  Extremities:   All extremities are intact. No cyanosis or edema  Skin:   Skin color, texture, turgor normal, no rashes or lesions     Last depression screening scores PHQ 2/9 Scores 07/23/2020 07/18/2019 07/15/2018  PHQ - 2 Score _0 PHQ- 9 Score _1 Last fall risk screening Fall Risk  07/23/2020  Falls in the past year? 1  Number falls in past yr: 0  Injury with Fall? 0  Follow up Falls evaluation completed   Last Audit-C alcohol use screening Alcohol Use Disorder Test (AUDIT) 07/23/2020  1. How often do you have a drink containing alcohol? 0  2. How many drinks containing alcohol do you have on a typical day when you are drinking? 0  3. How often do you have six or more drinks on one occasion? 0  AUDIT-C Score 0  Alcohol Brief Interventions/Follow-up -   A score of 3 or more in women, and 4 or more in men indicates increased risk for alcohol abuse, EXCEPT if all of the points are from question 1   No results found for any visits on 07/23/20.  Assessment & Plan    Routine Health Maintenance and Physical Exam  Exercise Activities and Dietary recommendations Goals   None     Immunization History  Administered Date(s) Administered  . Influenza-Unspecified 11/28/2014, 12/11/2016  . MMR 02/21/2017, 03/23/2017  . Moderna Sars-Covid-2 Vaccination 01/19/2020  . PFIZER(Purple Top)SARS-COV-2 Vaccination 03/31/2019, 04/21/2019  . PPD Test  02/26/2017  . Pneumococcal Polysaccharide-23 08/05/2008, 01/08/2013  . Td 11/18/1998, 11/15/2017  . Tdap 10/02/2007    Health Maintenance  Topic Date Due  . FOOT EXAM  Never done  . HIV Screening  Never done  . Hepatitis C Screening  Never done  . COLONOSCOPY (Pts 45-48yr Insurance coverage will need to be confirmed)  Never done  . OPHTHALMOLOGY EXAM  05/02/2018  . HEMOGLOBIN A1C  01/18/2020  . INFLUENZA VACCINE  09/27/2020  . Zoster Vaccines- Shingrix (1 of 2) 12/06/2020  . TETANUS/TDAP  11/16/2027  . PNEUMOCOCCAL POLYSACCHARIDE VACCINE AGE 34-64 HIGH RISK  Completed  . COVID-19 Vaccine  Completed  . HPV VACCINES  Aged Out    Discussed health benefits of physical activity, and encouraged him to engage in regular exercise appropriate for his age and condition.  1. Essential (primary) hypertension Very well controlled.  - Comprehensive metabolic panel  2. Type 2 diabetes mellitus without complication, without long-term current use of insulin (HCC) a1c has been normal off of meds, but not checked since May of 2021.  - Lipid panel - Hemoglobin A1c - CBC  Reminded he is due for diabetic eye exam.   3. Annual physical exam  - Comprehensive metabolic panel - Lipid panel - Hemoglobin A1c - CBC  4. Other mixed anxiety disorders Doing well on sertraline. Continue current medications.        The entirety of the information documented in the History of Present Illness, Review of Systems and Physical Exam were personally obtained by me. Portions of this information were initially documented by the CMA and reviewed by me for thoroughness and accuracy.  Lelon Huh, MD  [facility] [phone] (phone) [phone] (fax)  [place]

## 2020-07-24 ENCOUNTER — Encounter: Payer: Self-pay | Admitting: Family Medicine

## 2020-07-24 LAB — COMPREHENSIVE METABOLIC PANEL
ALT: 26 IU/L (ref 0–44)
AST: 18 IU/L (ref 0–40)
Albumin/Globulin Ratio: 1.9 (ref 1.2–2.2)
Albumin: 4.3 g/dL (ref 4.0–5.0)
Alkaline Phosphatase: 50 IU/L (ref 44–121)
BUN/Creatinine Ratio: 15 (ref 9–20)
BUN: 15 mg/dL (ref 6–24)
Bilirubin Total: 0.7 mg/dL (ref 0.0–1.2)
CO2: 27 mmol/L (ref 20–29)
Calcium: 9.3 mg/dL (ref 8.7–10.2)
Chloride: 103 mmol/L (ref 96–106)
Creatinine, Ser: 0.97 mg/dL (ref 0.76–1.27)
Globulin, Total: 2.3 g/dL (ref 1.5–4.5)
Glucose: 162 mg/dL — ABNORMAL HIGH (ref 65–99)
Potassium: 4.7 mmol/L (ref 3.5–5.2)
Sodium: 143 mmol/L (ref 134–144)
Total Protein: 6.6 g/dL (ref 6.0–8.5)
eGFR: 96 mL/min/{1.73_m2} (ref >59)

## 2020-07-24 LAB — CBC
Hematocrit: 41.5 % (ref 37.5–51.0)
Hemoglobin: 14.4 g/dL (ref 13.0–17.7)
MCH: 30.9 pg (ref 26.6–33.0)
MCHC: 34.7 g/dL (ref 31.5–35.7)
MCV: 89 fL (ref 79–97)
Platelets: 220 10*3/uL (ref 150–450)
RBC: 4.66 x10E6/uL (ref 4.14–5.80)
RDW: 12.7 % (ref 11.6–15.4)
WBC: 7.1 10*3/uL (ref 3.4–10.8)

## 2020-07-24 LAB — LIPID PANEL
Chol/HDL Ratio: 2.7 ratio (ref 0.0–5.0)
Cholesterol, Total: 145 mg/dL (ref 100–199)
HDL: 53 mg/dL (ref >39)
LDL Chol Calc (NIH): 78 mg/dL (ref 0–99)
Triglycerides: 72 mg/dL (ref 0–149)
VLDL Cholesterol Cal: 14 mg/dL (ref 5–40)

## 2020-07-24 LAB — HEMOGLOBIN A1C
Est. average glucose Bld gHb Est-mCnc: 154 mg/dL
Hgb A1c MFr Bld: 7 % — ABNORMAL HIGH (ref 4.8–5.6)

## 2020-07-25 ENCOUNTER — Other Ambulatory Visit: Payer: Self-pay | Admitting: Family Medicine

## 2020-07-25 NOTE — Telephone Encounter (Signed)
Requested Prescriptions  Pending Prescriptions Disp Refills  . irbesartan-hydrochlorothiazide (AVALIDE) 300-12.5 MG tablet [Pharmacy Med Name: IRBESARTAN/HCTZ 300-12.5MG  TABLETS] 90 tablet 1    Sig: TAKE 1 TABLET BY MOUTH DAILY     Cardiovascular: ARB + Diuretic Combos Passed - 07/25/2020  9:12 AM      Passed - K in normal range and within 180 days    Potassium  Date Value Ref Range Status  07/23/2020 4.7 3.5 - 5.2 mmol/L Final  12/09/2013 3.6 3.5 - 5.1 mmol/L Final         Passed - Na in normal range and within 180 days    Sodium  Date Value Ref Range Status  07/23/2020 143 134 - 144 mmol/L Final  12/09/2013 141 136 - 145 mmol/L Final         Passed - Cr in normal range and within 180 days    Creatinine  Date Value Ref Range Status  12/09/2013 1.07 0.60 - 1.30 mg/dL Final   Creatinine, Ser  Date Value Ref Range Status  07/23/2020 0.97 0.76 - 1.27 mg/dL Final   Creatinine, POC  Date Value Ref Range Status  09/15/2015 n/a mg/dL Final         Passed - Ca in normal range and within 180 days    Calcium  Date Value Ref Range Status  07/23/2020 9.3 8.7 - 10.2 mg/dL Final   Calcium, Total  Date Value Ref Range Status  12/09/2013 8.1 (L) 8.5 - 10.1 mg/dL Final         Passed - Patient is not pregnant      Passed - Last BP in normal range    BP Readings from Last 1 Encounters:  07/23/20 132/86         Passed - Valid encounter within last 6 months    Recent Outpatient Visits          2 days ago Essential (primary) hypertension   [facility] Malva Limes, MD   1 year ago Annual physical exam   [facility] Malva Limes, MD   2 years ago Annual physical exam   [facility] Malva Limes, MD   2 years ago Type 2 diabetes mellitus without complication, without long-term current use of insulin [facility])   [facility] Malva Limes, MD   3 years ago Type 2 diabetes mellitus without complication, without  long-term current use of insulin [facility])   [facility] Sherrie Mustache, Demetrios Isaacs, MD

## 2020-07-27 ENCOUNTER — Telehealth: Payer: Self-pay

## 2020-07-27 ENCOUNTER — Ambulatory Visit: Payer: Self-pay | Admitting: *Deleted

## 2020-07-27 NOTE — Telephone Encounter (Signed)
Pt tested positive for COVID and wanted to know what his next steps should be. Pt wanted to discuss with a nurse     Called patient to review questions regarding covid positive symptoms. C/o headache, cough, scratchy throat started yesterday . Denies difficulty breathing , sob, fever. Patient reports he was notified he would be receiving prescription paxlovid from PCP due to co morbidities. Reviewed CDC guidelines with patient. Criteria for self-isolation if you test positive for COVID-19, regardless of vaccination status:  -If you have mild symptoms that are resolving or have resolved, isolate at home for 5 days since symptoms started AND continue to wear a well-fitted mask when around others in the home and in public for 5 additional days after isolation is completed -If you have a fever and/or moderate to severe symptoms, isolate for at least 10 days since the symptoms started AND until you are fever free for at least 24 hours without the use of fever-reducing medications -If you tested positive and did not have symptoms, isolate for at least 5 days after your positive test  Use over-the-counter medications for symptoms.If you develop respiratory issues/distress, seek medical care in the Emergency Department.  If you must leave home or if you have to be around others please wear a mask. Please limit contact with immediate family members in the home, practice social distancing, frequent handwashing and clean hard surfaces touched frequently with household cleaning products. Members of your household will also need to quarantine and test. Patient reports he is concerned for his wife, that she may also be positive for covid and has recent cancer treatments and radiation. Wife will take at home test tonight and notify Dr. Beryle Flock if positive or has symptoms. Care advise given. Patient verbalized understanding of care advise and to call back or go to [facility] or ED if symptoms worsen.  Reason for  Disposition . [1] HIGH RISK for severe COVID complications (e.g., weak immune system, age > 64 years, obesity with BMI > 25, pregnant, chronic lung disease or other chronic medical condition) AND [2] COVID symptoms (e.g., cough, fever)  (Exceptions: Already seen by PCP and no new or worsening symptoms.)  Answer Assessment - Initial Assessment Questions 1. COVID-19 DIAGNOSIS: "Who made your COVID-19 diagnosis?" "Was it confirmed by a positive lab test or self-test?" If not diagnosed by a doctor (or NP/PA), ask "Are there lots of cases (community spread) where you live?" Note: See public health department website, if unsure.     At home covid test  2. COVID-19 EXPOSURE: "Was there any known exposure to COVID before the symptoms began?" CDC Definition of close contact: within 6 feet (2 meters) for a total of 15 minutes or more over a 24-hour period.      Na  3. ONSET: "When did the COVID-19 symptoms start?"      Yesterday  4. WORST SYMPTOM: "What is your worst symptom?" (e.g., cough, fever, shortness of breath, muscle aches)     Headache, scratchy throat, cough 5. COUGH: "Do you have a cough?" If Yes, ask: "How bad is the cough?"       Yes not bad 6. FEVER: "Do you have a fever?" If Yes, ask: "What is your temperature, how was it measured, and when did it start?"     No  7. RESPIRATORY STATUS: "Describe your breathing?" (e.g., shortness of breath, wheezing, unable to speak)      no 8. BETTER-SAME-WORSE: "Are you getting better, staying the same or getting worse compared  to yesterday?"  If getting worse, ask, "In what way?"     better 9. HIGH RISK DISEASE: "Do you have any chronic medical problems?" (e.g., asthma, heart or lung disease, weak immune system, obesity, etc.)     HTN, diabetes  10. VACCINE: "Have you had the COVID-19 vaccine?" If Yes, ask: "Which one, how many shots, when did you get it?"       Yes-pfizer 11. BOOSTER: "Have you received your COVID-19 booster?" If Yes, ask: "Which one  and when did you get it?"       Yes- moderna  12. PREGNANCY: "Is there any chance you are pregnant?" "When was your last menstrual period?"       na 13. OTHER SYMPTOMS: "Do you have any other symptoms?"  (e.g., chills, fatigue, headache, loss of smell or taste, muscle pain, sore throat)       Headache, cough, scratchy throat 14. O2 SATURATION MONITOR:  "Do you use an oxygen saturation monitor (pulse oximeter) at home?" If Yes, ask "What is your reading (oxygen level) today?" "What is your usual oxygen saturation reading?" (e.g., 95%)       na  Protocols used: CORONAVIRUS (COVID-19) DIAGNOSED OR SUSPECTED-A-AH

## 2020-07-27 NOTE — Telephone Encounter (Signed)
Copied from CRM [id number]. Topic: General - Other >> Jul 27, 2020  2:40 PM Mcneil, Ja-Kwan wrote: Reason for CRM: Pt called to report that he took a home Covid test and it shows positive for Covid. Pt would like to know if there are any prescription medications that he will need to start. Pt requests that any prescriptions be sent to [id number] - [place],  - [address] AT [facility] OF [address] [facility] [facility]

## 2020-07-28 MED ORDER — NIRMATRELVIR/RITONAVIR (PAXLOVID)TABLET: 3.0000 | ORAL_TABLET | Freq: Two times a day (BID) | ORAL | 0 refills | Status: AC

## 2020-07-28 NOTE — Addendum Note (Signed)
Addended by: [name] E on: 07/28/2020 11:05 AM   Modules accepted: Orders

## 2020-08-10 ENCOUNTER — Encounter: Payer: Self-pay | Admitting: Family Medicine

## 2020-08-10 NOTE — Telephone Encounter (Signed)
Please review.  I don't see where a prescription went to [facility].  Do you remember sending [facility] to [facility]?  Thanks,   -Vernona Rieger

## 2020-09-15 ENCOUNTER — Telehealth: Payer: Self-pay

## 2020-09-15 ENCOUNTER — Other Ambulatory Visit: Payer: Self-pay

## 2020-09-15 ENCOUNTER — Encounter: Payer: Self-pay | Admitting: Nurse Practitioner

## 2020-09-15 ENCOUNTER — Ambulatory Visit: Payer: Managed Care, Other (non HMO) | Admitting: Nurse Practitioner

## 2020-09-15 VITALS — BP 138/83 | HR 55 | Wt 333.0 lb

## 2020-09-15 DIAGNOSIS — R42 Dizziness and giddiness: Secondary | ICD-10-CM | POA: Diagnosis not present

## 2020-09-15 MED ORDER — MECLIZINE HCL 25 MG PO TABS: 25.0000 mg | ORAL_TABLET | Freq: Three times a day (TID) | ORAL | 0 refills | Status: DC | PRN

## 2020-09-15 NOTE — Telephone Encounter (Signed)
Appointment scheduled with [facility] today at 1:20pm. Patient advised of appointment.

## 2020-09-15 NOTE — Progress Notes (Signed)
BP 138/83   Pulse (!) 55   Wt (!) 333 lb (151 kg)   SpO2 98%   BMI 45.16 kg/m    Subjective:    Patient ID: [name], male    DOB: [birth year], 50 y.o.   MRN: [id number]  HPI: [name] is a 50 y.o. male  Chief Complaint  Patient presents with   Dizziness    DIZZINESS Patient states that on Monday around 1-2 pm he started feeling "off".  Then he developed a headache and by 6pm he developed a whooshing sound in his ears and the headache worsened. The next day he woke up feeling fine but then around 3pm the dizziness returned with some tinnitus and some headache last night. Everything resolved by 11pm it was gone besides the tinnitus which has continued to worsen into today. He states the symptoms have improved some but it is still very noticeable. Patient states he did have some nausea/upset stomach but that has resolved.  He does not feel like they are connected.   Duration: days Description of symptoms: room spinning Duration of episode: hours Dizziness frequency: no history of the same Provoking factors:  walking Aggravating factors:   moving Triggered by rolling over in bed: no Triggered by bending over: yes Aggravated by head movement: yes Aggravated by exertion, coughing, loud noises: yes Recent head injury: no Recent or current viral symptoms: no History of vasovagal episodes: no Nausea: yes Vomiting: no Tinnitus: yes Hearing loss: no Aural fullness: no Headache: yes Photophobia/phonophobia: yes Unsteady gait: yes Postural instability: no Diplopia, dysarthria, dysphagia or weakness: no Related to exertion: yes Pallor: no Diaphoresis: no Dyspnea: no Chest pain: no  Relevant past medical, surgical, family and social history reviewed and updated as indicated. Interim medical history since our last visit reviewed. Allergies and medications reviewed and updated.  Review of Systems  HENT:  Positive for tinnitus. Negative for hearing loss.   Eyes:   Positive for photophobia.  Respiratory:  Negative for shortness of breath.   Cardiovascular:  Negative for chest pain.  Gastrointestinal:  Positive for nausea. Negative for vomiting.  Neurological:  Positive for dizziness and headaches.   Per HPI unless specifically indicated above     Objective:    BP 138/83   Pulse (!) 55   Wt (!) 333 lb (151 kg)   SpO2 98%   BMI 45.16 kg/m   Wt Readings from Last 3 Encounters:  09/15/20 (!) 333 lb (151 kg)  07/23/20 (!) 335 lb 6.4 oz (152.1 kg)  07/18/19 (!) 336 lb (152.4 kg)    Physical Exam Vitals and nursing note reviewed.  Constitutional:      General: He is not in acute distress.    Appearance: Normal appearance. He is obese. He is not ill-appearing, toxic-appearing or diaphoretic.  HENT:     Head: Normocephalic.     Right Ear: External ear normal. A middle ear effusion is present.     Left Ear: External ear normal. A middle ear effusion is present.     Nose: Nose normal. No congestion or rhinorrhea.     Mouth/Throat:     Mouth: Mucous membranes are moist.  Eyes:     General:        Right eye: No discharge.        Left eye: No discharge.     Extraocular Movements: Extraocular movements intact.     Conjunctiva/sclera: Conjunctivae normal.     Pupils: Pupils are equal, round,  and reactive to light.  Cardiovascular:     Rate and Rhythm: Normal rate and regular rhythm.     Heart sounds: No murmur heard. Pulmonary:     Effort: Pulmonary effort is normal. No respiratory distress.     Breath sounds: Normal breath sounds. No wheezing, rhonchi or rales.  Abdominal:     General: Abdomen is flat. Bowel sounds are normal.  Musculoskeletal:     Cervical back: Normal range of motion and neck supple.  Skin:    General: Skin is warm and dry.     Capillary Refill: Capillary refill takes less than 2 seconds.  Neurological:     General: No focal deficit present.     Mental Status: He is alert and oriented to person, place, and time.   Psychiatric:        Mood and Affect: Mood normal.        Behavior: Behavior normal.        Thought Content: Thought content normal.        Judgment: Judgment normal.    Results for orders placed or performed in visit on 07/23/20  Comprehensive metabolic panel  Result Value Ref Range   Glucose 162 (H) 65 - 99 mg/dL   BUN 15 6 - 24 mg/dL   Creatinine, Ser 0.97 0.76 - 1.27 mg/dL   eGFR 96 >59 mL/min/1.73   BUN/Creatinine Ratio 15 9 - 20   Sodium 143 134 - 144 mmol/L   Potassium 4.7 3.5 - 5.2 mmol/L   Chloride 103 96 - 106 mmol/L   CO2 27 20 - 29 mmol/L   Calcium 9.3 8.7 - 10.2 mg/dL   Total Protein 6.6 6.0 - 8.5 g/dL   Albumin 4.3 4.0 - 5.0 g/dL   Globulin, Total 2.3 1.5 - 4.5 g/dL   Albumin/Globulin Ratio 1.9 1.2 - 2.2   Bilirubin Total 0.7 0.0 - 1.2 mg/dL   Alkaline Phosphatase 50 44 - 121 IU/L   AST 18 0 - 40 IU/L   ALT 26 0 - 44 IU/L  Lipid panel  Result Value Ref Range   Cholesterol, Total 145 100 - 199 mg/dL   Triglycerides 72 0 - 149 mg/dL   HDL 53 >39 mg/dL   VLDL Cholesterol Cal 14 5 - 40 mg/dL   LDL Chol Calc (NIH) 78 0 - 99 mg/dL   Chol/HDL Ratio 2.7 0.0 - 5.0 ratio  Hemoglobin A1c  Result Value Ref Range   Hgb A1c MFr Bld 7.0 (H) 4.8 - 5.6 %   Est. average glucose Bld gHb Est-mCnc 154 mg/dL  CBC  Result Value Ref Range   WBC 7.1 3.4 - 10.8 x10E3/uL   RBC 4.66 4.14 - 5.80 x10E6/uL   Hemoglobin 14.4 13.0 - 17.7 g/dL   Hematocrit 41.5 37.5 - 51.0 %   MCV 89 79 - 97 fL   MCH 30.9 26.6 - 33.0 pg   MCHC 34.7 31.5 - 35.7 g/dL   RDW 12.7 11.6 - 15.4 %   Platelets 220 150 - 450 x10E3/uL      Assessment & Plan:   Problem List Items Addressed This Visit   None Visit Diagnoses     Dizziness    -  Primary   Recommend using an antihistamine to help dry up fluid behind TM. Can use Meclizine PRN to help with symptoms. Return to clinic if symptoms worsen.        Follow up plan: Return if symptoms worsen or fail to improve.

## 2020-09-15 NOTE — Telephone Encounter (Signed)
Copied from CRM [id number]. Topic: General - Other >> Sep 15, 2020  8:10 AM Jaquita Rector A wrote: Reason for CRM: Patient called in to inform dr Sherrie Mustache that he has some ringing in his ears and vertigo started on 09/13/20 want to be seen today but nothing available. Please call  Ph# [phone]

## 2020-10-05 ENCOUNTER — Telehealth: Payer: PRIVATE HEALTH INSURANCE | Admitting: Family Medicine

## 2020-10-05 DIAGNOSIS — R300 Dysuria: Secondary | ICD-10-CM

## 2020-10-06 ENCOUNTER — Ambulatory Visit
Admission: EM | Admit: 2020-10-06 | Discharge: 2020-10-06 | Disposition: A | Discharge status: Home / Self Care | Payer: Managed Care, Other (non HMO) | Attending: Physician Assistant | Admitting: Physician Assistant

## 2020-10-06 ENCOUNTER — Other Ambulatory Visit: Payer: Self-pay

## 2020-10-06 ENCOUNTER — Encounter: Payer: Self-pay | Admitting: Emergency Medicine

## 2020-10-06 DIAGNOSIS — R739 Hyperglycemia, unspecified: Secondary | ICD-10-CM

## 2020-10-06 DIAGNOSIS — N3001 Acute cystitis with hematuria: Secondary | ICD-10-CM | POA: Diagnosis present

## 2020-10-06 DIAGNOSIS — R300 Dysuria: Secondary | ICD-10-CM | POA: Insufficient documentation

## 2020-10-06 LAB — URINALYSIS, COMPLETE (UACMP) WITH MICROSCOPIC
Bilirubin Urine: NEGATIVE
Glucose, UA: >1000 mg/dL — AB
Ketones, ur: NEGATIVE mg/dL
Nitrite: NEGATIVE
Specific Gravity, Urine: 1.02 (ref 1.005–1.030)
WBC, UA: >50 WBC/hpf (ref 0–5)
pH: 6 (ref 5.0–8.0)

## 2020-10-06 LAB — GLUCOSE, CAPILLARY: Glucose-Capillary: 270 mg/dL — ABNORMAL HIGH (ref 70–99)

## 2020-10-06 MED ORDER — NITROFURANTOIN MONOHYD MACRO 100 MG PO CAPS: 100.0000 mg | ORAL_CAPSULE | Freq: Two times a day (BID) | ORAL | 0 refills | Status: AC

## 2020-10-06 NOTE — Discharge Instructions (Addendum)
UTI: Based on either symptoms or urinalysis, you may have a urinary tract infection. We will send the urine for culture and call with results in a few days. Begin antibiotics at this time. Your symptoms should be much improved over the next 2-3 days. Increase rest and fluid intake. If for some reason symptoms are worsening or not improving after a couple of days or the urine culture determines the antibiotics you are taking will not treat the infection, the antibiotics may be changed. Return or go to ER for fever, back pain, worsening urinary pain, discharge, increased blood in urine. May take Tylenol or Motrin OTC for pain relief or consider AZO if no contraindications   Your blood sugar was elevated today at 270. Please continue with lifestyle modifications and follow-up with your PCP as you will likely need to cut back on antihyperglycemic medications.

## 2020-10-06 NOTE — ED Triage Notes (Signed)
Patient has a history of uti.  Attempted e-visit, was told to go to urgent care for evaluation.  Burning with urination, urgency, frequency, denies fever and states "no signs of prostatitis"

## 2020-10-06 NOTE — Progress Notes (Signed)
E-Visit for Urinary Problems  Based on what you shared with me, I feel your condition warrants further evaluation and I recommend that you be seen for a face to face office visit.  Male bladder infections are not very common.  We worry about prostate or kidney conditions.  The standard of care is to examine the abdomen and kidneys, and to do a urine and blood test to make sure that something more serious is not going on.  We recommend that you see a provider today.  If your doctor's office is closed [facility] has the following Urgent Cares:    NOTE: You will not be charged for this e-visit.  If you are having a true medical emergency please call 911.       For an urgent face to face visit, [facility] has six urgent care centers for your convenience:     [facility] Center at [facility] Directions [phone] [address] [place], [place] [address]    [facility] Health Urgent Care Center [facility]) Get Driving Directions [phone] [address] [place], [place] [address]  [facility] Urgent Care Center [facility]) Get Driving Directions [phone] [address] [place],  [place]  [address]  [facility] Health Urgent Care at [facility] Get Driving Directions [phone] [address] [place], [place] [address]   [facility] Health Urgent Care at [facility] Get Driving Directions  [phone] [address] [place], [place] [address]   [facility] Health Urgent Care at [facility] Directions [phone] [address] [place], [place] [address]  Your MyChart E-visit questionnaire answers were reviewed by a board certified advanced clinical practitioner to complete your personal care plan based on your specific symptoms.  Thank you for using e-Visits.      I provided 6 minutes of non face-to-face time during this encounter for chart review, medication and order  placement, as well as and documentation.

## 2020-10-06 NOTE — ED Provider Notes (Signed)
[facility]    CSN: [id number] Arrival date & time: 10/06/20  0915      History   Chief Complaint Chief Complaint  Patient presents with   Urinary Tract Infection    HPI [name] is a 50 y.o. male presenting for concerns about possible urinary tract infection.  Patient states that he has had 2 to 3-day history of dysuria, urinary frequency and urgency.  He denies any fever, fatigue, achiness, abdominal or pelvic pain, back pain/flank pain, testicular pain or swelling, genital lesions or rashes, urethral discharge.  Patient is sexually active with his wife only and denies any concern for STIs.  Patient reports history of urinary tract infections.  Patient does have history of diabetes and used to be on antihyperglycemics for that but was able to lose weight and discontinued that.  He states that since the pandemic he has gained weight and now he has had elevated blood sugars.  He has an appointment with his primary care provider in the next month to discuss putting him back on medication.  He says he is trying to make lifestyle changes with weight loss and has lost weight this year.  He has a history of hypertension and obesity as well.  No other complaints.  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 Lovell Sheehan, [facility]   KNEE SURGERY  2006   left knee reconstruction after tibial fracture   LEG SURGERY  1975   leg and foot surgery; right lower leg circa       Home Medications    Prior to Admission medications   Medication Sig Start Date End Date Taking? Authorizing Provider  hydrochlorothiazide (MICROZIDE) 12.5 MG capsule Take 1 capsule (12.5 mg total) by mouth daily. 02/25/20  Yes Malva Limes, MD  irbesartan-hydrochlorothiazide (AVALIDE) 300-12.5 MG tablet TAKE 1 TABLET BY MOUTH DAILY 07/25/20  Yes Malva Limes, MD  loratadine (CLARITIN) 10 MG tablet Take 10 mg by mouth daily.   Yes [provider]  nitrofurantoin, macrocrystal-monohydrate, (MACROBID) 100 MG capsule Take 1 capsule (100 mg total) by mouth 2 (two) times daily for 7 days. 10/06/20 10/13/20 Yes Eusebio Friendly B, PA-C  sertraline (ZOLOFT) 50 MG tablet Take 1 tablet (50 mg total) by mouth daily. 04/26/20  Yes Malva Limes, MD  acetaminophen (TYLENOL) 500 MG tablet Take 500 mg by mouth every 6 (six) hours as needed.    [provider]  ibuprofen (ADVIL) 800 MG tablet TAKE 1 TABLET BY MOUTH TWICE DAILY AS NEEDED 08/27/19   Malva Limes, MD  meclizine (ANTIVERT) 25 MG tablet Take 1 tablet (25 mg total) by mouth 3 (three) times daily as needed for dizziness. 09/15/20   Larae Grooms, NP  ONE TOUCH ULTRA TEST test strip USE UP TO 3 TIMES DAILY AS DIRECTED 01/24/16   Malva Limes, MD  Probiotic Product (PROBIOTIC DAILY PO) Take by mouth.    [provider]  psyllium (REGULOID) 0.52 g capsule Take 0.52 g by mouth daily.    [provider]  traZODone (DESYREL) 50 MG tablet TAKE 2 TO 3 TABLETS BY MOUTH AT BEDTIME Patient not taking: No sig reported 01/07/18 03/10/19  Malva Limes, MD    Family History Family History  Problem Relation Age of Onset   Panic disorder Mother    Diabetes Mother    Alcohol  abuse Father    Pancreatic cancer Father    Stomach cancer Father    Lung cancer Father    Drug abuse Sister    Healthy Brother    Prostate cancer Other    Lung cancer Other    Anxiety disorder Other    Colon cancer Neg Hx    Heart disease Neg Hx     Social History Social History   Tobacco Use   Smoking status: Never   Smokeless tobacco: Never  Vaping Use   Vaping Use: Never used  Substance Use Topics   Alcohol use: No    Alcohol/week: 0.0 standard drinks   Drug use: No     Allergies   Doxycycline, Amlodipine besylate, Augmentin [amoxicillin-pot clavulanate], Cyclobenzaprine, Indomethacin, Keflex  [cephalexin], and Medrol [methylprednisolone]   Review of Systems Review of Systems  Constitutional:  Negative for fatigue and fever.  Gastrointestinal:  Negative for abdominal pain, nausea and vomiting.  Genitourinary:  Positive for dysuria, frequency and urgency. Negative for genital sores, hematuria, penile discharge, penile pain, penile swelling, scrotal swelling and testicular pain.  Musculoskeletal:  Positive for back pain.  Skin:  Negative for rash.  Neurological:  Negative for weakness.    Physical Exam Triage Vital Signs ED Triage Vitals  Enc Vitals Group     BP 10/06/20 0936 131/79     Pulse Rate 10/06/20 0936 (!) 56     Resp 10/06/20 0936 18     Temp 10/06/20 0936 98.7 F (37.1 C)     Temp Source 10/06/20 0936 Oral     SpO2 10/06/20 0936 100 %     Weight --      Height --      Head Circumference --      Peak Flow --      Pain Score 10/06/20 0931 4     Pain Loc --      Pain Edu? --      Excl. in GC? --    No data found.  Updated Vital Signs BP 131/79 (BP Location: Left Arm) Comment (BP Location): large cuff  Pulse (!) 56   Temp 98.7 F (37.1 C) (Oral)   Resp 18   SpO2 100%      Physical Exam Vitals and nursing note reviewed.  Constitutional:      General: He is not in acute distress.    Appearance: Normal appearance. He is  well-developed. He is obese. He is not ill-appearing.  HENT:     Head: Normocephalic and atraumatic.  Eyes:     General: No scleral icterus.    Conjunctiva/sclera: Conjunctivae normal.  Cardiovascular:     Rate and Rhythm: Regular rhythm. Bradycardia present.     Heart sounds: Normal heart sounds.  Pulmonary:     Effort: Pulmonary effort is normal. No respiratory distress.     Breath  sounds: Normal breath sounds.  Abdominal:     Palpations: Abdomen is soft.     Tenderness: There is no abdominal tenderness. There is no right CVA tenderness or left CVA tenderness.  Musculoskeletal:     Cervical back: Neck supple.  Skin:    General: Skin is warm and dry.  Neurological:     General: No focal deficit present.     Mental Status: He is alert. Mental status is at baseline.     Motor: No weakness.     Gait: Gait normal.  Psychiatric:        Mood and Affect: Mood normal.        Behavior: Behavior normal.        Thought Content: Thought content normal.     UC Treatments / Results  Labs (all labs ordered are listed, but only abnormal results are displayed) Labs Reviewed  URINALYSIS, COMPLETE (UACMP) WITH MICROSCOPIC - Abnormal; Notable for the following components:      Result Value   Color, Urine YELLOW (*)    APPearance CLOUDY (*)    Glucose, UA >1,000 (*)    Hgb urine dipstick MODERATE (*)    Protein, ur TRACE (*)    Leukocytes,Ua SMALL (*)    Bacteria, UA MANY (*)    All other components within normal limits  GLUCOSE, CAPILLARY - Abnormal; Notable for the following components:   Glucose-Capillary 270 (*)    All other components within normal limits  URINE CULTURE  CBG MONITORING, ED    EKG   Radiology No results found.  Procedures Procedures (including critical care time)  Medications Ordered in UC Medications - No data to display  Initial Impression / Assessment and Plan / UC Course  I have reviewed the triage vital signs and the nursing notes.  Pertinent  labs & imaging results that were available during my care of the patient were reviewed by me and considered in my medical decision making (see chart for details).  50 year old male presenting for 2 to 3-day history of dysuria, urinary frequency and urgency.  Urinalysis performed today shows cloudy urine with greater than 1000 glucose, moderate hemoglobin, trace protein, and small leukocytes.  We will send urine for culture and treat him for suspected UTI at this time.  Treating with Macrobid.  Since he does have greater than 1000 glucose, advised to check his blood glucose level.  Patient agrees.  He states that he ate 2 hours ago.  Result is 270.  Discussed this with patient.  He does have an appointment with his PCP in a month.  Advised to continue with lifestyle changes and follow-up with PCP regarding this.   Final Clinical Impressions(s) / UC Diagnoses   Final diagnoses:  Acute cystitis with hematuria  Dysuria  Hyperglycemia     Discharge Instructions      UTI: Based on either symptoms or urinalysis, you may have a urinary tract infection. We will send the urine for culture and call with results in a few days. Begin antibiotics at this time. Your symptoms should be much improved over the next 2-3 days. Increase rest and fluid intake. If for some reason symptoms are worsening or not improving after a couple of days or the urine culture determines the antibiotics you are taking will not treat the infection, the antibiotics may be changed. Return or go to ER for fever, back pain, worsening urinary pain, discharge, increased blood in urine. May take Tylenol or Motrin OTC for pain relief  or consider AZO if no contraindications   Your blood sugar was elevated today at 270. Please continue with lifestyle modifications and follow-up with your PCP as you will likely need to cut back on antihyperglycemic medications.   ED Prescriptions     Medication Sig Dispense Auth. Provider    nitrofurantoin, macrocrystal-monohydrate, (MACROBID) 100 MG capsule Take 1 capsule (100 mg total) by mouth 2 (two) times daily for 7 days. 14 capsule Shirlee Latch, PA-C      PDMP not reviewed this encounter.   Shirlee Latch, PA-C 10/06/20 1102

## 2020-10-08 ENCOUNTER — Ambulatory Visit: Payer: Self-pay | Admitting: *Deleted

## 2020-10-08 NOTE — Telephone Encounter (Signed)
C/o medication side effects from macrobid. Went to [place]- UC for cystits and has been taking macrobid x 3 days and noted headaches and dizziness approx.. 3 hours after taking medication. Denies difficulty breathing. Was prescribed "omnicef" in the past and would like to know if he should continue medication and bare through headaches or start another medication. Care advise given. Patient verbalized understanding of care advise and to call back or go to [facility] or ED or call poison control if symptoms worsen. Please advise

## 2020-10-08 NOTE — Telephone Encounter (Signed)
Reason for Disposition  [1] DOUBLE DOSE (an extra dose or lesser amount) of prescription drug AND [2] NO symptoms (Exception: a double dose of antibiotics)  [1] Caller has medicine question about med NOT prescribed by PCP AND [2] triager unable to answer question (e.g., compatibility with other med, storage)  Answer Assessment - Initial Assessment Questions 1. NAME of MEDICATION: "What medicine are you calling about?"     Macrobid  2. QUESTION: "What is your question?" (e.g., double dose of medicine, side effect)     Thinks med causing side effects of headache and dizziness. Should he continue to take medication or be prescribed another medication. 3. PRESCRIBING HCP: "Who prescribed it?" Reason: if prescribed by specialist, call should be referred to that group.     Sharol Given, PA from [facility] - UC  4. SYMPTOMS: "Do you have any symptoms?"     Headache and dizziness 3 hours after taking medication 5. SEVERITY: If symptoms are present, ask "Are they mild, moderate or severe?"     Not severe not mild  6. PREGNANCY:  "Is there any chance that you are pregnant?" "When was your last menstrual period?"     na  Protocols used: Poisoning-A-AH, Medication Question Call-A-AH

## 2020-10-09 ENCOUNTER — Other Ambulatory Visit: Payer: Self-pay

## 2020-10-09 ENCOUNTER — Emergency Department: Payer: Managed Care, Other (non HMO)

## 2020-10-09 ENCOUNTER — Emergency Department
Admission: EM | Admit: 2020-10-09 | Discharge: 2020-10-09 | Disposition: A | Discharge status: Home / Self Care | Payer: Managed Care, Other (non HMO) | Attending: Emergency Medicine | Admitting: Emergency Medicine

## 2020-10-09 DIAGNOSIS — I10 Essential (primary) hypertension: Secondary | ICD-10-CM | POA: Diagnosis not present

## 2020-10-09 DIAGNOSIS — Z79899 Other long term (current) drug therapy: Secondary | ICD-10-CM | POA: Diagnosis not present

## 2020-10-09 DIAGNOSIS — R112 Nausea with vomiting, unspecified: Secondary | ICD-10-CM | POA: Diagnosis present

## 2020-10-09 DIAGNOSIS — N3090 Cystitis, unspecified without hematuria: Secondary | ICD-10-CM | POA: Diagnosis not present

## 2020-10-09 DIAGNOSIS — E119 Type 2 diabetes mellitus without complications: Secondary | ICD-10-CM | POA: Diagnosis not present

## 2020-10-09 DIAGNOSIS — N12 Tubulo-interstitial nephritis, not specified as acute or chronic: Secondary | ICD-10-CM

## 2020-10-09 HISTORY — DX: Other specified congenital deformities of feet: Q66.89

## 2020-10-09 LAB — COMPREHENSIVE METABOLIC PANEL
ALT: 25 U/L (ref 0–44)
AST: 20 U/L (ref 15–41)
Albumin: 4.1 g/dL (ref 3.5–5.0)
Alkaline Phosphatase: 45 U/L (ref 38–126)
Anion gap: 11 (ref 5–15)
BUN: 14 mg/dL (ref 6–20)
CO2: 24 mmol/L (ref 22–32)
Calcium: 9 mg/dL (ref 8.9–10.3)
Chloride: 100 mmol/L (ref 98–111)
Creatinine, Ser: 0.92 mg/dL (ref 0.61–1.24)
GFR, Estimated: >60 mL/min (ref >60)
Glucose, Bld: 273 mg/dL — ABNORMAL HIGH (ref 70–99)
Potassium: 4.1 mmol/L (ref 3.5–5.1)
Sodium: 135 mmol/L (ref 135–145)
Total Bilirubin: 1.6 mg/dL — ABNORMAL HIGH (ref 0.3–1.2)
Total Protein: 7.6 g/dL (ref 6.5–8.1)

## 2020-10-09 LAB — CBC WITH DIFFERENTIAL/PLATELET
Abs Immature Granulocytes: 0.03 10*3/uL (ref 0.00–0.07)
Basophils Absolute: 0.1 10*3/uL (ref 0.0–0.1)
Basophils Relative: 1 %
Eosinophils Absolute: 0 10*3/uL (ref 0.0–0.5)
Eosinophils Relative: 0 %
HCT: 40.6 % (ref 39.0–52.0)
Hemoglobin: 14.2 g/dL (ref 13.0–17.0)
Immature Granulocytes: 0 %
Lymphocytes Relative: 6 %
Lymphs Abs: 0.5 10*3/uL — ABNORMAL LOW (ref 0.7–4.0)
MCH: 31.3 pg (ref 26.0–34.0)
MCHC: 35 g/dL (ref 30.0–36.0)
MCV: 89.6 fL (ref 80.0–100.0)
Monocytes Absolute: 0.4 10*3/uL (ref 0.1–1.0)
Monocytes Relative: 6 %
Neutro Abs: 6.8 10*3/uL (ref 1.7–7.7)
Neutrophils Relative %: 87 %
Platelets: 171 10*3/uL (ref 150–400)
RBC: 4.53 MIL/uL (ref 4.22–5.81)
RDW: 12.8 % (ref 11.5–15.5)
WBC: 7.8 10*3/uL (ref 4.0–10.5)
nRBC: 0 % (ref 0.0–0.2)

## 2020-10-09 LAB — URINALYSIS, COMPLETE (UACMP) WITH MICROSCOPIC
Bilirubin Urine: NEGATIVE
Glucose, UA: 150 mg/dL — AB
Ketones, ur: NEGATIVE mg/dL
Nitrite: POSITIVE — AB
Protein, ur: 100 mg/dL — AB
Specific Gravity, Urine: 1.027 (ref 1.005–1.030)
WBC, UA: >50 WBC/hpf — ABNORMAL HIGH (ref 0–5)
pH: 8 (ref 5.0–8.0)

## 2020-10-09 LAB — URINE CULTURE: Culture: >=100000 — AB

## 2020-10-09 LAB — LACTIC ACID, PLASMA: Lactic Acid, Venous: 1.2 mmol/L (ref 0.5–1.9)

## 2020-10-09 MED ORDER — SODIUM CHLORIDE 0.9 % IV SOLN
1.0000 g | Freq: Once | INTRAVENOUS | Status: AC
  Administered 2020-10-09: 1 g via INTRAVENOUS
  Filled 2020-10-09: qty 10

## 2020-10-09 MED ORDER — ONDANSETRON HCL 4 MG/2ML IJ SOLN
4.0000 mg | Freq: Once | INTRAMUSCULAR | Status: AC
  Administered 2020-10-09: 4 mg via INTRAVENOUS
  Filled 2020-10-09: qty 2

## 2020-10-09 MED ORDER — ACETAMINOPHEN 325 MG PO TABS
ORAL_TABLET | ORAL | Status: AC
  Administered 2020-10-09: 650 mg via ORAL
  Filled 2020-10-09: qty 2

## 2020-10-09 MED ORDER — KETOROLAC TROMETHAMINE 30 MG/ML IJ SOLN
15.0000 mg | Freq: Once | INTRAMUSCULAR | Status: AC
  Administered 2020-10-09: 15 mg via INTRAVENOUS
  Filled 2020-10-09: qty 1

## 2020-10-09 MED ORDER — CEFDINIR 300 MG PO CAPS: 300.0000 mg | ORAL_CAPSULE | Freq: Two times a day (BID) | ORAL | 0 refills | Status: AC

## 2020-10-09 MED ORDER — ACETAMINOPHEN 325 MG PO TABS
650.0000 mg | ORAL_TABLET | Freq: Once | ORAL | Status: AC | PRN
  Filled 2020-10-09: qty 2

## 2020-10-09 MED ORDER — LACTATED RINGERS IV BOLUS
1000.0000 mL | Freq: Once | INTRAVENOUS | Status: AC
  Administered 2020-10-09: 1000 mL via INTRAVENOUS

## 2020-10-09 MED ORDER — ONDANSETRON 4 MG PO TBDP: 4.0000 mg | ORAL_TABLET | Freq: Three times a day (TID) | ORAL | 0 refills | Status: DC | PRN

## 2020-10-09 NOTE — Discharge Instructions (Addendum)
Use Tylenol for pain and fevers.  Up to 1000 mg per dose, up to 4 times per day.  Do not take more than 4000 mg of Tylenol/acetaminophen within 24 hours..  Use naproxen/Aleve for anti-inflammatory pain relief. Use up to 500mg  every 12 hours. Do not take more frequently than this. Do not use other NSAIDs (ibuprofen, Advil) while taking this medication. It is safe to take Tylenol with this.   The bacteria in your urine culture was Proteus Mirabilis.  Finish your 7-day course of Omnicef/cefdinir, even if your symptoms are improving. You Zofran as needed for any further nausea or vomiting.

## 2020-10-09 NOTE — ED Provider Notes (Signed)
[facility] Emergency Department Provider Note ____________________________________________   Event Date/Time   First MD Initiated Contact with Patient 10/09/20 [phone]     (approximate)  I have reviewed the triage vital signs and the nursing notes.  HISTORY  Chief Complaint Nausea and Emesis   HPI [name] is a 50 y.o. malewho presents to the ED for evaluation of fever, nausea  Chart review indicates history of obesity and diabetes. He was seen at a local urgent care 3 days ago for symptoms of dysuria, frequency and urgency, diagnosed with acute cystitis and placed on Macrobid.  I reviewed the associated urine culture, with Proteus mirabilis, resistant to Macrobid but otherwise pansensitive.  Patient presents to the ED via POV for evaluation of worsening symptoms over the past 3 days despite adherence to his nitrofurantoin.  He reports intermittent lightheaded dizziness and presyncope without syncope.  Reports nausea and one episode of nonbloody nonbilious emesis last night.  He reports continued dysuria and developing fever over the past 2 days.  Denies any history of nephrolithiasis.  Denies diarrhea, cough, shortness of breath, chest pain or syncope.  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 Lovell Sheehan, [facility]   KNEE SURGERY  2006   left knee reconstruction after tibial fracture   LEG SURGERY  1975   leg and foot surgery; right lower leg circa    Prior to Admission medications   Medication Sig Start Date End Date Taking? Authorizing Provider  cefdinir (OMNICEF) 300 MG capsule Take 1 capsule (300 mg total) by mouth 2 (two) times daily for 7 days. 10/09/20 10/16/20 Yes Delton Prairie, MD  ondansetron (ZOFRAN ODT) 4 MG disintegrating tablet Take 1 tablet (4 mg total) by mouth every 8 (eight) hours as needed for nausea or vomiting. 10/09/20  Yes Delton Prairie, MD  acetaminophen (TYLENOL) 500 MG tablet Take 500 mg by mouth every 6 (six) hours as needed.    [provider]  hydrochlorothiazide (MICROZIDE) 12.5 MG capsule Take 1 capsule (12.5 mg total) by mouth daily. 02/25/20   Malva Limes, MD  ibuprofen (ADVIL) 800 MG tablet TAKE 1 TABLET BY MOUTH TWICE DAILY AS NEEDED 08/27/19   Malva Limes, MD  irbesartan-hydrochlorothiazide (AVALIDE) 300-12.5 MG tablet TAKE 1 TABLET BY MOUTH DAILY 07/25/20   Malva Limes, MD  loratadine (CLARITIN) 10 MG tablet Take 10 mg by mouth daily.    [provider]  meclizine (ANTIVERT) 25 MG tablet Take 1 tablet (25 mg total) by mouth 3 (three) times daily as needed for dizziness. 09/15/20   Larae Grooms, NP  nitrofurantoin, macrocrystal-monohydrate, (MACROBID) 100 MG capsule Take 1  capsule (100 mg total) by mouth 2 (two) times daily for 7 days. 10/06/20 10/13/20  Eusebio FriendlyEaves, Lesley B, PA-C  ONE TOUCH ULTRA TEST test strip USE UP TO 3 TIMES DAILY AS DIRECTED 01/24/16   Malva LimesFisher, Donald E, MD  Probiotic Product (PROBIOTIC DAILY PO) Take by mouth.    [provider]  psyllium (REGULOID) 0.52 g capsule Take 0.52 g by mouth daily.    [provider]  sertraline (ZOLOFT) 50 MG tablet  Take 1 tablet (50 mg total) by mouth daily. 04/26/20   Malva LimesFisher, Donald E, MD  traZODone (DESYREL) 50 MG tablet TAKE 2 TO 3 TABLETS BY MOUTH AT BEDTIME Patient not taking: No sig reported 01/07/18 03/10/19  Malva LimesFisher, Donald E, MD    Allergies Doxycycline, Amlodipine besylate, Augmentin [amoxicillin-pot clavulanate], Cyclobenzaprine, Indomethacin, Keflex  [cephalexin], and Medrol [methylprednisolone]  Family History  Problem Relation Age of Onset   Panic disorder Mother    Diabetes Mother    Alcohol abuse Father    Pancreatic cancer Father    Stomach cancer Father    Lung cancer Father    Drug abuse Sister    Healthy Brother    Prostate cancer Other    Lung cancer Other    Anxiety disorder Other    Colon cancer Neg Hx    Heart disease Neg Hx     Social History Social History   Tobacco Use   Smoking status: Never   Smokeless tobacco: Never  Vaping Use   Vaping Use: Never used  Substance Use Topics   Alcohol use: No    Alcohol/week: 0.0 standard drinks   Drug use: No    Review of Systems  Constitutional: Positive for fever/chills Eyes: No visual changes. ENT: No sore throat. Cardiovascular: Denies chest pain. Respiratory: Denies shortness of breath. Gastrointestinal:  No diarrhea.  No constipation. Positive for nausea and vomiting Genitourinary: Positive for for dysuria. Musculoskeletal: Negative for back pain. Skin: Negative for rash. Neurological: Negative for headaches, focal weakness or numbness.  ____________________________________________   PHYSICAL EXAM:  VITAL SIGNS: Vitals:   10/09/20 1130 10/09/20 1200  BP: 120/68 108/64  Pulse: (!) 53 (!) 50  Resp: 16   Temp:  98.7 F (37.1 C)  SpO2: 97% 97%    Constitutional: Alert and oriented. Well appearing and in no acute distress.  Obese, well-appearing without distress.  Ambulatory in the room.  Conversational full sentences. Eyes: Conjunctivae are normal. PERRL. EOMI. Head: Atraumatic. Nose: No  congestion/rhinnorhea. Mouth/Throat: Mucous membranes are moist.  Oropharynx non-erythematous. Neck: No stridor. No cervical spine tenderness to palpation. Cardiovascular: Normal rate, regular rhythm. Grossly normal heart sounds.  Good peripheral circulation. Respiratory: Normal respiratory effort.  No retractions. Lungs CTAB. Gastrointestinal: Soft , nondistended.  Remarkably ticklish, making abdominal exam difficult.  No peritoneal features are noted. Musculoskeletal: No lower extremity tenderness nor edema.  No joint effusions. No signs of acute trauma. Neurologic:  Normal speech and language. No gross focal neurologic deficits are appreciated. No gait instability noted. Skin:  Skin is warm, dry and intact. No rash noted. Psychiatric: Mood and affect are normal. Speech and behavior are normal. ____________________________________________   LABS (all labs ordered are listed, but only abnormal results are displayed)  Labs Reviewed  COMPREHENSIVE METABOLIC PANEL - Abnormal; Notable for the following components:      Result Value   Glucose, Bld 273 (*)    Total Bilirubin 1.6 (*)    All other components within normal limits  CBC WITH DIFFERENTIAL/PLATELET - Abnormal;  Notable for the following components:   Lymphs Abs 0.5 (*)    All other components within normal limits  URINALYSIS, COMPLETE (UACMP) WITH MICROSCOPIC - Abnormal; Notable for the following components:   Color, Urine YELLOW (*)    APPearance HAZY (*)    Glucose, UA 150 (*)    Hgb urine dipstick SMALL (*)    Protein, ur 100 (*)    Nitrite POSITIVE (*)    Leukocytes,Ua SMALL (*)    WBC, UA >50 (*)    Bacteria, UA MANY (*)    All other components within normal limits  CULTURE, BLOOD (ROUTINE X 2)  CULTURE, BLOOD (ROUTINE X 2)  LACTIC ACID, PLASMA  LACTIC ACID, PLASMA   ____________________________________________  12 Lead EKG   ____________________________________________  RADIOLOGY  ED MD interpretation: CT  renal study reviewed by me without evidence of urologic obstruction.  Official radiology report(s): CT Renal Stone Study  Result Date: 10/09/2020 CLINICAL DATA:  50 year old male on antibiotics for UTI. Increasing fever. Vomiting chills, headache and dizziness. EXAM: CT ABDOMEN AND PELVIS WITHOUT CONTRAST TECHNIQUE: Multidetector CT imaging of the abdomen and pelvis was performed following the standard protocol without IV contrast. COMPARISON:  CT Abdomen and Pelvis without contrast 07/01/2014. FINDINGS: Lower chest: Negative lung bases, stable chronic elevation of the right hemidiaphragm. No pericardial or pleural effusion. Hepatobiliary: Negative noncontrast liver and gallbladder. Pancreas: Negative. Spleen: Negative. Adrenals/Urinary Tract: Normal adrenal glands. No hydronephrosis or hydroureter. Ureters appear decompressed and within normal limits to the bladder. No nephrolithiasis. No convincing acute perinephric inflammation. There is probably a small chronic parapelvic renal cyst in the left lower pole. The bladder appears inflamed on series 2, image 99 and is relatively diminutive. Occasional pelvic phleboliths, but no convincing ureteral calculus. No gas within the bladder. Stomach/Bowel: Negative large bowel. Normal appendix on series 2, image 72. Small bowel is nondilated and appears negative. There is partially calcified oval 2.5 cm soft tissue nodule in the distal omentum which appears to be chronic and postinflammatory, not identified in 2016. No free air, free fluid or acute mesenteric inflammation. Stomach and duodenum are decompressed. Vascular/Lymphatic: Normal caliber abdominal aorta. No calcified atherosclerosis. No lymphadenopathy. Reproductive: Seminal vesicles appear indistinct and possibly inflamed on series 2, image 99. No obvious prostate heterogeneity. Other: No pelvic free fluid. Musculoskeletal: Advanced chronic lumbar spine degeneration. Bulky chronic lower thoracic disc osteophyte  complex. Multilevel associated spinal stenosis. No acute osseous abnormality identified. Benign T7 vertebral body hemangioma appears stable and negative. IMPRESSION: 1. Urinary bladder and seminal vesicles appear inflamed compatible with Urinary/Genital Tract Infection. No abscess or strong evidence of prostatitis on this non-contrast exam. 2. Nonobstructed kidneys with no strong evidence of pyelonephritis on this non-contrast exam. 3. Advanced thoracolumbar spine degeneration with multilevel spinal stenosis. Electronically Signed   By: Odessa Fleming M.D.   On: 10/09/2020 10:30    ____________________________________________   PROCEDURES and INTERVENTIONS  Procedure(s) performed (including Critical Care):  .1-3 Lead EKG Interpretation  Date/Time: 10/09/2020 10:37 AM Performed by: Delton Prairie, MD Authorized by: Delton Prairie, MD     Interpretation: normal     ECG rate:  78   ECG rate assessment: normal     Rhythm: sinus rhythm     Ectopy: none     Conduction: normal    Medications  acetaminophen (TYLENOL) tablet 650 mg (650 mg Oral Given 10/09/20 0723)  lactated ringers bolus 1,000 mL (0 mLs Intravenous Stopped 10/09/20 1149)  cefTRIAXone (ROCEPHIN) 1 g in sodium chloride 0.9 %  100 mL IVPB (0 g Intravenous Stopped 10/09/20 1043)  ondansetron (ZOFRAN) injection 4 mg (4 mg Intravenous Given 10/09/20 0957)  ketorolac (TORADOL) 30 MG/ML injection 15 mg (15 mg Intravenous Given 10/09/20 1042)    ____________________________________________   MDM / ED COURSE   Obese and diabetic patient presents to the ED with stigmata of pyelonephritis in the setting of acute cystitis resistant to Macrobid he was previously prescribed, ultimately amenable to outpatient management with different antibiotics.  He presents febrile, but remains hemodynamically stable without tachycardia throughout his stay in the ED.  Blood work with hyperglycemia without acidosis, no leukocytosis or lactic acidosis.  Urine is  infectious.  I reviewed urine culture results from a few days ago with Proteus resistant to nitrofurantoin, but otherwise pansensitive.  CT today without evidence of urologic obstruction.  Provided Rocephin, IV fluids and symptomatic measures with defervescence and improvement of his symptoms.  He looks clinically well after all of this and is requesting outpatient management, which I think is reasonable, considering how well he looks and good response to medications.  Transitioned him to cefdinir, as he reports good tolerance of this in the past, but intolerance of cephalexin previously.  We discussed return precautions for the ED and patient is suitable for outpatient management.  Clinical Course as of 10/09/20 1314  Sat Oct 09, 2020  1024 Discussed diagnosis of pyelonephritis and plan of care with the patient.  We discussed the possibility of outpatient management, and he is eager for this as he reports feeling much better prior to our full resuscitation in the ED.  We discussed IV antibiotics, at time of observation in the ED for the next couple hours and then likely outpatient management.  He is agreeable. [DS]  1233 reassessed [DS]    Clinical Course User Index [DS] Delton Prairie, MD    ____________________________________________   FINAL CLINICAL IMPRESSION(S) / ED DIAGNOSES  Final diagnoses:  Pyelonephritis     ED Discharge Orders          Ordered    cefdinir (OMNICEF) 300 MG capsule  2 times daily        10/09/20 1223    ondansetron (ZOFRAN ODT) 4 MG disintegrating tablet  Every 8 hours PRN        10/09/20 1223             Willadene Mounsey   Note:  This document was prepared using Dragon voice recognition software and may include unintentional dictation errors.    Delton Prairie, MD 10/09/20 1318

## 2020-10-09 NOTE — ED Triage Notes (Signed)
Pt reports he is taking Macrobid for current UTI prescribed at Urgent Care on Wednesday. He states on Thursday he developed fevers, nausea, vomiting, chills,headache, dizziness. Temp 101.8 in triage. He self tested for covid last night and states it was negative.

## 2020-10-11 MED ORDER — CEFDINIR 300 MG PO CAPS: 300.0000 mg | ORAL_CAPSULE | Freq: Two times a day (BID) | ORAL | 0 refills | Status: AC

## 2020-10-11 NOTE — Telephone Encounter (Signed)
Left message to call back. Ok for [facility] to advise if patient calls back.  

## 2020-10-11 NOTE — Addendum Note (Signed)
Addended by: Malva Limes on: 10/11/2020 10:46 AM   Modules accepted: Orders

## 2020-10-11 NOTE — Telephone Encounter (Signed)
Patient reports that he ended up going to the ER on 10/09/20. He reports that they changed his Rx to cefdinir and was given a 7 day supply. He reports that it was recommended that he be seen right after he is finished with the prescription. I recommended that he be seen on 10/25/20 at 10am for a ER FU, and patient feels that it is too long to wait. He is requesting abx to last him until he is seen by you. He does not want a "relapse" of pyonephritis.  Is this appropriate? Please advise. Thanks!

## 2020-10-11 NOTE — Telephone Encounter (Signed)
Have sent prescription for additional 7 days of cefdinir

## 2020-10-14 LAB — CULTURE, BLOOD (ROUTINE X 2)
Culture: NO GROWTH
Culture: NO GROWTH
Special Requests: ADEQUATE
Special Requests: ADEQUATE

## 2020-10-25 ENCOUNTER — Ambulatory Visit: Payer: Managed Care, Other (non HMO) | Admitting: Family Medicine

## 2020-10-25 ENCOUNTER — Other Ambulatory Visit: Payer: Self-pay

## 2020-10-25 ENCOUNTER — Encounter: Payer: Self-pay | Admitting: Family Medicine

## 2020-10-25 VITALS — BP 130/86 | HR 58 | Temp 97.8°F | Resp 16

## 2020-10-25 DIAGNOSIS — N12 Tubulo-interstitial nephritis, not specified as acute or chronic: Secondary | ICD-10-CM

## 2020-10-25 DIAGNOSIS — E119 Type 2 diabetes mellitus without complications: Secondary | ICD-10-CM | POA: Diagnosis not present

## 2020-10-25 LAB — POCT URINALYSIS DIPSTICK
Bilirubin, UA: NEGATIVE
Glucose, UA: POSITIVE — AB
Ketones, UA: NEGATIVE
Leukocytes, UA: NEGATIVE
Nitrite, UA: NEGATIVE
Protein, UA: NEGATIVE
Spec Grav, UA: 1.025 (ref 1.010–1.025)
Urobilinogen, UA: 0.2 E.U./dL
pH, UA: 6 (ref 5.0–8.0)

## 2020-10-25 MED ORDER — METFORMIN HCL ER 500 MG PO TB24: ORAL_TABLET | ORAL | 1 refills | Status: DC

## 2020-10-25 NOTE — Progress Notes (Deleted)
      Established patient visit   Patient: [name]   DOB: [birth year]   50 y.o. Male  MRN: [id number] Visit Date: 10/25/2020  Today's healthcare provider: [name], MD   No chief complaint on file.  Subjective  -------------------------------------------------------------------------------------------------------------------- HPI  Follow up ER visit  Patient was seen in ER for acute cystitis with hematuria, dysuria and hyperglycemia on 10/06/2020. He was prescribed Macrobid. He returned to the ER on 10/09/2020 and was treated for pyelonephritis.  Treatment for this included Rocephin and IV fluids. Patient was transitioned to cefdinir. He reports {DESC; EXCELLENT/GOOD/FAIR:19992} compliance with treatment. He reports this condition is {improved/worse/unchanged:3041574}.  -----------------------------------------------------------------------------------------   {Show patient history (optional):23778}   Medications: Outpatient Medications Prior to Visit  Medication Sig   acetaminophen (TYLENOL) 500 MG tablet Take 500 mg by mouth every 6 (six) hours as needed.   hydrochlorothiazide (MICROZIDE) 12.5 MG capsule Take 1 capsule (12.5 mg total) by mouth daily.   ibuprofen (ADVIL) 800 MG tablet TAKE 1 TABLET BY MOUTH TWICE DAILY AS NEEDED   irbesartan-hydrochlorothiazide (AVALIDE) 300-12.5 MG tablet TAKE 1 TABLET BY MOUTH DAILY   loratadine (CLARITIN) 10 MG tablet Take 10 mg by mouth daily.   meclizine (ANTIVERT) 25 MG tablet Take 1 tablet (25 mg total) by mouth 3 (three) times daily as needed for dizziness.   ondansetron (ZOFRAN ODT) 4 MG disintegrating tablet Take 1 tablet (4 mg total) by mouth every 8 (eight) hours as needed for nausea or vomiting.   ONE TOUCH ULTRA TEST test strip USE UP TO 3 TIMES DAILY AS DIRECTED   Probiotic Product (PROBIOTIC DAILY PO) Take by mouth.   psyllium (REGULOID) 0.52 g capsule Take 0.52 g by mouth daily.   sertraline (ZOLOFT) 50 MG tablet Take  1 tablet (50 mg total) by mouth daily.   No facility-administered medications prior to visit.    Review of Systems  {Labs  Heme  Chem  Endocrine  Serology  Results Review (optional):23779}   Objective  -------------------------------------------------------------------------------------------------------------------- There were no vitals taken for this visit. {Show previous vital signs (optional):23777}   Physical Exam  ***  No results found for any visits on 10/25/20.  Assessment & Plan  ---------------------------------------------------------------------------------------------------------------------- ***  No follow-ups on file.      {provider attestation***:1}   [name], MD  [facility] [phone] (phone) [phone] (fax)  [facility]

## 2020-10-25 NOTE — Progress Notes (Deleted)
      Established patient visit   Patient: [name]   DOB: 10/12/1970   50 y.o. Male  MRN: [id number] Visit Date: 10/25/2020  Today's healthcare provider: [name], MD   Chief Complaint  Patient presents with   Follow-up   Subjective  -------------------------------------------------------------------------------------------------------------------- HPI  Follow up ER visit   Patient was seen in ER for acute cystitis with hematuria, dysuria and hyperglycemia on 10/06/2020. He was prescribed Macrobid. He returned to the ER on 10/09/2020 and was treated for pyelonephritis.  Treatment for this included Rocephin and IV fluids. Patient was transitioned to cefdinir. He reports good compliance with treatment. He reports this condition is Improved.     Medications: Outpatient Medications Prior to Visit  Medication Sig   acetaminophen (TYLENOL) 500 MG tablet Take 500 mg by mouth every 6 (six) hours as needed.   hydrochlorothiazide (MICROZIDE) 12.5 MG capsule Take 1 capsule (12.5 mg total) by mouth daily.   ibuprofen (ADVIL) 800 MG tablet TAKE 1 TABLET BY MOUTH TWICE DAILY AS NEEDED   irbesartan-hydrochlorothiazide (AVALIDE) 300-12.5 MG tablet TAKE 1 TABLET BY MOUTH DAILY   loratadine (CLARITIN) 10 MG tablet Take 10 mg by mouth daily.   ONE TOUCH ULTRA TEST test strip USE UP TO 3 TIMES DAILY AS DIRECTED   Probiotic Product (PROBIOTIC DAILY PO) Take by mouth.   psyllium (REGULOID) 0.52 g capsule Take 0.52 g by mouth daily.   sertraline (ZOLOFT) 50 MG tablet Take 1 tablet (50 mg total) by mouth daily.   meclizine (ANTIVERT) 25 MG tablet Take 1 tablet (25 mg total) by mouth 3 (three) times daily as needed for dizziness. (Patient not taking: Reported on 10/25/2020)   ondansetron (ZOFRAN ODT) 4 MG disintegrating tablet Take 1 tablet (4 mg total) by mouth every 8 (eight) hours as needed for nausea or vomiting. (Patient not taking: Reported on 10/25/2020)   No facility-administered  medications prior to visit.    Review of Systems  Constitutional:  Negative for appetite change, chills and fever.  Respiratory:  Negative for chest tightness, shortness of breath and wheezing.   Cardiovascular:  Negative for chest pain and palpitations.  Gastrointestinal:  Negative for abdominal pain, nausea and vomiting.    Objective  -------------------------------------------------------------------------------------------------------------------- BP 130/86 (BP Location: Left Arm, Patient Position: Sitting, Cuff Size: Large)   Pulse (!) 58   Temp 97.8 F (36.6 C) (Temporal)   Resp 16  Physical Exam  ***  Results for orders placed or performed in visit on 10/25/20  POCT Urinalysis Dipstick  Result Value Ref Range   Color, UA yellow    Clarity, UA clear    Glucose, UA Positive (A) Negative   Bilirubin, UA negative    Ketones, UA negative    Spec Grav, UA 1.025 1.010 - 1.025   Blood, UA trace (non-hemolyzed)    pH, UA 6.0 5.0 - 8.0   Protein, UA Negative Negative   Urobilinogen, UA 0.2 0.2 or 1.0 E.U./dL   Nitrite, UA negative    Leukocytes, UA Negative Negative   Appearance     Odor      Assessment & Plan  ---------------------------------------------------------------------------------------------------------------------- ***  No follow-ups on file.      {provider attestation***:1}   [name], MD  [facility] [phone] (phone) [phone] (fax)  [facility]

## 2020-10-25 NOTE — Progress Notes (Signed)
Established patient visit   Patient: [name]   DOB: [birth year]   50 y.o. Male  MRN: [id number] Visit Date: 10/25/2020  Today's healthcare provider: [name], MD   Chief Complaint  Patient presents with   Follow-up   Subjective  -------------------------------------------------------------------------------------------------------------------- HPI  Patient was seen in ER for acute cystitis with hematuria, dysuria and hyperglycemia on 10/06/2020. He was prescribed Macrobid. He returned to the ER on 10/09/2020 and was treated for pyelonephritis due to proteus. .  Treatment for this included Rocephin and IV fluids. Patient was transitioned to cefdinir. He reports good compliance with treatment. He reports this condition is resolved. Having no dysuria, back pain, fever, nausea or vomiting.   He has been working on diet and weight loss but continues to have sugars consistently in the upper 100s and would like to start back on metformin which he did well with in the past.   Lab Results  Component Value Date   HGBA1C 7.0 (H) 07/23/2020       Medications: Outpatient Medications Prior to Visit  Medication Sig   acetaminophen (TYLENOL) 500 MG tablet Take 500 mg by mouth every 6 (six) hours as needed.   hydrochlorothiazide (MICROZIDE) 12.5 MG capsule Take 1 capsule (12.5 mg total) by mouth daily.   ibuprofen (ADVIL) 800 MG tablet TAKE 1 TABLET BY MOUTH TWICE DAILY AS NEEDED   irbesartan-hydrochlorothiazide (AVALIDE) 300-12.5 MG tablet TAKE 1 TABLET BY MOUTH DAILY   loratadine (CLARITIN) 10 MG tablet Take 10 mg by mouth daily.   ONE TOUCH ULTRA TEST test strip USE UP TO 3 TIMES DAILY AS DIRECTED   Probiotic Product (PROBIOTIC DAILY PO) Take by mouth.   psyllium (REGULOID) 0.52 g capsule Take 0.52 g by mouth daily.   sertraline (ZOLOFT) 50 MG tablet Take 1 tablet (50 mg total) by mouth daily.   meclizine (ANTIVERT) 25 MG tablet Take 1 tablet (25 mg total) by mouth 3  (three) times daily as needed for dizziness. (Patient not taking: Reported on 10/25/2020)   ondansetron (ZOFRAN ODT) 4 MG disintegrating tablet Take 1 tablet (4 mg total) by mouth every 8 (eight) hours as needed for nausea or vomiting. (Patient not taking: Reported on 10/25/2020)   No facility-administered medications prior to visit.        Objective  -------------------------------------------------------------------------------------------------------------------- BP 130/86 (BP Location: Left Arm, Patient Position: Sitting, Cuff Size: Large)   Pulse (!) 58   Temp 97.8 F (36.6 C) (Temporal)   Resp 16  Physical Exam    Results for orders placed or performed in visit on 10/25/20  POCT Urinalysis Dipstick  Result Value Ref Range   Color, UA yellow    Clarity, UA clear    Glucose, UA Positive (A) Negative   Bilirubin, UA negative    Ketones, UA negative    Spec Grav, UA 1.025 1.010 - 1.025   Blood, UA trace (non-hemolyzed)    pH, UA 6.0 5.0 - 8.0   Protein, UA Negative Negative   Urobilinogen, UA 0.2 0.2 or 1.0 E.U./dL   Nitrite, UA negative    Leukocytes, UA Negative Negative   Appearance     Odor      Assessment & Plan  ----------------------------------------------------------------------------------------------------------------------  1. Pyelonephritis Resolved, completed antibiotic.   2. Type 2 diabetes mellitus without complication, without long-term current use of insulin (HCC) restart- metFORMIN (GLUCOPHAGE-XR) 500 MG 24 hr tablet; TAKE TWO (2) TABLETS BY MOUTH DAILY  Dispense: 180 tablet; Refill: 1  Future Appointments  Date Time Provider Department Center  01/19/2021  8:40 AM Malva Limes, MD [facility]        The entirety of the information documented in the History of Present Illness, Review of Systems and Physical Exam were personally obtained by me. Portions of this information were initially documented by the CMA and reviewed by me for thoroughness  and accuracy.     [name], MD  [facility] [phone] (phone) [phone] (fax)  [facility]

## 2020-11-21 ENCOUNTER — Other Ambulatory Visit: Payer: Self-pay | Admitting: Family Medicine

## 2020-12-14 ENCOUNTER — Ambulatory Visit: Payer: Self-pay | Admitting: *Deleted

## 2020-12-14 NOTE — Telephone Encounter (Signed)
Reason for Disposition  [1] Swollen toe AND [2] no fever  (Exceptions: just localized bump from bunion, corns, insect bite, sting)  Answer Assessment - Initial Assessment Questions 1. ONSET: "When did the pain start?"      yesterday 2. LOCATION: "Where is the pain located?"   (e.g., around nail, entire toe, at foot joint)      Great R toe at joint 3. PAIN: "How bad is the pain?"    (Scale 1-10; or mild, moderate, severe)   -  MILD (1-3): doesn't interfere with normal activities    -  MODERATE (4-7): interferes with normal activities (e.g., work or school) or awakens from sleep, limping    -  SEVERE (8-10): excruciating pain, unable to do any normal activities, unable to walk     moderate 4. APPEARANCE: "What does the toe look like?" (e.g., redness, swelling, bruising, pallor)     Swelling 5. CAUSE: "What do you think is causing the toe pain?"     unsure 6. OTHER SYMPTOMS: "Do you have any other symptoms?" (e.g., leg pain, rash, fever, numbness)     no 7. PREGNANCY: "Is there any chance you are pregnant?" "When was your last menstrual period?"     N/a  Protocols used: Toe Pain-A-AH

## 2020-12-14 NOTE — Telephone Encounter (Signed)
He can have one of the acute visit slots Friday, or go to Urgent Care if gets worse before then.

## 2020-12-14 NOTE — Telephone Encounter (Signed)
Patient is calling to report he has pain and swelling in R great toe that started yesterday. Patient is not aware of any trama to toe and states he does have history of gout- but different foot and the pain is different. Call to office- they are going to check with provider and call patient back.

## 2020-12-15 ENCOUNTER — Other Ambulatory Visit: Payer: Self-pay

## 2020-12-15 ENCOUNTER — Telehealth: Payer: Self-pay

## 2020-12-15 ENCOUNTER — Ambulatory Visit
Admission: RE | Admit: 2020-12-15 | Discharge: 2020-12-15 | Disposition: A | Discharge status: Home / Self Care | Payer: Managed Care, Other (non HMO) | Source: Ambulatory Visit

## 2020-12-15 VITALS — BP 142/84 | HR 58 | Temp 98.4°F

## 2020-12-15 DIAGNOSIS — M79674 Pain in right toe(s): Secondary | ICD-10-CM

## 2020-12-15 MED ORDER — COLCHICINE 0.6 MG PO TABS: ORAL_TABLET | ORAL | 0 refills | Status: DC

## 2020-12-15 NOTE — Discharge Instructions (Addendum)
Take 2 tablets of colchicine.  Wait 1 hour and take another tablet. On days 2 through 5, take 1 tablet.

## 2020-12-15 NOTE — ED Provider Notes (Signed)
[facility]  ____________________________________________  Time seen: Approximately 9:18 AM  I have reviewed the triage vital signs and the nursing notes.   HISTORY  Chief Complaint Toe Pain   Patient     HPI [name] is a 50 y.o. male with a history of gout, presents to the emergency department with right great toe pain for the past 3 days with some mild erythema.  Patient reports that his current symptoms feel similar to gout flares in the past and he has tolerated colchicine well.  No fever.  No prior history of cellulitis.  No calf pain or calf erythema.   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 Lovell Sheehan, [facility]   KNEE SURGERY  2006   left knee reconstruction after tibial fracture   LEG SURGERY  1975   leg and foot surgery; right lower leg circa    Prior to Admission medications    Medication Sig Start Date End Date Taking? Authorizing Provider  acetaminophen (TYLENOL) 500 MG tablet Take 500 mg by mouth every 6 (six) hours as needed.   Yes [provider]  acetaminophen (TYLENOL) 650 MG CR tablet Tylenol Arthritis 650 mg tablet,extended release   2 tablets every day by oral route. 05/28/09  Yes [provider]  colchicine 0.6 MG tablet First day: Take 2 tablets.  Wait 1 hour and take 1 more tablet. Days 2-5. Take one tablet daily. 12/15/20  Yes Pia Mau M, PA-C  hydrochlorothiazide (MICROZIDE) 12.5 MG capsule Take 1 capsule (12.5 mg total) by mouth daily. 02/25/20  Yes Malva Limes, MD  ibuprofen (ADVIL) 800 MG tablet TAKE 1 TABLET BY MOUTH TWICE DAILY AS NEEDED 08/27/19  Yes Malva Limes, MD  irbesartan-hydrochlorothiazide (AVALIDE) 300-12.5 MG tablet TAKE 1 TABLET BY MOUTH DAILY 07/25/20  Yes Malva Limes, MD  loratadine (CLARITIN) 10 MG tablet Take 10 mg by mouth daily.   Yes [provider]  metFORMIN (GLUCOPHAGE-XR) 500 MG 24 hr tablet TAKE TWO (2) TABLETS BY MOUTH DAILY 10/25/20  Yes Malva Limes, MD  Probiotic Product (PROBIOTIC DAILY PO) Take by mouth.   Yes [provider]  psyllium (REGULOID) 0.52 g capsule Take 0.52 g by mouth daily.   Yes [provider]  sertraline (ZOLOFT) 25 MG tablet Take 25 mg by  mouth daily. 07/24/20  Yes [provider]  sertraline (ZOLOFT) 50 MG tablet Take 1 tablet (50 mg total) by mouth daily. 04/26/20  Yes Malva Limes, MD  meclizine (ANTIVERT) 25 MG tablet Take 1 tablet (25 mg total) by mouth 3 (three) times daily as needed for dizziness. Patient not taking: Reported on 10/25/2020 09/15/20   Larae Grooms, NP  ONE TOUCH ULTRA TEST test strip USE UP TO 3 TIMES DAILY AS DIRECTED 01/24/16   Malva Limes, MD  traZODone (DESYREL) 50 MG tablet TAKE 2 TO 3 TABLETS BY MOUTH AT BEDTIME Patient not taking: No sig reported 01/07/18 03/10/19  Malva Limes, MD     Allergies Doxycycline, Amlodipine besylate, Augmentin [amoxicillin-pot clavulanate], Cyclobenzaprine, Indomethacin, Keflex  [cephalexin], and Medrol [methylprednisolone]  Family History  Problem Relation Age of Onset   Panic disorder Mother    Diabetes Mother    Alcohol abuse Father    Pancreatic cancer Father    Stomach cancer Father    Lung cancer Father    Drug abuse Sister    Healthy Brother    Prostate cancer Other    Lung cancer Other    Anxiety disorder Other    Colon cancer Neg Hx    Heart disease Neg Hx     Social History Social History   Tobacco Use   Smoking status: Never   Smokeless tobacco: Never  Vaping Use   Vaping Use: Never used  Substance Use Topics   Alcohol use: No    Alcohol/week: 0.0 standard drinks   Drug use: No     Review of Systems  Constitutional: No fever/chills Eyes:  No discharge ENT: No upper respiratory complaints. Respiratory: no cough. No SOB/ use of accessory muscles to breath Gastrointestinal:   No nausea, no vomiting.  No diarrhea.  No constipation. Musculoskeletal: Negative for musculoskeletal pain. Skin: Patient has right great toe pain.    ____________________________________________   PHYSICAL EXAM:  VITAL SIGNS: ED Triage Vitals  Enc Vitals Group     BP 12/15/20 0855 (!) 142/84     Pulse Rate 12/15/20 0855 (!) 58     Resp --      Temp 12/15/20 0855 98.4 F (36.9 C)     Temp Source 12/15/20 0855 Oral     SpO2 12/15/20 0855 98 %     Weight --      Height --      Head Circumference --      Peak Flow --      Pain Score 12/15/20 0853 5     Pain Loc --      Pain Edu? --      Excl. in GC? --      Constitutional: Alert and oriented. Well appearing and in no acute distress. Eyes: Conjunctivae are normal. PERRL. EOMI. Head: Atraumatic. ENT: Cardiovascular: Normal rate, regular rhythm. Normal S1 and S2.  Good peripheral circulation. Respiratory: Normal respiratory effort without tachypnea or retractions.  Lungs CTAB. Good air entry to the bases with no decreased or absent breath sounds Gastrointestinal: Bowel sounds x 4 quadrants. Soft and nontender to palpation. No guarding or rigidity. No distention. Musculoskeletal: Full range of motion to all extremities. No obvious deformities noted Neurologic:  Normal for age. No gross focal neurologic deficits are appreciated.  Skin: Patient has erythema and pain of the right great toe. Psychiatric: Mood and affect are normal for age. Speech and behavior are normal.   ____________________________________________   LABS (all labs ordered  are listed, but only abnormal results are displayed)  Labs Reviewed - No data to display ____________________________________________  EKG   ____________________________________________  RADIOLOGY   No results found.  ____________________________________________    PROCEDURES  Procedure(s) performed:     Procedures     Medications - No data to display   ____________________________________________   INITIAL IMPRESSION / ASSESSMENT AND PLAN / ED COURSE  Pertinent labs & imaging results that were available during my care of the patient were reviewed by me and considered in my medical decision making (see chart for details).    Assessment and plan Gout 50 year old male presents to the emergency department with right great toe pain for the past 3 days concerning for gout.  Patient was discharged with colchicine.  Return precautions were given to return with new or worsening symptoms.      ____________________________________________  FINAL CLINICAL IMPRESSION(S) / ED DIAGNOSES  Final diagnoses:  Pain of toe of right foot      NEW MEDICATIONS STARTED DURING THIS VISIT:  ED Discharge Orders          Ordered    colchicine 0.6 MG tablet        12/15/20 0908                This chart was dictated using voice recognition software/Dragon. Despite best efforts to  proofread, errors can occur which can change the meaning. Any change was purely unintentional.     Orvil Feil, PA-C 12/15/20 0920

## 2020-12-15 NOTE — ED Triage Notes (Signed)
Pt c/o right great toe pain x 3 days. Pt has a hx of gout. No known injury.

## 2020-12-15 NOTE — Telephone Encounter (Signed)
Tried calling patient. Left message to call back for appointment. Per chart, it looks like patient is being seen at [facility] health urgent care right now.

## 2020-12-15 NOTE — Telephone Encounter (Signed)
Copied from CRM [id number]. Topic: General - Other >> Dec 15, 2020  9:08 AM Wyonia Hough E wrote: Reason for CRM: Pt returned Jamise Pentland's call/ he stated he is at Urgent care and doesn't need the appt at this time/

## 2020-12-19 ENCOUNTER — Encounter: Payer: Self-pay | Admitting: Family Medicine

## 2020-12-20 MED ORDER — PREDNISONE 10 MG PO TABS: ORAL_TABLET | ORAL | 0 refills | Status: AC

## 2021-01-16 ENCOUNTER — Other Ambulatory Visit: Payer: Self-pay | Admitting: Family Medicine

## 2021-01-16 NOTE — Telephone Encounter (Signed)
Requested Prescriptions  Pending Prescriptions Disp Refills  . irbesartan-hydrochlorothiazide (AVALIDE) 300-12.5 MG tablet [Pharmacy Med Name: IRBESARTAN/HCTZ 300-12.5MG  TABLETS] 90 tablet 0    Sig: TAKE 1 TABLET BY MOUTH DAILY     Cardiovascular: ARB + Diuretic Combos Failed - 01/16/2021  8:49 AM      Failed - Last BP in normal range    BP Readings from Last 1 Encounters:  12/15/20 (!) 142/84         Passed - K in normal range and within 180 days    Potassium  Date Value Ref Range Status  10/09/2020 4.1 3.5 - 5.1 mmol/L Final  12/09/2013 3.6 3.5 - 5.1 mmol/L Final         Passed - Na in normal range and within 180 days    Sodium  Date Value Ref Range Status  10/09/2020 135 135 - 145 mmol/L Final  07/23/2020 143 134 - 144 mmol/L Final  12/09/2013 141 136 - 145 mmol/L Final         Passed - Cr in normal range and within 180 days    Creatinine  Date Value Ref Range Status  12/09/2013 1.07 0.60 - 1.30 mg/dL Final   Creatinine, Ser  Date Value Ref Range Status  10/09/2020 0.92 0.61 - 1.24 mg/dL Final   Creatinine, POC  Date Value Ref Range Status  09/15/2015 n/a mg/dL Final         Passed - Ca in normal range and within 180 days    Calcium  Date Value Ref Range Status  10/09/2020 9.0 8.9 - 10.3 mg/dL Final   Calcium, Total  Date Value Ref Range Status  12/09/2013 8.1 (L) 8.5 - 10.1 mg/dL Final         Passed - Patient is not pregnant      Passed - Valid encounter within last 6 months    Recent Outpatient Visits          2 months ago Pyelonephritis   [facility] Malva Limes, MD   4 months ago Dizziness   [facility] Larae Grooms, NP   5 months ago Essential (primary) hypertension   [facility] Malva Limes, MD   1 year ago Annual physical exam   [facility] Malva Limes, MD   2 years ago Annual physical exam   [facility] Malva Limes, MD      Future  Appointments            In 3 days Fisher, Demetrios Isaacs, MD [facility]

## 2021-01-19 ENCOUNTER — Encounter: Payer: Self-pay | Admitting: Family Medicine

## 2021-01-19 ENCOUNTER — Ambulatory Visit: Payer: Managed Care, Other (non HMO) | Admitting: Family Medicine

## 2021-01-19 ENCOUNTER — Other Ambulatory Visit: Payer: Self-pay

## 2021-01-19 VITALS — BP 148/95 | HR 51 | Temp 98.9°F | Resp 18 | Wt 327.0 lb

## 2021-01-19 DIAGNOSIS — I10 Essential (primary) hypertension: Secondary | ICD-10-CM | POA: Diagnosis not present

## 2021-01-19 DIAGNOSIS — E119 Type 2 diabetes mellitus without complications: Secondary | ICD-10-CM | POA: Diagnosis not present

## 2021-01-19 DIAGNOSIS — F413 Other mixed anxiety disorders: Secondary | ICD-10-CM

## 2021-01-19 DIAGNOSIS — E790 Hyperuricemia without signs of inflammatory arthritis and tophaceous disease: Secondary | ICD-10-CM

## 2021-01-19 DIAGNOSIS — Z1211 Encounter for screening for malignant neoplasm of colon: Secondary | ICD-10-CM

## 2021-01-19 LAB — POCT GLYCOSYLATED HEMOGLOBIN (HGB A1C)
Est. average glucose Bld gHb Est-mCnc: 134
Hemoglobin A1C: 6.3 % — AB (ref 4.0–5.6)

## 2021-01-19 NOTE — Patient Instructions (Addendum)
Please contact your eyecare professional to schedule a routine eye exam  . Please review the attached list of medications and notify my office if there are any errors.   . Please bring all of your medications to every appointment so we can make sure that our medication list is the same as yours.   

## 2021-01-19 NOTE — Progress Notes (Signed)
Established patient visit   Patient: [name]   DOB: [birth year]   50 y.o. Male  MRN: [id number] Visit Date: 01/19/2021  Today's healthcare provider: [name], MD   Chief Complaint  Patient presents with   Diabetes   Hypertension   Subjective    HPI  Diabetes Mellitus Type II, Follow-up  Lab Results  Component Value Date   HGBA1C 6.3 (A) 01/19/2021   HGBA1C 7.0 (H) 07/23/2020   HGBA1C 5.3 07/18/2019   Wt Readings from Last 3 Encounters:  01/19/21 (!) 327 lb (148.3 kg)  10/09/20 (!) 333 lb (151 kg)  09/15/20 (!) 333 lb (151 kg)   Last seen for diabetes on 10/25/2020.   Management since then includes restarting- metFORMIN (GLUCOPHAGE-XR) 500 MG 24 hr tablet; TAKE TWO (2) TABLETS BY MOUTH DAILY  . He reports good compliance with treatment. He is not having side effects.  Symptoms: No fatigue No foot ulcerations  No appetite changes No nausea  No paresthesia of the feet  No polydipsia  No polyuria No visual disturbances   No vomiting     Home blood sugar records:  blood sugars are rarely checked at home  Episodes of hypoglycemia? No    Current insulin regiment: none Most Recent Eye Exam: not UTD Current exercise: none Current diet habits: weight watchers  Pertinent Labs: Lab Results  Component Value Date   CHOL 145 07/23/2020   HDL 53 07/23/2020   LDLCALC 78 07/23/2020   TRIG 72 07/23/2020   CHOLHDL 2.7 07/23/2020   Lab Results  Component Value Date   NA 135 10/09/2020   K 4.1 10/09/2020   CREATININE 0.92 10/09/2020   GFRNONAA >60 10/09/2020   MICROALBUR 20 01/12/2017     ---------------------------------------------------------------------------------------------------   Hypertension, follow-up  BP Readings from Last 3 Encounters:  01/19/21 (!) 148/95  12/15/20 (!) 142/84  10/25/20 130/86   Wt Readings from Last 3 Encounters:  01/19/21 (!) 327 lb (148.3 kg)  10/09/20 (!) 333 lb (151 kg)  09/15/20 (!) 333 lb (151 kg)     He  was last seen for hypertension 6 months ago.  BP at that visit was 132/86. Management since that visit includes continue same medication.  He reports good compliance with treatment. He is not having side effects.  He is following a Regular diet. He is not exercising. He does not smoke.  Use of agents associated with hypertension: none.   Outside blood pressures are not checked. Symptoms: No chest pain No chest pressure  No palpitations No syncope  No dyspnea No orthopnea  No paroxysmal nocturnal dyspnea No lower extremity edema   Pertinent labs: Lab Results  Component Value Date   NA 135 10/09/2020   K 4.1 10/09/2020   CREATININE 0.92 10/09/2020   GFRNONAA >60 10/09/2020   GLUCOSE 273 (H) 10/09/2020   TSH 3.120 07/18/2019     The 10-year ASCVD risk score (Arnett DK, et al., 2019) is: 6%   ---------------------------------------------------------------------------------------------------     Medications: Outpatient Medications Prior to Visit  Medication Sig   acetaminophen (TYLENOL) 500 MG tablet Take 500 mg by mouth every 6 (six) hours as needed.   acetaminophen (TYLENOL) 650 MG CR tablet Tylenol Arthritis 650 mg tablet,extended release   2 tablets every day by oral route.   colchicine 0.6 MG tablet First day: Take 2 tablets.  Wait 1 hour and take 1 more tablet. Days 2-5. Take one tablet daily.   hydrochlorothiazide (MICROZIDE) 12.5 MG  capsule Take 1 capsule (12.5 mg total) by mouth daily.   ibuprofen (ADVIL) 800 MG tablet TAKE 1 TABLET BY MOUTH TWICE DAILY AS NEEDED   irbesartan-hydrochlorothiazide (AVALIDE) 300-12.5 MG tablet TAKE 1 TABLET BY MOUTH DAILY   loratadine (CLARITIN) 10 MG tablet Take 10 mg by mouth daily.   meclizine (ANTIVERT) 25 MG tablet Take 1 tablet (25 mg total) by mouth 3 (three) times daily as needed for dizziness.   metFORMIN (GLUCOPHAGE-XR) 500 MG 24 hr tablet TAKE TWO (2) TABLETS BY MOUTH DAILY   ONE TOUCH ULTRA TEST test strip USE UP TO 3  TIMES DAILY AS DIRECTED   Probiotic Product (PROBIOTIC DAILY PO) Take by mouth.   psyllium (REGULOID) 0.52 g capsule Take 0.52 g by mouth daily.   sertraline (ZOLOFT) 25 MG tablet Take 25 mg by mouth daily.   sertraline (ZOLOFT) 50 MG tablet Take 1 tablet (50 mg total) by mouth daily.   No facility-administered medications prior to visit.    Review of Systems  Constitutional:  Negative for appetite change, chills and fever.  Respiratory:  Negative for chest tightness, shortness of breath and wheezing.   Cardiovascular:  Negative for chest pain and palpitations.  Gastrointestinal:  Negative for abdominal pain, nausea and vomiting.  Psychiatric/Behavioral:  Positive for dysphoric mood.       Objective    BP (!) 148/95 (BP Location: Right Wrist, Patient Position: Sitting, Cuff Size: Large)   Pulse (!) 51   Temp 98.9 F (37.2 C) (Oral)   Resp 18   Wt (!) 327 lb (148.3 kg) Comment: home measurement this morning. Patient refused to weigh in the office  SpO2 100% Comment: room air  BMI 44.35 kg/m  {Show previous vital signs (optional):23777}  Physical Exam   General appearance: Severely obese male, cooperative and in no acute distress Head: Normocephalic, without obvious abnormality, atraumatic Respiratory: Respirations even and unlabored, normal respiratory rate Extremities: All extremities are intact.  Skin: Skin color, texture, turgor normal. No rashes seen  Psych: Appropriate mood and affect. Neurologic: Mental status: Alert, oriented to person, place, and time, thought content appropriate.   Results for orders placed or performed in visit on 01/19/21  POCT HgB A1C  Result Value Ref Range   Hemoglobin A1C 6.3 (A) 4.0 - 5.6 %   Est. average glucose Bld gHb Est-mCnc 134     Assessment & Plan     1. Type 2 diabetes mellitus without complication, without long-term current use of insulin (HCC) Well controlled.  Continue current medications.    2. Colon cancer  screening  - Ambulatory referral to gastroenterology for colonoscopy  3. Essential (primary) hypertension Labile blood pressure, but reports home BP better than office readings. Continue current medications.    4. Other mixed anxiety disorders Doing well on 50mg  sertraline. Continue current medications.  Can try reducing to 1/2 tablet daily after the holidays.   5. Hyperuricemia Has started drinking 2 oz tart cherry juice and not had any recent gout flares.   6. Obesity, morbid (HCC) Is working on diet and hopes to start getting more exercise. Continues on weight watchers program.    Follow up 6 months.       The entirety of the information documented in the History of Present Illness, Review of Systems and Physical Exam were personally obtained by me. Portions of this information were initially documented by the CMA and reviewed by me for thoroughness and accuracy.     , MD  [facility] [phone] (phone) [phone] (fax)  [place]

## 2021-01-25 ENCOUNTER — Telehealth: Payer: Self-pay

## 2021-01-25 NOTE — Telephone Encounter (Signed)
CALLED PATIENT NO ANSWER LEFT VOICEMAIL FOR A CALL BACK ? ?

## 2021-02-04 ENCOUNTER — Other Ambulatory Visit: Payer: Self-pay

## 2021-02-04 MED ORDER — NA SULFATE-K SULFATE-MG SULF 17.5-3.13-1.6 GM/177ML PO SOLN: 1.0000 | Freq: Once | ORAL | 0 refills | Status: AC

## 2021-03-07 ENCOUNTER — Other Ambulatory Visit: Payer: Self-pay

## 2021-03-24 ENCOUNTER — Other Ambulatory Visit: Payer: Self-pay | Admitting: Family Medicine

## 2021-03-24 DIAGNOSIS — I10 Essential (primary) hypertension: Secondary | ICD-10-CM

## 2021-03-24 NOTE — Telephone Encounter (Signed)
Requested Prescriptions  Pending Prescriptions Disp Refills   hydrochlorothiazide (MICROZIDE) 12.5 MG capsule [Pharmacy Med Name: HYDROCHLOROTHIAZIDE 12.5MG  CAPSULES] 90 capsule 1    Sig: TAKE 1 CAPSULE(12.5 MG) BY MOUTH DAILY     Cardiovascular: Diuretics - Thiazide Failed - 03/24/2021  3:15 AM      Failed - Last BP in normal range    BP Readings from Last 1 Encounters:  01/19/21 (!) 148/95         Passed - Ca in normal range and within 360 days    Calcium  Date Value Ref Range Status  10/09/2020 9.0 8.9 - 10.3 mg/dL Final   Calcium, Total  Date Value Ref Range Status  12/09/2013 8.1 (L) 8.5 - 10.1 mg/dL Final         Passed - Cr in normal range and within 360 days    Creatinine  Date Value Ref Range Status  12/09/2013 1.07 0.60 - 1.30 mg/dL Final   Creatinine, Ser  Date Value Ref Range Status  10/09/2020 0.92 0.61 - 1.24 mg/dL Final   Creatinine, POC  Date Value Ref Range Status  09/15/2015 n/a mg/dL Final         Passed - K in normal range and within 360 days    Potassium  Date Value Ref Range Status  10/09/2020 4.1 3.5 - 5.1 mmol/L Final  12/09/2013 3.6 3.5 - 5.1 mmol/L Final         Passed - Na in normal range and within 360 days    Sodium  Date Value Ref Range Status  10/09/2020 135 135 - 145 mmol/L Final  07/23/2020 143 134 - 144 mmol/L Final  12/09/2013 141 136 - 145 mmol/L Final         Passed - Valid encounter within last 6 months    Recent Outpatient Visits          2 months ago Type 2 diabetes mellitus without complication, without long-term current use of insulin [facility])   [facility] Malva Limes, MD   5 months ago Pyelonephritis   [facility] Malva Limes, MD   6 months ago Dizziness   [facility] Larae Grooms, NP   8 months ago Essential (primary) hypertension   [facility] Malva Limes, MD   1 year ago Annual physical exam   [facility] Malva Limes, MD      Future Appointments            In 3 months Fisher, Demetrios Isaacs, MD [facility]

## 2021-03-28 ENCOUNTER — Encounter
Admission: RE | Disposition: A | Discharge status: Home / Self Care | Payer: Self-pay | Source: Home / Self Care | Attending: Gastroenterology

## 2021-03-28 ENCOUNTER — Other Ambulatory Visit: Payer: Self-pay

## 2021-03-28 ENCOUNTER — Ambulatory Visit: Payer: Managed Care, Other (non HMO) | Admitting: Anesthesiology

## 2021-03-28 ENCOUNTER — Encounter: Payer: Self-pay | Admitting: Gastroenterology

## 2021-03-28 ENCOUNTER — Ambulatory Visit
Admission: RE | Admit: 2021-03-28 | Discharge: 2021-03-28 | Disposition: A | Discharge status: Home / Self Care | Payer: Managed Care, Other (non HMO) | Attending: Gastroenterology | Admitting: Gastroenterology

## 2021-03-28 DIAGNOSIS — E119 Type 2 diabetes mellitus without complications: Secondary | ICD-10-CM | POA: Diagnosis not present

## 2021-03-28 DIAGNOSIS — I10 Essential (primary) hypertension: Secondary | ICD-10-CM | POA: Diagnosis not present

## 2021-03-28 DIAGNOSIS — Z1211 Encounter for screening for malignant neoplasm of colon: Secondary | ICD-10-CM | POA: Diagnosis not present

## 2021-03-28 DIAGNOSIS — K621 Rectal polyp: Secondary | ICD-10-CM | POA: Diagnosis not present

## 2021-03-28 DIAGNOSIS — Z6841 Body Mass Index (BMI) 40.0 and over, adult: Secondary | ICD-10-CM | POA: Diagnosis not present

## 2021-03-28 HISTORY — PX: POLYPECTOMY: SHX5525

## 2021-03-28 HISTORY — PX: COLONOSCOPY: SHX5424

## 2021-03-28 LAB — GLUCOSE, CAPILLARY
Glucose-Capillary: 145 mg/dL — ABNORMAL HIGH (ref 70–99)
Glucose-Capillary: 140 mg/dL — ABNORMAL HIGH (ref 70–99)

## 2021-03-28 SURGERY — COLONOSCOPY
Anesthesia: General | Site: Rectum

## 2021-03-28 MED ORDER — ACETAMINOPHEN 160 MG/5ML PO SOLN: 325.0000 mg | Freq: Once | ORAL | Status: DC

## 2021-03-28 MED ORDER — GLYCOPYRROLATE 0.2 MG/ML IJ SOLN
INTRAMUSCULAR | Status: DC | PRN
  Administered 2021-03-28: .2 mg via INTRAVENOUS

## 2021-03-28 MED ORDER — LACTATED RINGERS IV SOLN: INTRAVENOUS | Status: DC

## 2021-03-28 MED ORDER — STERILE WATER FOR IRRIGATION IR SOLN
Status: DC | PRN
  Administered 2021-03-28: 250 mL

## 2021-03-28 MED ORDER — PROPOFOL 10 MG/ML IV BOLUS
INTRAVENOUS | Status: DC | PRN
  Administered 2021-03-28 (×5): 100 mg via INTRAVENOUS

## 2021-03-28 MED ORDER — ACETAMINOPHEN 325 MG PO TABS: 325.0000 mg | ORAL_TABLET | Freq: Once | ORAL | Status: DC

## 2021-03-28 MED ORDER — SODIUM CHLORIDE 0.9 % IV SOLN: INTRAVENOUS | Status: DC

## 2021-03-28 SURGICAL SUPPLY — 10 items
CLIP HMST11XOPN 235X2.8X (MISCELLANEOUS) IMPLANT
CLIP RESOLUTION 360 11X235 (MISCELLANEOUS) ×3
GOWN CVR UNV OPN BCK APRN NK (MISCELLANEOUS) ×4 IMPLANT
GOWN ISOL THUMB LOOP REG UNIV (MISCELLANEOUS) ×6
KIT PRC NS LF DISP ENDO (KITS) ×2 IMPLANT
KIT PROCEDURE OLYMPUS (KITS) ×3
MANIFOLD NEPTUNE II (INSTRUMENTS) ×3 IMPLANT
SNARE COLD EXACTO (MISCELLANEOUS) ×1 IMPLANT
TRAP ETRAP POLY (MISCELLANEOUS) ×1 IMPLANT
WATER STERILE IRR 250ML POUR (IV SOLUTION) ×3 IMPLANT

## 2021-03-28 NOTE — Anesthesia Preprocedure Evaluation (Signed)
Anesthesia Evaluation  Patient identified by MRN, date of birth, ID band Patient awake    Reviewed: Allergy & Precautions, H&P , NPO status , Patient's Chart, lab work & pertinent test results  Airway Mallampati: III  TM Distance: >3 FB Neck ROM: full    Dental no notable dental hx.    Pulmonary    Pulmonary exam normal breath sounds clear to auscultation       Cardiovascular hypertension, Normal cardiovascular exam Rhythm:regular Rate:Normal     Neuro/Psych    GI/Hepatic   Endo/Other  diabetesMorbid obesity  Renal/GU      Musculoskeletal   Abdominal   Peds  Hematology   Anesthesia Other Findings   Reproductive/Obstetrics                             Anesthesia Physical Anesthesia Plan  ASA: 3  Anesthesia Plan: General   Post-op Pain Management: Minimal or no pain anticipated   Induction: Intravenous  PONV Risk Score and Plan: 2 and Treatment may vary due to age or medical condition, Propofol infusion and TIVA  Airway Management Planned: Natural Airway  Additional Equipment:   Intra-op Plan:   Post-operative Plan:   Informed Consent: I have reviewed the patients History and Physical, chart, labs and discussed the procedure including the risks, benefits and alternatives for the proposed anesthesia with the patient or authorized representative who has indicated his/her understanding and acceptance.     Dental Advisory Given  Plan Discussed with: CRNA  Anesthesia Plan Comments:         Anesthesia Quick Evaluation

## 2021-03-28 NOTE — Transfer of Care (Signed)
Immediate Anesthesia Transfer of Care Note  Patient: [name]  Procedure(s) Performed: COLONOSCOPY (Rectum) POLYPECTOMY (Rectum)  Patient Location: PACU  Anesthesia Type: General  Level of Consciousness: awake, alert  and patient cooperative  Airway and Oxygen Therapy: Patient Spontanous Breathing and Patient connected to supplemental oxygen  Post-op Assessment: Post-op Vital signs reviewed, Patient's Cardiovascular Status Stable, Respiratory Function Stable, Patent Airway and No signs of Nausea or vomiting  Post-op Vital Signs: Reviewed and stable  Complications: No notable events documented.

## 2021-03-28 NOTE — Op Note (Signed)
[facility] Gastroenterology Patient Name: [name] Procedure Date: 03/28/2021 8:21 AM MRN: [id number] Account #: [id number] Date of Birth: 15-Jun-1970 Admit Type: Outpatient Age: 51 Room: [facility] OR ROOM 01 Gender: Male Note Status: Finalized Instrument Name: [id number] Procedure:             Colonoscopy Indications:           Screening for colorectal malignant neoplasm Providers:             Lucilla Lame MD, MD Referring MD:          Kirstie Peri. Caryn Section, MD (Referring MD) Medicines:             Propofol per Anesthesia Complications:         No immediate complications. Procedure:             Pre-Anesthesia Assessment:                        - Prior to the procedure, a History and Physical was                         performed, and patient medications and allergies were                         reviewed. The patient's tolerance of previous                         anesthesia was also reviewed. The risks and benefits                         of the procedure and the sedation options and risks                         were discussed with the patient. All questions were                         answered, and informed consent was obtained. Prior                         Anticoagulants: The patient has taken no previous                         anticoagulant or antiplatelet agents. ASA Grade                         Assessment: II - A patient with mild systemic disease.                         After reviewing the risks and benefits, the patient                         was deemed in satisfactory condition to undergo the                         procedure.                        After obtaining informed consent, the colonoscope was  passed under direct vision. Throughout the procedure,                         the patient's blood pressure, pulse, and oxygen                         saturations were monitored continuously. The                         Colonoscope was  introduced through the anus and                         advanced to the the cecum, identified by appendiceal                         orifice and ileocecal valve. The colonoscopy was                         performed without difficulty. The patient tolerated                         the procedure well. The quality of the bowel                         preparation was excellent. Findings:      The perianal and digital rectal examinations were normal.      A 6 mm polyp was found in the rectum. The polyp was sessile. The polyp       was removed with a cold snare. Resection and retrieval were complete. To       prevent bleeding post-intervention, one hemostatic clip was successfully       placed (MR conditional). There was no bleeding at the end of the       procedure. Impression:            - One 6 mm polyp in the rectum, removed with a cold                         snare. Resected and retrieved. Clip (MR conditional)                         was placed. Recommendation:        - Discharge patient to home.                        - Resume previous diet.                        - Continue present medications.                        - Await pathology results.                        - If the pathology report reveals adenomatous tissue,                         then repeat the colonoscopy for surveillance in 7  years otherwisse 10 years. Procedure Code(s):     --- Professional ---                        [phone], Colonoscopy, flexible; with removal of                         tumor(s), polyp(s), or other lesion(s) by snare                         technique Diagnosis Code(s):     --- Professional ---                        Z12.11, Encounter for screening for malignant neoplasm                         of colon                        K62.1, Rectal polyp CPT copyright 2019 American Medical Association. All rights reserved. The codes documented in this report are preliminary and upon  coder review may  be revised to meet current compliance requirements. Lucilla Lame MD, MD 03/28/2021 8:46:39 AM This report has been signed electronically. Number of Addenda: 0 Note Initiated On: 03/28/2021 8:21 AM Scope Withdrawal Time: 0 hours 10 minutes 43 seconds  Total Procedure Duration: 0 hours 13 minutes 4 seconds  Estimated Blood Loss:  Estimated blood loss: none.      [facility]

## 2021-03-28 NOTE — Anesthesia Postprocedure Evaluation (Signed)
Anesthesia Post Note  Patient: [name]  Procedure(s) Performed: COLONOSCOPY (Rectum) POLYPECTOMY (Rectum)     Patient location during evaluation: PACU Anesthesia Type: General Level of consciousness: awake and alert and oriented Pain management: satisfactory to patient Vital Signs Assessment: post-procedure vital signs reviewed and stable Respiratory status: spontaneous breathing, nonlabored ventilation and respiratory function stable Cardiovascular status: blood pressure returned to baseline and stable Postop Assessment: Adequate PO intake and No signs of nausea or vomiting Anesthetic complications: no   No notable events documented.  [name]

## 2021-03-28 NOTE — H&P (Signed)
Midge Minium, MD [facility] [address], [address], [place] [address] Phone: [phone] Fax : [phone]  Primary Care Physician:  Malva Limes, MD Primary Gastroenterologist:  Dr. Servando Snare  Pre-Procedure History & Physical: HPI:  [name] is a 51 y.o. male is here for a screening colonoscopy.   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 Lovell Sheehan, [facility]   KNEE SURGERY  2006   left knee reconstruction after tibial fracture   LEG SURGERY  1975   leg and foot surgery; right lower leg circa    Prior to Admission medications   Medication Sig Start Date End Date Taking? Authorizing Provider  acetaminophen (TYLENOL) 500 MG tablet Take 500 mg by mouth every 6 (six) hours as needed.   Yes [provider]  hydrochlorothiazide (MICROZIDE) 12.5 MG capsule TAKE 1 CAPSULE(12.5 MG) BY MOUTH DAILY 03/24/21  Yes Malva Limes, MD  ibuprofen (ADVIL) 800 MG tablet TAKE 1 TABLET BY MOUTH TWICE DAILY AS NEEDED 08/27/19  Yes Malva Limes, MD  irbesartan-hydrochlorothiazide (AVALIDE) 300-12.5 MG tablet TAKE 1 TABLET BY MOUTH DAILY 01/16/21  Yes Malva Limes, MD  loratadine (CLARITIN) 10 MG tablet Take 10 mg by mouth daily.   Yes [provider]  meclizine (ANTIVERT) 25 MG tablet Take 1 tablet (25 mg total) by mouth 3 (three) times daily as needed for dizziness. 09/15/20  Yes Larae Grooms, NP  metFORMIN (GLUCOPHAGE-XR) 500 MG 24 hr tablet TAKE TWO (2) TABLETS BY MOUTH DAILY 10/25/20  Yes Malva Limes, MD  Probiotic Product (PROBIOTIC DAILY PO) Take by mouth.   Yes [provider]  psyllium (REGULOID) 0.52 g capsule Take 0.52 g by mouth daily.   Yes [provider]  sertraline (ZOLOFT) 50 MG tablet Take 1 tablet (50 mg total) by mouth daily. 04/26/20  Yes Malva Limes, MD  acetaminophen (TYLENOL) 650 MG CR tablet Tylenol Arthritis 650 mg tablet,extended release   2 tablets every day by oral route. 05/28/09   [provider]  colchicine 0.6 MG tablet First day: Take 2 tablets.  Wait 1 hour and take 1 more tablet. Days 2-5. Take one tablet daily. Patient not taking: Reported on 03/07/2021 12/15/20   Orvil Feil, PA-C  ONE TOUCH ULTRA TEST test strip USE UP TO 3 TIMES DAILY AS DIRECTED 01/24/16   Malva Limes, MD  traZODone (DESYREL) 50 MG tablet TAKE 2 TO 3 TABLETS BY MOUTH AT BEDTIME Patient not taking: No sig reported 01/07/18 03/10/19  Malva Limes, MD    Allergies as of 02/03/2021 - Review Complete 01/19/2021  Allergen Reaction Noted   Doxycycline Rash 06/23/2016   Amlodipine besylate  09/16/2014   Augmentin [amoxicillin-pot clavulanate]  07/01/2014   Cyclobenzaprine  09/16/2014   Indomethacin Swelling 01/12/2016   Keflex  [cephalexin]  09/16/2014    Family History  Problem Relation Age of Onset   Panic disorder Mother    Diabetes Mother    Alcohol abuse Father    Pancreatic cancer Father    Stomach cancer Father    Lung cancer Father    Drug abuse Sister    Healthy Brother  Prostate cancer Other    Lung cancer Other    Anxiety disorder Other    Colon cancer Neg Hx    Heart disease Neg Hx     Social History   Socioeconomic History   Marital status: Married    Spouse name: Not on file   Number of children: 0   Years of education: Not on file   Highest education level: Not on file  Occupational History   Occupation: [employer]    Comment: Employed  Tobacco Use   Smoking status: Never   Smokeless tobacco: Never  Vaping Use   Vaping Use: Never used  Substance and Sexual Activity   Alcohol use: No    Alcohol/week: 0.0 standard drinks   Drug use: No   Sexual activity: Yes     Birth control/protection: None  Other Topics Concern   Not on file  Social History Narrative   Not on file   Social Determinants of Health   Financial Resource Strain: Not on file  Food Insecurity: Not on file  Transportation Needs: Not on file  Physical Activity: Not on file  Stress: Not on file  Social Connections: Not on file  Intimate Partner Violence: Not on file    Review of Systems: See HPI, otherwise negative ROS  Physical Exam: BP (!) 162/95    Pulse (!) 51    Temp (!) 97.3 F (36.3 C) (Temporal)    Ht 6' (1.829 m)    Wt (!) 151 kg    SpO2 98%    BMI 45.16 kg/m  General:   Alert,  pleasant and cooperative in NAD Head:  Normocephalic and atraumatic. Neck:  Supple; no masses or thyromegaly. Lungs:  Clear throughout to auscultation.    Heart:  Regular rate and rhythm. Abdomen:  Soft, nontender and nondistended. Normal bowel sounds, without guarding, and without rebound.   Neurologic:  Alert and  oriented x4;  grossly normal neurologically.  Impression/Plan: [name] is now here to undergo a screening colonoscopy.  Risks, benefits, and alternatives regarding colonoscopy have been reviewed with the patient.  Questions have been answered.  All parties agreeable.

## 2021-03-28 NOTE — Anesthesia Procedure Notes (Signed)
Procedure Name: MAC Date/Time: 03/28/2021 8:29 AM Performed by: Georga Bora, CRNA Pre-anesthesia Checklist: Patient identified, Emergency Drugs available, Suction available, Patient being monitored and Timeout performed Patient Re-evaluated:Patient Re-evaluated prior to induction Oxygen Delivery Method: Nasal cannula Induction Type: IV induction Placement Confirmation: positive ETCO2 and breath sounds checked- equal and bilateral

## 2021-03-29 ENCOUNTER — Encounter: Payer: Self-pay | Admitting: Gastroenterology

## 2021-03-29 LAB — SURGICAL PATHOLOGY

## 2021-03-31 ENCOUNTER — Encounter: Payer: Self-pay | Admitting: Gastroenterology

## 2021-04-21 ENCOUNTER — Other Ambulatory Visit: Payer: Self-pay | Admitting: Family Medicine

## 2021-05-06 ENCOUNTER — Encounter: Payer: Self-pay | Admitting: Family Medicine

## 2021-05-06 MED ORDER — IBUPROFEN 800 MG PO TABS
ORAL_TABLET | ORAL | 3 refills | Status: DC
Start: 2021-05-06 — End: 2023-02-19

## 2021-05-06 NOTE — Telephone Encounter (Signed)
Last refill: 08/27/2019 #60 with 1 refill ? ?Last office visit: 01/19/2021 ?Next office visit: 07/20/2021 ? ?

## 2021-05-08 ENCOUNTER — Other Ambulatory Visit: Payer: Self-pay | Admitting: Family Medicine

## 2021-05-08 DIAGNOSIS — E119 Type 2 diabetes mellitus without complications: Secondary | ICD-10-CM

## 2021-05-23 ENCOUNTER — Other Ambulatory Visit: Payer: Self-pay | Admitting: Family Medicine

## 2021-05-23 DIAGNOSIS — F410 Panic disorder [episodic paroxysmal anxiety] without agoraphobia: Secondary | ICD-10-CM

## 2021-06-07 LAB — HM DIABETES EYE EXAM

## 2021-07-03 ENCOUNTER — Telehealth: Payer: Managed Care, Other (non HMO) | Admitting: Nurse Practitioner

## 2021-07-03 DIAGNOSIS — J069 Acute upper respiratory infection, unspecified: Secondary | ICD-10-CM

## 2021-07-03 MED ORDER — FLUTICASONE PROPIONATE 50 MCG/ACT NA SUSP: 2.0000 | Freq: Every day | NASAL | 0 refills | Status: DC

## 2021-07-03 MED ORDER — BENZONATATE 200 MG PO CAPS: 200.0000 mg | ORAL_CAPSULE | Freq: Two times a day (BID) | ORAL | 0 refills | Status: AC | PRN

## 2021-07-03 NOTE — Progress Notes (Signed)
?Virtual Visit Consent  ? ?[name], you are scheduled for a virtual visit with a [facility] provider today. Just as with appointments in the office, your consent must be obtained to participate. Your consent will be active for this visit and any virtual visit you may have with one of our providers in the next 365 days. If you have a MyChart account, a copy of this consent can be sent to you electronically. ? ?As this is a virtual visit, video technology does not allow for your provider to perform a traditional examination. This may limit your provider's ability to fully assess your condition. If your provider identifies any concerns that need to be evaluated in person or the need to arrange testing (such as labs, EKG, etc.), we will make arrangements to do so. Although advances in technology are sophisticated, we cannot ensure that it will always work on either your end or our end. If the connection with a video visit is poor, the visit may have to be switched to a telephone visit. With either a video or telephone visit, we are not always able to ensure that we have a secure connection. ? ?By engaging in this virtual visit, you consent to the provision of healthcare and authorize for your insurance to be billed (if applicable) for the services provided during this visit. Depending on your insurance coverage, you may receive a charge related to this service. ? ?I need to obtain your verbal consent now. Are you willing to proceed with your visit today? [name] has provided verbal consent on 07/03/2021 for a virtual visit (video or telephone). Claiborne Rigg, NP ? ?Date: 07/03/2021 10:59 AM ? ?Virtual Visit via Video Note  ? ?IClaiborne Rigg, connected with  [name]  ([id number], [birth year]) on 07/03/21 at 10:15 AM EDT by a video-enabled telemedicine application and verified that I am speaking with the correct person using two identifiers. ? ?Location: ?Patient: Virtual Visit Location Patient:  Home ?Provider: Virtual Visit Location Provider: Home Office ?  ?I discussed the limitations of evaluation and management by telemedicine and the availability of in person appointments. The patient expressed understanding and agreed to proceed.   ? ?History of Present Illness: ?[name] is a 51 y.o. who identifies as a male who was assigned male at birth, and is being seen today for URI. ? ?Upper Respiratory Infection: Patient complains of symptoms of a URI. Symptoms include  itchy eyes, ear pressure, cough, sneezing, headache, ears itching and rhinorrhea . Onset of symptoms was 3 days ago, unchanged since that time. He also c/o an episode of  lightheadedness.  Evaluation to date: none. Treatment to date: antihistamines and cough suppressants. ?  ?Problems:  ?Patient Active Problem List  ? Diagnosis Date Noted  ? Special screening for malignant neoplasms, colon   ? Rectal polyp   ? 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 Allergic rhinitis due to pollen 05/11/2016  ? Hyperuricemia 01/12/2016  ? History of gout 09/15/2015  ? Bradycardia 09/16/2014  ? Panic attacks 09/16/2014  ? Arthralgia of hip or thigh 03/21/2009  ? Anxiety disorder 07/07/2008  ? Proteinuria 10/22/2006  ? Essential (primary) hypertension 06/11/2006  ? Obesity, morbid (HCC) 06/11/2006  ? Diabetes mellitus, type 2 (HCC) 01/28/2000  ? Insomnia 05/03/1999  ?  ?Allergies:  ?Allergies  ?Allergen Reactions  ? Doxycycline Rash  ? Amlodipine Besylate   ?  Constipation, palitations, swelling, shortness of breath  ? Augmentin [Amoxicillin-Pot  Clavulanate]   ?  intolerant: Causes insomnia  ? Indomethacin Swelling  ?  Caused Fluid retention  ? Keflex  [Cephalexin]   ?  intolerant  ? Cyclobenzaprine Palpitations  ? ?Medications:  ?Current Outpatient Medications:  ?  benzonatate (TESSALON) 200 MG capsule, Take 1 capsule (200 mg total) by mouth 2 (two) times daily as needed for up to 14 days for cough., Disp: 28 capsule, Rfl: 0 ?   fluticasone (FLONASE) 50 MCG/ACT nasal spray, Place 2 sprays into both nostrils daily., Disp: 16 g, Rfl: 0 ?  acetaminophen (TYLENOL) 500 MG tablet, Take 500 mg by mouth every 6 (six) hours as needed., Disp: , Rfl:  ?  acetaminophen (TYLENOL) 650 MG CR tablet, Tylenol Arthritis 650 mg tablet,extended release   2 tablets every day by oral route., Disp: , Rfl:  ?  colchicine 0.6 MG tablet, First day: Take 2 tablets.  Wait 1 hour and take 1 more tablet. Days 2-5. Take one tablet daily. (Patient not taking: Reported on 03/07/2021), Disp: 8 tablet, Rfl: 0 ?  hydrochlorothiazide (MICROZIDE) 12.5 MG capsule, TAKE 1 CAPSULE(12.5 MG) BY MOUTH DAILY, Disp: 90 capsule, Rfl: 1 ?  ibuprofen (ADVIL) 800 MG tablet, TAKE 1 TABLET BY MOUTH TWICE DAILY AS NEEDED, Disp: 60 tablet, Rfl: 3 ?  irbesartan-hydrochlorothiazide (AVALIDE) 300-12.5 MG tablet, TAKE 1 TABLET BY MOUTH DAILY, Disp: 90 tablet, Rfl: 4 ?  loratadine (CLARITIN) 10 MG tablet, Take 10 mg by mouth daily., Disp: , Rfl:  ?  meclizine (ANTIVERT) 25 MG tablet, Take 1 tablet (25 mg total) by mouth 3 (three) times daily as needed for dizziness., Disp: 30 tablet, Rfl: 0 ?  metFORMIN (GLUCOPHAGE-XR) 500 MG 24 hr tablet, TAKE 2 TABLETS BY MOUTH DAILY, Disp: 180 tablet, Rfl: 4 ?  ONE TOUCH ULTRA TEST test strip, USE UP TO 3 TIMES DAILY AS DIRECTED, Disp: 100 each, Rfl: 5 ?  Probiotic Product (PROBIOTIC DAILY PO), Take by mouth., Disp: , Rfl:  ?  psyllium (REGULOID) 0.52 g capsule, Take 0.52 g by mouth daily., Disp: , Rfl:  ?  sertraline (ZOLOFT) 50 MG tablet, TAKE 1 TABLET(50 MG) BY MOUTH DAILY, Disp: 90 tablet, Rfl: 4 ? ?Observations/Objective: ?Patient is well-developed, well-nourished in no acute distress.  ?Resting comfortably  at home.  ?Head is normocephalic, atraumatic.  ?No labored breathing.  ?Speech is clear and coherent with logical content.  ?Patient is alert and oriented at baseline.  ? ? ?Assessment and Plan: ?1. Viral URI with cough ?- benzonatate (TESSALON) 200 MG  capsule; Take 1 capsule (200 mg total) by mouth 2 (two) times daily as needed for up to 14 days for cough.  Dispense: 28 capsule; Refill: 0 ?- fluticasone (FLONASE) 50 MCG/ACT nasal spray; Place 2 sprays into both nostrils daily.  Dispense: 16 g; Refill: 0 ? ?INSTRUCTIONS: use a humidifier for nasal congestion ?Drink plenty of fluids, rest and wash hands frequently to avoid the spread of infection ?Alternate tylenol and Motrin for relief of fever  ? ?Follow Up Instructions: ?I discussed the assessment and treatment plan with the patient. The patient was provided an opportunity to ask questions and all were answered. The patient agreed with the plan and demonstrated an understanding of the instructions.  A copy of instructions were sent to the patient via MyChart unless otherwise noted below.  ? ? ? ?The patient was advised to call back or seek an in-person evaluation if the symptoms worsen or if the condition fails to improve as anticipated. ? ?Time:  ?  I spent 12 minutes with the patient via telehealth technology discussing the above problems/concerns.   ? ?Gildardo Pounds, NP  ?

## 2021-07-03 NOTE — Patient Instructions (Signed)
?Denice Bors Bender, thank you for joining Gildardo Pounds, NP for today's virtual visit.  While this provider is not your primary care provider (PCP), if your PCP is located in our provider database this encounter information will be shared with them immediately following your visit. ? ?Consent: ?(Patient) [name] provided verbal consent for this virtual visit at the beginning of the encounter. ? ?Current Medications: ? ?Current Outpatient Medications:  ?  benzonatate (TESSALON) 200 MG capsule, Take 1 capsule (200 mg total) by mouth 2 (two) times daily as needed for up to 14 days for cough., Disp: 28 capsule, Rfl: 0 ?  fluticasone (FLONASE) 50 MCG/ACT nasal spray, Place 2 sprays into both nostrils daily., Disp: 16 g, Rfl: 0 ?  acetaminophen (TYLENOL) 500 MG tablet, Take 500 mg by mouth every 6 (six) hours as needed., Disp: , Rfl:  ?  acetaminophen (TYLENOL) 650 MG CR tablet, Tylenol Arthritis 650 mg tablet,extended release   2 tablets every day by oral route., Disp: , Rfl:  ?  colchicine 0.6 MG tablet, First day: Take 2 tablets.  Wait 1 hour and take 1 more tablet. Days 2-5. Take one tablet daily. (Patient not taking: Reported on 03/07/2021), Disp: 8 tablet, Rfl: 0 ?  hydrochlorothiazide (MICROZIDE) 12.5 MG capsule, TAKE 1 CAPSULE(12.5 MG) BY MOUTH DAILY, Disp: 90 capsule, Rfl: 1 ?  ibuprofen (ADVIL) 800 MG tablet, TAKE 1 TABLET BY MOUTH TWICE DAILY AS NEEDED, Disp: 60 tablet, Rfl: 3 ?  irbesartan-hydrochlorothiazide (AVALIDE) 300-12.5 MG tablet, TAKE 1 TABLET BY MOUTH DAILY, Disp: 90 tablet, Rfl: 4 ?  loratadine (CLARITIN) 10 MG tablet, Take 10 mg by mouth daily., Disp: , Rfl:  ?  meclizine (ANTIVERT) 25 MG tablet, Take 1 tablet (25 mg total) by mouth 3 (three) times daily as needed for dizziness., Disp: 30 tablet, Rfl: 0 ?  metFORMIN (GLUCOPHAGE-XR) 500 MG 24 hr tablet, TAKE 2 TABLETS BY MOUTH DAILY, Disp: 180 tablet, Rfl: 4 ?  ONE TOUCH ULTRA TEST test strip, USE UP TO 3 TIMES DAILY AS DIRECTED, Disp: 100 each,  Rfl: 5 ?  Probiotic Product (PROBIOTIC DAILY PO), Take by mouth., Disp: , Rfl:  ?  psyllium (REGULOID) 0.52 g capsule, Take 0.52 g by mouth daily., Disp: , Rfl:  ?  sertraline (ZOLOFT) 50 MG tablet, TAKE 1 TABLET(50 MG) BY MOUTH DAILY, Disp: 90 tablet, Rfl: 4  ? ?Medications ordered in this encounter:  ?Meds ordered this encounter  ?Medications  ? benzonatate (TESSALON) 200 MG capsule  ?  Sig: Take 1 capsule (200 mg total) by mouth 2 (two) times daily as needed for up to 14 days for cough.  ?  Dispense:  28 capsule  ?  Refill:  0  ?  Order Specific Question:   Supervising Provider  ?  Answer:   LEM, LANS [[id number]]  ? fluticasone (FLONASE) 50 MCG/ACT nasal spray  ?  Sig: Place 2 sprays into both nostrils daily.  ?  Dispense:  16 g  ?  Refill:  0  ?  Order Specific Question:   Supervising Provider  ?  Answer:   SALVOTORE, PEYSER [[id number]]  ?  ? ?*If you need refills on other medications prior to your next appointment, please contact your pharmacy* ? ?Follow-Up: ?Call back or seek an in-person evaluation if the symptoms worsen or if the condition fails to improve as anticipated. ? ?Other Instructions ?INSTRUCTIONS: use a humidifier for nasal congestion ?Drink plenty of fluids, rest and wash hands frequently to avoid  the spread of infection ?Alternate tylenol and Motrin for relief of fever  ? ? ?If you have been instructed to have an in-person evaluation today at a local Urgent Care facility, please use the link below. It will take you to a list of all of our available [facility], including address, phone number and hours of operation. Please do not delay care.  ?[facility] ? ?If you or a family member do not have a primary care provider, use the link below to schedule a visit and establish care. When you choose a [facility] primary care physician or advanced practice provider, you gain a long-term partner in health. ?Find a Primary Care Provider ? ?Learn more about [facility] in-office and  virtual care options: ?[facility]  ?

## 2021-07-18 ENCOUNTER — Other Ambulatory Visit: Payer: Self-pay

## 2021-07-18 ENCOUNTER — Emergency Department
Admission: EM | Admit: 2021-07-18 | Discharge: 2021-07-18 | Discharge status: Against Medical Advice | Payer: Managed Care, Other (non HMO) | Attending: Emergency Medicine | Admitting: Emergency Medicine

## 2021-07-18 ENCOUNTER — Encounter: Payer: Self-pay | Admitting: Emergency Medicine

## 2021-07-18 DIAGNOSIS — W269XXA Contact with unspecified sharp object(s), initial encounter: Secondary | ICD-10-CM | POA: Insufficient documentation

## 2021-07-18 DIAGNOSIS — S65502A Unspecified injury of blood vessel of right middle finger, initial encounter: Secondary | ICD-10-CM | POA: Diagnosis present

## 2021-07-18 DIAGNOSIS — S61212A Laceration without foreign body of right middle finger without damage to nail, initial encounter: Secondary | ICD-10-CM | POA: Diagnosis not present

## 2021-07-18 NOTE — ED Triage Notes (Signed)
Patient ambulatory to triage with steady gait, without difficulty or distress noted; pt reports cutting pad of rt middle finger at 530pm; approx 1/2" lac noted with min bleeding

## 2021-07-19 ENCOUNTER — Ambulatory Visit
Admission: EM | Admit: 2021-07-19 | Discharge: 2021-07-19 | Disposition: A | Discharge status: Home / Self Care | Payer: Managed Care, Other (non HMO) | Attending: Emergency Medicine | Admitting: Emergency Medicine

## 2021-07-19 DIAGNOSIS — S61212A Laceration without foreign body of right middle finger without damage to nail, initial encounter: Secondary | ICD-10-CM | POA: Diagnosis not present

## 2021-07-19 NOTE — Discharge Instructions (Addendum)
6 sutures placed Please return in 7 to 10 days for removal of stitches  Keep area dry for the next 24 hours, generally do not allow area to become soaking wet  May cleanse daily with normal hygiene using diluted soapy water, pat dry and cover with a nonadherent dressing if at risk for contamination  Please watch for signs of infection such as increased swelling, increased redness, increased pain, puslike drainage, fever or chills, if this occurs at any point please return to urgent care for evaluation

## 2021-07-19 NOTE — ED Provider Notes (Signed)
[facility]    CSN: [id number] Arrival date & time: 07/19/21  0800      History   Chief Complaint No chief complaint on file.   HPI [name] is a 51 y.o. male.   Patient presents with a laceration to the pad of the right middle finger occurring yesterday at approximately 5:30 PM.  Endorses that [name] was attempting to cut weed fabric when his pocket knife sliced his hand, endorses that this was a brand-new knife.  Has covered area with bandage and compression wrap.  Bleeding has subsided.  Patient attempted to skin glue the area however with movement of the finger it is causing it to open and bleed.  Range of motion of the finger intact.  Denies numbness or tingling.  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 Lovell Sheehan, [facility]   COLONOSCOPY N/A 03/28/2021   Procedure: COLONOSCOPY;  Surgeon: Midge Minium, MD;  Location: [facility];  Service: Endoscopy;  Laterality: N/A;   KNEE SURGERY  2006   left knee reconstruction after tibial fracture   LEG SURGERY  1975   leg and foot surgery; right  lower leg circa   POLYPECTOMY  03/28/2021   Procedure: POLYPECTOMY;  Surgeon: Midge Minium, MD;  Location: [facility];  Service: Endoscopy;;       Home Medications    Prior to Admission medications   Medication Sig Start Date End Date Taking? Authorizing Provider  acetaminophen (TYLENOL) 500 MG tablet Take 500 mg by mouth every 6 (six) hours as needed.   Yes [provider]  acetaminophen (TYLENOL) 650 MG CR tablet Tylenol Arthritis 650 mg tablet,extended release   2 tablets every day by oral route. 05/28/09  Yes [provider]  colchicine 0.6 MG tablet First day: Take 2 tablets.  Wait 1 hour and take 1 more tablet. Days 2-5. Take one tablet daily. 12/15/20  Yes Pia Mau M, PA-C  fluticasone (FLONASE) 50 MCG/ACT nasal spray Place 2 sprays into both nostrils daily. 07/03/21  Yes Claiborne Rigg, NP  hydrochlorothiazide (MICROZIDE) 12.5 MG capsule TAKE 1 CAPSULE(12.5 MG) BY MOUTH DAILY 03/24/21  Yes Malva Limes, MD  ibuprofen (ADVIL) 800 MG tablet TAKE 1 TABLET BY MOUTH TWICE DAILY AS NEEDED 05/06/21  Yes Malva Limes, MD  irbesartan-hydrochlorothiazide (AVALIDE) 300-12.5 MG tablet TAKE 1 TABLET BY MOUTH DAILY 04/21/21  Yes Malva Limes, MD  loratadine (CLARITIN) 10 MG tablet Take 10 mg by mouth daily.   Yes [provider]  meclizine (ANTIVERT) 25 MG tablet Take 1 tablet (  25 mg total) by mouth 3 (three) times daily as needed for dizziness. 09/15/20  Yes Larae Grooms, NP  metFORMIN (GLUCOPHAGE-XR) 500 MG 24 hr tablet TAKE 2 TABLETS BY MOUTH DAILY 05/09/21  Yes Malva Limes, MD  ONE TOUCH ULTRA TEST test strip USE UP TO 3 TIMES DAILY AS DIRECTED 01/24/16  Yes Malva Limes, MD  Probiotic Product (PROBIOTIC DAILY PO) Take by mouth.   Yes [provider]  psyllium (REGULOID) 0.52 g capsule Take 0.52 g by mouth daily.   Yes [provider]  sertraline (ZOLOFT) 50 MG tablet TAKE 1 TABLET(50 MG) BY MOUTH DAILY 05/23/21  Yes  Malva Limes, MD  traZODone (DESYREL) 50 MG tablet TAKE 2 TO 3 TABLETS BY MOUTH AT BEDTIME Patient not taking: No sig reported 01/07/18 03/10/19  Malva Limes, MD    Family History Family History  Problem Relation Age of Onset   Panic disorder Mother    Diabetes Mother    Alcohol abuse Father    Pancreatic cancer Father    Stomach cancer Father    Lung cancer Father    Drug abuse Sister    Healthy Brother    Prostate cancer Other    Lung cancer Other    Anxiety disorder Other    Colon cancer Neg Hx    Heart disease Neg Hx     Social History Social History   Tobacco Use   Smoking status: Never   Smokeless tobacco: Never  Vaping Use   Vaping Use: Never used  Substance Use Topics   Alcohol use: No    Alcohol/week: 0.0 standard drinks   Drug use: No     Allergies   Doxycycline, Amlodipine besylate, Augmentin [amoxicillin-pot clavulanate], Indomethacin, Keflex  [cephalexin], and Cyclobenzaprine   Review of Systems Review of Systems  Constitutional: Negative.   Respiratory: Negative.    Cardiovascular: Negative.   Skin:  Positive for wound. Negative for color change, pallor and rash.  Neurological: Negative.     Physical Exam Triage Vital Signs ED Triage Vitals  Enc Vitals Group     BP 07/19/21 0814 136/86     Pulse Rate 07/19/21 0814 60     Resp --      Temp 07/19/21 0814 98.2 F (36.8 C)     Temp Source 07/19/21 0814 Oral     SpO2 07/19/21 0814 96 %     Weight 07/19/21 0811 (!) 331 lb 6.4 oz (150.3 kg)     Height 07/19/21 0811 6' (1.829 m)     Head Circumference --      Peak Flow --      Pain Score 07/19/21 0810 4     Pain Loc --      Pain Edu? --      Excl. in GC? --    No data found.  Updated Vital Signs BP 136/86 (BP Location: Left Arm)   Pulse 60   Temp 98.2 F (36.8 C) (Oral)   Ht 6' (1.829 m)   Wt (!) 331 lb 6.4 oz (150.3 kg)   SpO2 96%   BMI 44.95 kg/m   Visual Acuity Right Eye Distance:   Left Eye Distance:   Bilateral  Distance:    Right Eye Near:   Left Eye Near:    Bilateral Near:     Physical Exam Constitutional:      Appearance: Normal appearance.  HENT:     Head: Normocephalic.  Eyes:     Extraocular  Movements: Extraocular movements intact.  Pulmonary:     Effort: Pulmonary effort is normal.  Skin:    Comments: 0.5 centimeter laceration present to the pad of the right middle finger, no involvement of the joint or tendon, sensation intact, capillary refill less than 3, range of motion and finger intact  Neurological:     Mental Status: [name] is alert and oriented to person, place, and time. Mental status is at baseline.  Psychiatric:        Mood and Affect: Mood normal.        Behavior: Behavior normal.     UC Treatments / Results  Labs (all labs ordered are listed, but only abnormal results are displayed) Labs Reviewed - No data to display  EKG   Radiology No results found.  Procedures Laceration Repair  Date/Time: 07/19/2021 9:06 AM Performed by: Valinda HoarWhite, Wendelyn Kiesling R, NP Authorized by: Valinda HoarWhite, Maitlyn Penza R, NP   Consent:    Consent obtained:  Verbal   Consent given by:  Patient   Risks, benefits, and alternatives were discussed: yes     Risks discussed:  Infection   Alternatives discussed:  No treatment Universal protocol:    Procedure explained and questions answered to patient or proxy's satisfaction: yes     Patient identity confirmed:  Verbally with patient Anesthesia:    Anesthesia method:  Local infiltration   Local anesthetic:  Lidocaine 1% w/o epi Laceration details:    Location:  Finger   Finger location:  R long finger   Length (cm):  0.5 Pre-procedure details:    Preparation:  Patient was prepped and draped in usual sterile fashion Exploration:    Hemostasis achieved with:  Direct pressure   Wound exploration: entire depth of wound visualized   Treatment:    Area cleansed with:  Povidone-iodine and soap and water   Amount of cleaning:  Standard Skin repair:     Repair method:  Sutures   Suture size:  5-0   Suture material:  Prolene   Suture technique:  Simple interrupted   Number of sutures:  6 Approximation:    Approximation:  Close Repair type:    Repair type:  Simple Post-procedure details:    Dressing:  Non-adherent dressing   Procedure completion:  Tolerated (including critical care time)  Medications Ordered in UC Medications - No data to display  Initial Impression / Assessment and Plan / UC Course  I have reviewed the triage vital signs and the nursing notes.  Pertinent labs & imaging results that were available during my care of the patient were reviewed by me and considered in my medical decision making (see chart for details).  Laceration of right middle finger without foreign body without damage to nail, initial encounter  6 sutures placed, advised patient to return in 7 to 10 days for removal, advised patient to keep area dry and leave compression in place for the next 12 hours, may cleanse daily with normal hygiene using diluted soapy water, pat dry and covering with a nonadherent if at risk for contamination, may use Tylenol or ibuprofen as needed for discomfort, given strict precautions for signs of infection and to return urgent care for further evaluation Final Clinical Impressions(s) / UC Diagnoses   Final diagnoses:  None   Discharge Instructions   None    ED Prescriptions   None    PDMP not reviewed this encounter.   Valinda HoarWhite, Courtnay Petrilla R, TexasNP 07/19/21 [phone]

## 2021-07-19 NOTE — ED Triage Notes (Signed)
Patient cut his finger while cutting weed fabric -- Right hand middle finger.   This happened yesterday @ about 5:30pm

## 2021-07-20 ENCOUNTER — Encounter: Payer: Managed Care, Other (non HMO) | Admitting: Family Medicine

## 2021-07-26 ENCOUNTER — Other Ambulatory Visit: Payer: Self-pay | Admitting: Family Medicine

## 2021-07-26 ENCOUNTER — Encounter: Payer: Self-pay | Admitting: Family Medicine

## 2021-07-26 ENCOUNTER — Ambulatory Visit (INDEPENDENT_AMBULATORY_CARE_PROVIDER_SITE_OTHER): Payer: Managed Care, Other (non HMO) | Admitting: Family Medicine

## 2021-07-26 VITALS — BP 127/80 | HR 57 | Temp 98.8°F | Resp 16 | Ht 72.0 in | Wt 331.6 lb

## 2021-07-26 DIAGNOSIS — E119 Type 2 diabetes mellitus without complications: Secondary | ICD-10-CM

## 2021-07-26 DIAGNOSIS — Z1159 Encounter for screening for other viral diseases: Secondary | ICD-10-CM

## 2021-07-26 DIAGNOSIS — I10 Essential (primary) hypertension: Secondary | ICD-10-CM | POA: Diagnosis not present

## 2021-07-26 DIAGNOSIS — H6122 Impacted cerumen, left ear: Secondary | ICD-10-CM

## 2021-07-26 DIAGNOSIS — Z23 Encounter for immunization: Secondary | ICD-10-CM

## 2021-07-26 DIAGNOSIS — Z125 Encounter for screening for malignant neoplasm of prostate: Secondary | ICD-10-CM

## 2021-07-26 DIAGNOSIS — Z0000 Encounter for general adult medical examination without abnormal findings: Secondary | ICD-10-CM | POA: Diagnosis not present

## 2021-07-26 NOTE — Patient Instructions (Signed)
.   Please review the attached list of medications and notify my office if there are any errors.   The CDC recommends two doses of Shingrix (the shingles vaccine) separated by 2 to 6 months for adults age 51 years and older. I recommend checking with your pharmacy plan regarding coverage for this vaccine.  .    

## 2021-07-26 NOTE — Progress Notes (Unsigned)
I,Jana Robinson,acting as a scribe for Lelon Huh, MD.,have documented all relevant documentation on the behalf of Lelon Huh, MD,as directed by  Lelon Huh, MD while in the presence of Lelon Huh, MD.   Complete physical exam   Patient: [name]   DOB: [birth year]   51 y.o. Male  MRN: [id number] Visit Date: 07/26/2021  Today's healthcare provider: Lelon Huh, MD   Chief Complaint  Patient presents with   Annual Exam   Subjective    [name] is a 51 y.o. male who presents today for a complete physical exam.  He reports consuming a general diet. The patient does not participate in regular exercise at present. Walks sometimes and gardens. He generally feels well. He reports sleeping well. He does not have additional problems to discuss today.  HPI HPI   ER f/u  Last edited by Alanson Puls, CMA on 07/26/2021  8:47 AM.      Last Colonoscopy: 03/28/2021 Last TD: 11/15/2017  Follow up ER visit  Patient was seen in ER for  on 07-18-21. He was treated for laceration of middle right finger. Gardening and cutting weed control fibric. Profuse bleeding per patient. Applied liquid bandaging and wrapped.    Treatment for this included. Nine hr wait.  Patient left.   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 Arnoldo Morale, [place]   COLONOSCOPY N/A 03/28/2021   Procedure: COLONOSCOPY;  Surgeon: Lucilla Lame, MD;  Location: [facility];  Service: Endoscopy;  Laterality: N/A;   KNEE SURGERY  2006   left knee reconstruction after tibial fracture   LEG SURGERY  1975   leg and foot  surgery; right lower leg circa   POLYPECTOMY  03/28/2021   Procedure: POLYPECTOMY;  Surgeon: Lucilla Lame, MD;  Location: [facility];  Service: Endoscopy;;   Social History   Socioeconomic History   Marital status: Married    Spouse name: Not on file   Number of children: 0   Years of education: Not on file   Highest education level: Not on file  Occupational History   Occupation: [employer]    Comment: Employed  Tobacco Use   Smoking status: Never   Smokeless tobacco: Never  Vaping Use   Vaping Use: Never used  Substance and Sexual Activity   Alcohol use: No    Alcohol/week: 0.0 standard drinks   Drug use: No   Sexual activity: Yes    Birth control/protection: None  Other Topics Concern   Not on file  Social History Narrative   Not on file   Social Determinants of Health   Financial Resource Strain: Not on file  Food Insecurity: Not on file  Transportation Needs: Not on file  Physical Activity: Not on file  Stress: Not on file  Social Connections: Not on file  Intimate Partner Violence: Not on file   Family Status  Relation Name Status   Mother  Alive   Father  Deceased at age 88       Cause of Death: Stomach and pancreatic cancer   Sister HALF Alive   Brother HALF Alive   Other Gen. family  hx (Not Specified)   Neg Hx  (Not Specified)   Family History  Problem Relation Age of Onset   Panic disorder Mother    Diabetes Mother    Alcohol abuse Father    Pancreatic cancer Father    Stomach cancer Father    Lung cancer Father    Drug abuse Sister    Healthy Brother    Prostate cancer Other    Lung cancer Other    Anxiety disorder Other    Colon cancer Neg Hx    Heart disease Neg Hx    Allergies  Allergen Reactions   Doxycycline Rash   Amlodipine Besylate     Constipation, palitations, swelling, shortness of breath   Augmentin [Amoxicillin-Pot Clavulanate]     intolerant: Causes insomnia   Indomethacin Swelling    Caused Fluid  retention   Keflex  [Cephalexin]     intolerant   Cyclobenzaprine Palpitations    Patient Care Team: Birdie Sons, MD as PCP - General (Family Medicine) Elvina Mattes, Rodman Key, DPM (Inactive) as Attending Physician (Podiatry) Anell Barr, OD (Optometry)   Medications: Outpatient Medications Prior to Visit  Medication Sig   acetaminophen (TYLENOL) 500 MG tablet Take 500 mg by mouth every 6 (six) hours as needed.   acetaminophen (TYLENOL) 650 MG CR tablet Tylenol Arthritis 650 mg tablet,extended release   2 tablets every day by oral route.   fluticasone (FLONASE) 50 MCG/ACT nasal spray Place 2 sprays into both nostrils daily.   hydrochlorothiazide (MICROZIDE) 12.5 MG capsule TAKE 1 CAPSULE(12.5 MG) BY MOUTH DAILY   ibuprofen (ADVIL) 800 MG tablet TAKE 1 TABLET BY MOUTH TWICE DAILY AS NEEDED   irbesartan-hydrochlorothiazide (AVALIDE) 300-12.5 MG tablet TAKE 1 TABLET BY MOUTH DAILY   loratadine (CLARITIN) 10 MG tablet Take 10 mg by mouth daily.   metFORMIN (GLUCOPHAGE-XR) 500 MG 24 hr tablet TAKE 2 TABLETS BY MOUTH DAILY   ONE TOUCH ULTRA TEST test strip USE UP TO 3 TIMES DAILY AS DIRECTED   Probiotic Product (PROBIOTIC DAILY PO) Take by mouth.   sertraline (ZOLOFT) 50 MG tablet TAKE 1 TABLET(50 MG) BY MOUTH DAILY   colchicine 0.6 MG tablet First day: Take 2 tablets.  Wait 1 hour and take 1 more tablet. Days 2-5. Take one tablet daily. (Patient not taking: Reported on 07/26/2021)   meclizine (ANTIVERT) 25 MG tablet Take 1 tablet (25 mg total) by mouth 3 (three) times daily as needed for dizziness. (Patient not taking: Reported on 07/26/2021)   psyllium (REGULOID) 0.52 g capsule Take 0.52 g by mouth daily. (Patient not taking: Reported on 07/26/2021)   No facility-administered medications prior to visit.    Review of Systems  Eyes:  Positive for itching.  Musculoskeletal:  Positive for arthralgias, back pain, myalgias, neck pain and neck stiffness.  Neurological:  Positive for headaches.   All other systems reviewed and are negative.  {Labs  Heme  Chem  Endocrine  Serology  Results Review (optional):23779}  Objective     BP 127/80 (BP Location: Left Arm, Patient Position: Sitting, Cuff Size: Large)   Pulse (!) 57   Temp 98.8 F (37.1 C) (Oral)   Resp 16   Ht 6' (1.829 m)   Wt (!) 331 lb 9.6 oz (150.4 kg) Comment: reported by pt/refused wt at office  SpO2 97%   BMI 44.97 kg/m  {Show previous vital signs (optional):23777}   Physical Exam   General Appearance:    Severely obese male. Alert, cooperative, in no acute  distress, appears stated age  Head:    Normocephalic, without obvious abnormality, atraumatic  Eyes:    PERRL, conjunctiva/corneas clear, EOM's intact, fundi    benign, both eyes       Ears:    Excessive cerumen right ear canal. Left canal clear. Left TM normal  Nose:   Nares normal, septum midline, mucosa normal, no drainage   or sinus tenderness  Throat:   Lips, mucosa, and tongue normal; teeth and gums normal  Neck:   Supple, symmetrical, trachea midline, no adenopathy;       thyroid:  No enlargement/tenderness/nodules; no carotid   bruit or JVD  Back:     Symmetric, no curvature, ROM normal, no CVA tenderness  Lungs:     Clear to auscultation bilaterally, respirations unlabored  Chest wall:    No tenderness or deformity  Heart:    Bradycardic. Normal rhythm. No murmurs, rubs, or gallops.  S1 and S2 normal  Abdomen:     Soft, non-tender, bowel sounds active all four quadrants,    no masses, no organomegaly  Genitalia:    deferred  Rectal:    deferred  Extremities:   All extremities are intact. No cyanosis or edema  Pulses:   2+ and symmetric all extremities  Skin:   Skin color, texture, turgor normal, no rashes or lesions  Lymph nodes:   Cervical, supraclavicular, and axillary nodes normal  Neurologic:   CNII-XII intact. Normal strength, sensation and reflexes      throughout     Last depression screening scores    07/23/2020    9:13  AM 07/18/2019    9:18 AM 07/15/2018    9:09 AM  PHQ 2/9 Scores  PHQ - 2 Score '1 1 1  ' PHQ- 9 Score '3 3 5   ' Last fall risk screening    07/23/2020    9:14 AM  Fall Risk   Falls in the past year? 1  Number falls in past yr: 0  Injury with Fall? 0  Follow up Falls evaluation completed   Last Audit-C alcohol use screening    07/23/2020    9:14 AM  Alcohol Use Disorder Test (AUDIT)  1. How often do you have a drink containing alcohol? 0  2. How many drinks containing alcohol do you have on a typical day when you are drinking? 0  3. How often do you have six or more drinks on one occasion? 0  AUDIT-C Score 0   A score of 3 or more in women, and 4 or more in men indicates increased risk for alcohol abuse, EXCEPT if all of the points are from question 1   No results found for any visits on 07/26/21.  Assessment & Plan    Routine Health Maintenance and Physical Exam  Exercise Activities and Dietary recommendations  Goals   None     Immunization History  Administered Date(s) Administered   Influenza-Unspecified 11/28/2014, 12/11/2016, 12/10/2019, 12/12/2020   MMR 02/21/2017, 03/23/2017   Moderna Sars-Covid-2 Vaccination 01/19/2020   PFIZER(Purple Top)SARS-COV-2 Vaccination 03/31/2019, 04/21/2019   PPD Test 02/26/2017   Pneumococcal Polysaccharide-23 08/05/2008, 01/08/2013   Td 11/18/1998, 11/15/2017   Tdap 10/02/2007    Health Maintenance  Topic Date Due   FOOT EXAM  Never done   HIV Screening  Never done   Hepatitis C Screening  Never done   COVID-19 Vaccine (4 - Booster for Pfizer series) 03/15/2020   Zoster Vaccines- Shingrix (1 of 2) Never done   HEMOGLOBIN A1C  07/19/2021   INFLUENZA VACCINE  09/27/2021   OPHTHALMOLOGY EXAM  06/08/2022   TETANUS/TDAP  11/16/2027   COLONOSCOPY (Pts 45-61yr Insurance coverage will need to be confirmed)  03/29/2031   HPV VACCINES  Aged Out    Discussed health benefits of physical activity, and encouraged him to engage in regular  exercise appropriate for his age and condition.   2. Screening for prostate cancer   3. Prostate cancer screening  - PSA  4. Need for hepatitis C screening test  - Hepatitis C antibody  5. Need for shingles vaccine He is interested in vaccine, but defers to get another time.   6. Excessive cerumen in left ear canal He has some OTC ear wax drops he is going to use.   7. [place], morbid [place] Encouraged to work on increasing physical activity and healthier diet.   8. Type 2 diabetes mellitus without complication, without long-term current use of insulin (HCC)  - Lipid panel - TSH - Comprehensive Metabolic Panel (CMET) - Hemoglobin A1c - Urine microalbumin-creatinine with uACR - CBC  9. Primary hypertension Well controlled.  Continue current medications.   - EKG 12-Lead     The entirety of the information documented in the History of Present Illness, Review of Systems and Physical Exam were personally obtained by me. Portions of this information were initially documented by the CMA and reviewed by me for thoroughness and accuracy.     DLelon Huh MD  [phone](phone) [phone](fax)  [place]

## 2021-07-27 ENCOUNTER — Encounter: Payer: Self-pay | Admitting: Family Medicine

## 2021-07-27 DIAGNOSIS — E119 Type 2 diabetes mellitus without complications: Secondary | ICD-10-CM

## 2021-07-27 LAB — COMPREHENSIVE METABOLIC PANEL
ALT: 32 IU/L (ref 0–44)
AST: 24 IU/L (ref 0–40)
Albumin/Globulin Ratio: 1.8 (ref 1.2–2.2)
Albumin: 4.4 g/dL (ref 4.0–5.0)
Alkaline Phosphatase: 55 IU/L (ref 44–121)
BUN/Creatinine Ratio: 15 (ref 9–20)
BUN: 16 mg/dL (ref 6–24)
Bilirubin Total: 0.8 mg/dL (ref 0.0–1.2)
CO2: 26 mmol/L (ref 20–29)
Calcium: 9.4 mg/dL (ref 8.7–10.2)
Chloride: 101 mmol/L (ref 96–106)
Creatinine, Ser: 1.09 mg/dL (ref 0.76–1.27)
Globulin, Total: 2.4 g/dL (ref 1.5–4.5)
Glucose: 183 mg/dL — ABNORMAL HIGH (ref 70–99)
Potassium: 4.5 mmol/L (ref 3.5–5.2)
Sodium: 138 mmol/L (ref 134–144)
Total Protein: 6.8 g/dL (ref 6.0–8.5)
eGFR: 83 mL/min/{1.73_m2} (ref >59)

## 2021-07-27 LAB — PSA: Prostate Specific Ag, Serum: 0.5 ng/mL (ref 0.0–4.0)

## 2021-07-27 LAB — CBC
Hematocrit: 41.2 % (ref 37.5–51.0)
Hemoglobin: 14 g/dL (ref 13.0–17.7)
MCH: 31 pg (ref 26.6–33.0)
MCHC: 34 g/dL (ref 31.5–35.7)
MCV: 91 fL (ref 79–97)
Platelets: 234 10*3/uL (ref 150–450)
RBC: 4.52 x10E6/uL (ref 4.14–5.80)
RDW: 13.1 % (ref 11.6–15.4)
WBC: 7.9 10*3/uL (ref 3.4–10.8)

## 2021-07-27 LAB — MICROALBUMIN / CREATININE URINE RATIO
Creatinine, Urine: 172 mg/dL
Microalb/Creat Ratio: 4 mg/g creat (ref 0–29)
Microalbumin, Urine: 7.7 ug/mL

## 2021-07-27 LAB — SPECIMEN STATUS REPORT

## 2021-07-27 LAB — TSH: TSH: 4.34 u[IU]/mL (ref 0.450–4.500)

## 2021-07-27 LAB — HEMOGLOBIN A1C
Est. average glucose Bld gHb Est-mCnc: 194 mg/dL
Hgb A1c MFr Bld: 8.4 % — ABNORMAL HIGH (ref 4.8–5.6)

## 2021-07-27 LAB — HEPATITIS C ANTIBODY: Hep C Virus Ab: NONREACTIVE

## 2021-07-27 LAB — LIPID PANEL
Chol/HDL Ratio: 2.8 ratio (ref 0.0–5.0)
Cholesterol, Total: 153 mg/dL (ref 100–199)
HDL: 55 mg/dL (ref >39)
LDL Chol Calc (NIH): 83 mg/dL (ref 0–99)
Triglycerides: 78 mg/dL (ref 0–149)
VLDL Cholesterol Cal: 15 mg/dL (ref 5–40)

## 2021-08-01 MED ORDER — PIOGLITAZONE HCL 30 MG PO TABS: 30.0000 mg | ORAL_TABLET | Freq: Every day | ORAL | 1 refills | Status: DC

## 2021-08-02 ENCOUNTER — Other Ambulatory Visit: Payer: Self-pay

## 2021-08-02 MED ORDER — ONETOUCH ULTRA VI STRP: ORAL_STRIP | 12 refills | Status: DC

## 2021-08-02 MED ORDER — ONETOUCH ULTRA VI STRP: ORAL_STRIP | 12 refills | Status: AC

## 2021-08-02 NOTE — Addendum Note (Signed)
Addended by: Randal Buba on: 08/02/2021 05:32 PM   Modules accepted: Orders

## 2021-11-13 ENCOUNTER — Other Ambulatory Visit: Payer: Self-pay | Admitting: Family Medicine

## 2021-11-13 DIAGNOSIS — I10 Essential (primary) hypertension: Secondary | ICD-10-CM

## 2022-01-22 ENCOUNTER — Other Ambulatory Visit: Payer: Self-pay | Admitting: Family Medicine

## 2022-01-22 DIAGNOSIS — E119 Type 2 diabetes mellitus without complications: Secondary | ICD-10-CM

## 2022-05-21 ENCOUNTER — Other Ambulatory Visit: Payer: Self-pay | Admitting: Family Medicine

## 2022-05-21 DIAGNOSIS — E119 Type 2 diabetes mellitus without complications: Secondary | ICD-10-CM

## 2022-05-22 NOTE — Telephone Encounter (Signed)
Unable to refill per protocol, Rx request is too soon. Last refill 04/21/21 for 90 and 4 refills.  Requested Prescriptions  Pending Prescriptions Disp Refills   irbesartan-hydrochlorothiazide (AVALIDE) 300-12.5 MG tablet [Pharmacy Med Name: IRBESARTAN/HCTZ 300-12.5MG  TABLETS] 90 tablet 4    Sig: TAKE 1 TABLET BY MOUTH DAILY     Cardiovascular: ARB + Diuretic Combos Failed - 05/21/2022  8:45 AM      Failed - K in normal range and within 180 days    Potassium  Date Value Ref Range Status  07/26/2021 4.5 3.5 - 5.2 mmol/L Final  12/09/2013 3.6 3.5 - 5.1 mmol/L Final         Failed - Na in normal range and within 180 days    Sodium  Date Value Ref Range Status  07/26/2021 138 134 - 144 mmol/L Final  12/09/2013 141 136 - 145 mmol/L Final         Failed - Cr in normal range and within 180 days    Creatinine  Date Value Ref Range Status  12/09/2013 1.07 0.60 - 1.30 mg/dL Final   Creatinine, Ser  Date Value Ref Range Status  07/26/2021 1.09 0.76 - 1.27 mg/dL Final   Creatinine, POC  Date Value Ref Range Status  09/15/2015 n/a mg/dL Final         Failed - eGFR is 10 or above and within 180 days    EGFR (African American)  Date Value Ref Range Status  12/09/2013 >60 >75mL/min Final  09/10/2013 >60  Final   GFR calc Af Amer  Date Value Ref Range Status  07/18/2019 104 >59 mL/min/1.73 Final    Comment:    **[facility] currently reports eGFR in compliance with the current**   recommendations of the [employer]. [facility] will   update reporting as new guidelines are published from the NKF-ASN   Task force.    EGFR (Non-African Amer.)  Date Value Ref Range Status  12/09/2013 >60 >49mL/min Final    Comment:    eGFR values <54mL/min/1.73 m2 may be an indication of chronic kidney disease (CKD). Calculated eGFR, using the MRDR Study equation, is useful in  patients with stable renal function. The eGFR calculation will not be reliable in acutely ill patients when  serum creatinine is changing rapidly. It is not useful in patients on dialysis. The eGFR calculation may not be applicable to patients at the low and high extremes of body sizes, pregnant women, and vegetarians.   09/10/2013 >60  Final    Comment:    eGFR values <71mL/min/1.73 m2 may be an indication of chronic kidney disease (CKD). Calculated eGFR is useful in patients with stable renal function. The eGFR calculation will not be reliable in acutely ill patients when serum creatinine is changing rapidly. It is not useful in  patients on dialysis. The eGFR calculation may not be applicable to patients at the low and high extremes of body sizes, pregnant women, and vegetarians.    GFR, Estimated  Date Value Ref Range Status  10/09/2020 >60 >60 mL/min Final    Comment:    (NOTE) Calculated using the CKD-EPI Creatinine Equation (2021)    eGFR  Date Value Ref Range Status  07/26/2021 83 >59 mL/min/1.73 Final         Failed - Valid encounter within last 6 months    Recent Outpatient Visits           10 months ago Annual physical exam   [place]  Birdie Sons, MD   1 year ago Type 2 diabetes mellitus without complication, without long-term current use of insulin ([place])   [place], Donald E, MD   1 year ago Pyelonephritis   [place], Donald E, MD   1 year ago [place] Jon Billings, NP   1 year ago Essential (primary) hypertension   [place], Donald E, MD       Future Appointments             In 2 months Fisher, Kirstie Peri, MD [facility], [place] - Patient is not pregnant      Passed - Last BP in normal range    BP Readings from Last 1 Encounters:  07/26/21 127/80          metFORMIN (GLUCOPHAGE-XR) 500 MG 24 hr tablet [Pharmacy Med Name:  METFORMIN ER 500MG  24HR TABS] 180 tablet 4    Sig: TAKE 2 TABLETS BY MOUTH DAILY     Endocrinology:  Diabetes - Biguanides Failed - 05/21/2022  8:45 AM      Failed - HBA1C is between 0 and 7.9 and within 180 days    Hgb A1c MFr Bld  Date Value Ref Range Status  07/26/2021 8.4 (H) 4.8 - 5.6 % Final    Comment:             Prediabetes: 5.7 - 6.4          Diabetes: >6.4          Glycemic control for adults with diabetes: <7.0          Failed - B12 Level in normal range and within 720 days    No results found for: "VITAMINB12"       Failed - Valid encounter within last 6 months    Recent Outpatient Visits           10 months ago Annual physical exam   [place], Donald E, MD   1 year ago Type 2 diabetes mellitus without complication, without long-term current use of insulin ([place])   [place], Donald E, MD   1 year ago Pyelonephritis   [place], Donald E, MD   1 year ago [place] Jon Billings, NP   1 year ago Essential (primary) hypertension   [place], Donald E, MD       Future Appointments             In 2 months Fisher, Kirstie Peri, MD [place]            Failed - CBC within normal limits and completed in the last 12 months    WBC  Date Value Ref Range Status  07/26/2021 7.9 3.4 - 10.8 x10E3/uL Final  10/09/2020 7.8 4.0 - 10.5 K/uL Final   RBC  Date Value Ref Range Status  07/26/2021 4.52 4.14 - 5.80 x10E6/uL Final  10/09/2020 4.53 4.22 - 5.81 MIL/uL Final   Hemoglobin  Date Value Ref Range Status  07/26/2021 14.0 13.0 - 17.7 g/dL Final   Hematocrit  Date Value Ref Range Status  07/26/2021 41.2 37.5 - 51.0 % Final   MCHC  Date Value Ref Range Status  07/26/2021 34.0 31.5 - 35.7 g/dL Final  10/09/2020 35.0 30.0 - 36.0 g/dL Final    [facility]  Date Value Ref Range Status  07/26/2021 31.0 26.6 - 33.0 pg Final  10/09/2020 31.3 26.0 - 34.0 pg Final   MCV  Date Value Ref Range Status  07/26/2021 91 79 - 97 fL Final  12/09/2013 90 80 - 100 fL Final   No results found for: "PLTCOUNTKUC", "LABPLAT", "POCPLA" RDW  Date Value Ref Range Status  07/26/2021 13.1 11.6 - 15.4 % Final  12/09/2013 13.0 11.5 - 14.5 % Final         Passed - Cr in normal range and within 360 days    Creatinine  Date Value Ref Range Status  12/09/2013 1.07 0.60 - 1.30 mg/dL Final   Creatinine, Ser  Date Value Ref Range Status  07/26/2021 1.09 0.76 - 1.27 mg/dL Final   Creatinine, POC  Date Value Ref Range Status  09/15/2015 n/a mg/dL Final         Passed - eGFR in normal range and within 360 days    EGFR (African American)  Date Value Ref Range Status  12/09/2013 >60 >6mL/min Final  09/10/2013 >60  Final   GFR calc Af Amer  Date Value Ref Range Status  07/18/2019 104 >59 mL/min/1.73 Final    Comment:    **[facility] currently reports eGFR in compliance with the current**   recommendations of the [employer]. [facility] will   update reporting as new guidelines are published from the NKF-ASN   Task force.    EGFR (Non-African Amer.)  Date Value Ref Range Status  12/09/2013 >60 >60mL/min Final    Comment:    eGFR values <52mL/min/1.73 m2 may be an indication of chronic kidney disease (CKD). Calculated eGFR, using the MRDR Study equation, is useful in  patients with stable renal function. The eGFR calculation will not be reliable in acutely ill patients when serum creatinine is changing rapidly. It is not useful in patients on dialysis. The eGFR calculation may not be applicable to patients at the low and high extremes of body sizes, pregnant women, and vegetarians.   09/10/2013 >60  Final    Comment:    eGFR values <67mL/min/1.73 m2 may be an indication of chronic kidney disease (CKD). Calculated eGFR is  useful in patients with stable renal function. The eGFR calculation will not be reliable in acutely ill patients when serum creatinine is changing rapidly. It is not useful in  patients on dialysis. The eGFR calculation may not be applicable to patients at the low and high extremes of body sizes, pregnant women, and vegetarians.    GFR, Estimated  Date Value Ref Range Status  10/09/2020 >60 >60 mL/min Final    Comment:    (NOTE) Calculated using the CKD-EPI Creatinine Equation (2021)    eGFR  Date Value Ref Range Status  07/26/2021 83 >59 mL/min/1.73 Final

## 2022-05-25 ENCOUNTER — Other Ambulatory Visit: Payer: Self-pay | Admitting: Family Medicine

## 2022-05-25 DIAGNOSIS — E119 Type 2 diabetes mellitus without complications: Secondary | ICD-10-CM

## 2022-05-26 ENCOUNTER — Encounter: Payer: Self-pay | Admitting: Family Medicine

## 2022-05-26 NOTE — Telephone Encounter (Signed)
Requested Prescriptions  Pending Prescriptions Disp Refills   metFORMIN (GLUCOPHAGE-XR) 500 MG 24 hr tablet [Pharmacy Med Name: METFORMIN ER 500MG  24HR TABS] 180 tablet 4    Sig: TAKE 2 TABLETS BY MOUTH DAILY     Endocrinology:  Diabetes - Biguanides Failed - 05/25/2022  2:19 PM      Failed - HBA1C is between 0 and 7.9 and within 180 days    Hgb A1c MFr Bld  Date Value Ref Range Status  07/26/2021 8.4 (H) 4.8 - 5.6 % Final    Comment:             Prediabetes: 5.7 - 6.4          Diabetes: >6.4          Glycemic control for adults with diabetes: <7.0          Failed - B12 Level in normal range and within 720 days    No results found for: "VITAMINB12"       Failed - Valid encounter within last 6 months    Recent Outpatient Visits           10 months ago Annual physical exam   [place], Donald E, MD   1 year ago Type 2 diabetes mellitus without complication, without long-term current use of insulin ([place])   [place], Donald E, MD   1 year ago Pyelonephritis   [place], Donald E, MD   1 year ago [place] Jon Billings, NP   1 year ago Essential (primary) hypertension   [place], Donald E, MD       Future Appointments             In 2 months Fisher, Kirstie Peri, MD [place]            Failed - CBC within normal limits and completed in the last 12 months    WBC  Date Value Ref Range Status  07/26/2021 7.9 3.4 - 10.8 x10E3/uL Final  10/09/2020 7.8 4.0 - 10.5 K/uL Final   RBC  Date Value Ref Range Status  07/26/2021 4.52 4.14 - 5.80 x10E6/uL Final  10/09/2020 4.53 4.22 - 5.81 MIL/uL Final   Hemoglobin  Date Value Ref Range Status  07/26/2021 14.0 13.0 - 17.7 g/dL Final   Hematocrit  Date Value Ref Range Status  07/26/2021 41.2 37.5 - 51.0 %  Final   MCHC  Date Value Ref Range Status  07/26/2021 34.0 31.5 - 35.7 g/dL Final  10/09/2020 35.0 30.0 - 36.0 g/dL Final   [facility]  Date Value Ref Range Status  07/26/2021 31.0 26.6 - 33.0 pg Final  10/09/2020 31.3 26.0 - 34.0 pg Final   MCV  Date Value Ref Range Status  07/26/2021 91 79 - 97 fL Final  12/09/2013 90 80 - 100 fL Final   No results found for: "PLTCOUNTKUC", "LABPLAT", "POCPLA" RDW  Date Value Ref Range Status  07/26/2021 13.1 11.6 - 15.4 % Final  12/09/2013 13.0 11.5 - 14.5 % Final         Passed - Cr in normal range and within 360 days    Creatinine  Date Value Ref Range Status  12/09/2013 1.07 0.60 - 1.30 mg/dL Final   Creatinine, Ser  Date Value Ref Range Status  07/26/2021 1.09 0.76 - 1.27 mg/dL Final  Creatinine, POC  Date Value Ref Range Status  09/15/2015 n/a mg/dL Final         Passed - eGFR in normal range and within 360 days    EGFR (African American)  Date Value Ref Range Status  12/09/2013 >60 >38mL/min Final  09/10/2013 >60  Final   GFR calc Af Amer  Date Value Ref Range Status  07/18/2019 104 >59 mL/min/1.73 Final    Comment:    **[facility] currently reports eGFR in compliance with the current**   recommendations of the [employer]. [facility] will   update reporting as new guidelines are published from the NKF-ASN   Task force.    EGFR (Non-African Amer.)  Date Value Ref Range Status  12/09/2013 >60 >42mL/min Final    Comment:    eGFR values <66mL/min/1.73 m2 may be an indication of chronic kidney disease (CKD). Calculated eGFR, using the MRDR Study equation, is useful in  patients with stable renal function. The eGFR calculation will not be reliable in acutely ill patients when serum creatinine is changing rapidly. It is not useful in patients on dialysis. The eGFR calculation may not be applicable to patients at the low and high extremes of body sizes, pregnant women, and vegetarians.   09/10/2013 >60  Final     Comment:    eGFR values <17mL/min/1.73 m2 may be an indication of chronic kidney disease (CKD). Calculated eGFR is useful in patients with stable renal function. The eGFR calculation will not be reliable in acutely ill patients when serum creatinine is changing rapidly. It is not useful in  patients on dialysis. The eGFR calculation may not be applicable to patients at the low and high extremes of body sizes, pregnant women, and vegetarians.    GFR, Estimated  Date Value Ref Range Status  10/09/2020 >60 >60 mL/min Final    Comment:    (NOTE) Calculated using the CKD-EPI Creatinine Equation (2021)    eGFR  Date Value Ref Range Status  07/26/2021 83 >59 mL/min/1.73 Final          irbesartan-hydrochlorothiazide (AVALIDE) 300-12.5 MG tablet [Pharmacy Med Name: IRBESARTAN/HCTZ 300-12.5MG  TABLETS] 90 tablet 4    Sig: TAKE 1 TABLET BY MOUTH DAILY     Cardiovascular: ARB + Diuretic Combos Failed - 05/25/2022  2:19 PM      Failed - K in normal range and within 180 days    Potassium  Date Value Ref Range Status  07/26/2021 4.5 3.5 - 5.2 mmol/L Final  12/09/2013 3.6 3.5 - 5.1 mmol/L Final         Failed - Na in normal range and within 180 days    Sodium  Date Value Ref Range Status  07/26/2021 138 134 - 144 mmol/L Final  12/09/2013 141 136 - 145 mmol/L Final         Failed - Cr in normal range and within 180 days    Creatinine  Date Value Ref Range Status  12/09/2013 1.07 0.60 - 1.30 mg/dL Final   Creatinine, Ser  Date Value Ref Range Status  07/26/2021 1.09 0.76 - 1.27 mg/dL Final   Creatinine, POC  Date Value Ref Range Status  09/15/2015 n/a mg/dL Final         Failed - eGFR is 10 or above and within 180 days    EGFR (African American)  Date Value Ref Range Status  12/09/2013 >60 >66mL/min Final  09/10/2013 >60  Final   GFR calc Af Wyvonnia Lora  Date Value Ref Range  Status  07/18/2019 104 >59 mL/min/1.73 Final    Comment:    **[facility] currently reports eGFR in  compliance with the current**   recommendations of the [employer]. [facility] will   update reporting as new guidelines are published from the NKF-ASN   Task force.    EGFR (Non-African Amer.)  Date Value Ref Range Status  12/09/2013 >60 >19mL/min Final    Comment:    eGFR values <16mL/min/1.73 m2 may be an indication of chronic kidney disease (CKD). Calculated eGFR, using the MRDR Study equation, is useful in  patients with stable renal function. The eGFR calculation will not be reliable in acutely ill patients when serum creatinine is changing rapidly. It is not useful in patients on dialysis. The eGFR calculation may not be applicable to patients at the low and high extremes of body sizes, pregnant women, and vegetarians.   09/10/2013 >60  Final    Comment:    eGFR values <34mL/min/1.73 m2 may be an indication of chronic kidney disease (CKD). Calculated eGFR is useful in patients with stable renal function. The eGFR calculation will not be reliable in acutely ill patients when serum creatinine is changing rapidly. It is not useful in  patients on dialysis. The eGFR calculation may not be applicable to patients at the low and high extremes of body sizes, pregnant women, and vegetarians.    GFR, Estimated  Date Value Ref Range Status  10/09/2020 >60 >60 mL/min Final    Comment:    (NOTE) Calculated using the CKD-EPI Creatinine Equation (2021)    eGFR  Date Value Ref Range Status  07/26/2021 83 >59 mL/min/1.73 Final         Failed - Valid encounter within last 6 months    Recent Outpatient Visits           10 months ago Annual physical exam   [place], Donald E, MD   1 year ago Type 2 diabetes mellitus without complication, without long-term current use of insulin ([place])   [place], Donald E, MD   1 year ago Pyelonephritis   [place], Donald  E, MD   1 year ago [place] Jon Billings, NP   1 year ago Essential (primary) hypertension   [place], MD       Future Appointments             In 2 months Fisher, Kirstie Peri, MD [facility] [place] - Patient is not pregnant      Passed - Last BP in normal range    BP Readings from Last 1 Encounters:  07/26/21 127/80

## 2022-05-26 NOTE — Telephone Encounter (Signed)
Pt is calling back to check on the status of his medication: metformin. Per pt his pharmacy is stating that they never received the refill request that was sent on 05/10/22. Pt would like for the medication request to be sent back to the pharmacy.    [facility] [id number], [place] AT [facility] OF [address] [facility] Phone: [phone]  Fax: [phone]      Please advise.

## 2022-05-26 NOTE — Telephone Encounter (Signed)
The patient states he does not have any further refills on his metFORMIN (GLUCOPHAGE-XR) 500 MG 24 hr tablet  and he has talked to the pharmacy and they state they are not showing any either. Please assist patient further. Please leave a message if patient does not answer.

## 2022-05-27 ENCOUNTER — Other Ambulatory Visit: Payer: Self-pay | Admitting: Family Medicine

## 2022-05-27 DIAGNOSIS — E119 Type 2 diabetes mellitus without complications: Secondary | ICD-10-CM

## 2022-05-28 ENCOUNTER — Other Ambulatory Visit: Payer: Self-pay | Admitting: Family Medicine

## 2022-05-29 ENCOUNTER — Other Ambulatory Visit: Payer: Self-pay

## 2022-05-29 DIAGNOSIS — E119 Type 2 diabetes mellitus without complications: Secondary | ICD-10-CM

## 2022-05-29 NOTE — Telephone Encounter (Signed)
Unable to refill per protocol, Rx request is too soon. Duplicate request.  Requested Prescriptions  Pending Prescriptions Disp Refills   metFORMIN (GLUCOPHAGE-XR) 500 MG 24 hr tablet [Pharmacy Med Name: METFORMIN ER 500MG  24HR TABS] 180 tablet 4    Sig: TAKE 2 TABLETS BY MOUTH DAILY     Endocrinology:  Diabetes - Biguanides Failed - 05/27/2022 10:28 AM      Failed - HBA1C is between 0 and 7.9 and within 180 days    Hgb A1c MFr Bld  Date Value Ref Range Status  07/26/2021 8.4 (H) 4.8 - 5.6 % Final    Comment:             Prediabetes: 5.7 - 6.4          Diabetes: >6.4          Glycemic control for adults with diabetes: <7.0          Failed - B12 Level in normal range and within 720 days    No results found for: "VITAMINB12"       Failed - Valid encounter within last 6 months    Recent Outpatient Visits           10 months ago Annual physical exam   [place], Donald E, MD   1 year ago Type 2 diabetes mellitus without complication, without long-term current use of insulin ([place])   [place], Donald E, MD   1 year ago Pyelonephritis   [place], Donald E, MD   1 year ago [place] Jon Billings, NP   1 year ago Essential (primary) hypertension   [place], Donald E, MD       Future Appointments             In 2 months Fisher, Kirstie Peri, MD [place]            Failed - CBC within normal limits and completed in the last 12 months    WBC  Date Value Ref Range Status  07/26/2021 7.9 3.4 - 10.8 x10E3/uL Final  10/09/2020 7.8 4.0 - 10.5 K/uL Final   RBC  Date Value Ref Range Status  07/26/2021 4.52 4.14 - 5.80 x10E6/uL Final  10/09/2020 4.53 4.22 - 5.81 MIL/uL Final   Hemoglobin  Date Value Ref Range Status  07/26/2021 14.0 13.0 - 17.7 g/dL Final    Hematocrit  Date Value Ref Range Status  07/26/2021 41.2 37.5 - 51.0 % Final   MCHC  Date Value Ref Range Status  07/26/2021 34.0 31.5 - 35.7 g/dL Final  10/09/2020 35.0 30.0 - 36.0 g/dL Final   [facility]  Date Value Ref Range Status  07/26/2021 31.0 26.6 - 33.0 pg Final  10/09/2020 31.3 26.0 - 34.0 pg Final   MCV  Date Value Ref Range Status  07/26/2021 91 79 - 97 fL Final  12/09/2013 90 80 - 100 fL Final   No results found for: "PLTCOUNTKUC", "LABPLAT", "POCPLA" RDW  Date Value Ref Range Status  07/26/2021 13.1 11.6 - 15.4 % Final  12/09/2013 13.0 11.5 - 14.5 % Final         Passed - Cr in normal range and within 360 days    Creatinine  Date Value Ref Range Status  12/09/2013 1.07 0.60 - 1.30 mg/dL Final   Creatinine, Ser  Date Value Ref  Range Status  07/26/2021 1.09 0.76 - 1.27 mg/dL Final   Creatinine, POC  Date Value Ref Range Status  09/15/2015 n/a mg/dL Final         Passed - eGFR in normal range and within 360 days    EGFR (African American)  Date Value Ref Range Status  12/09/2013 >60 >4mL/min Final  09/10/2013 >60  Final   GFR calc Af Amer  Date Value Ref Range Status  07/18/2019 104 >59 mL/min/1.73 Final    Comment:    **[facility] currently reports eGFR in compliance with the current**   recommendations of the [employer]. [facility] will   update reporting as new guidelines are published from the NKF-ASN   Task force.    EGFR (Non-African Amer.)  Date Value Ref Range Status  12/09/2013 >60 >72mL/min Final    Comment:    eGFR values <16mL/min/1.73 m2 may be an indication of chronic kidney disease (CKD). Calculated eGFR, using the MRDR Study equation, is useful in  patients with stable renal function. The eGFR calculation will not be reliable in acutely ill patients when serum creatinine is changing rapidly. It is not useful in patients on dialysis. The eGFR calculation may not be applicable to patients at the low and high  extremes of body sizes, pregnant women, and vegetarians.   09/10/2013 >60  Final    Comment:    eGFR values <39mL/min/1.73 m2 may be an indication of chronic kidney disease (CKD). Calculated eGFR is useful in patients with stable renal function. The eGFR calculation will not be reliable in acutely ill patients when serum creatinine is changing rapidly. It is not useful in  patients on dialysis. The eGFR calculation may not be applicable to patients at the low and high extremes of body sizes, pregnant women, and vegetarians.    GFR, Estimated  Date Value Ref Range Status  10/09/2020 >60 >60 mL/min Final    Comment:    (NOTE) Calculated using the CKD-EPI Creatinine Equation (2021)    eGFR  Date Value Ref Range Status  07/26/2021 83 >59 mL/min/1.73 Final

## 2022-05-30 MED ORDER — METFORMIN HCL ER 500 MG PO TB24: ORAL_TABLET | ORAL | 4 refills | Status: DC

## 2022-05-30 MED ORDER — IRBESARTAN-HYDROCHLOROTHIAZIDE 300-12.5 MG PO TABS: 1.0000 | ORAL_TABLET | Freq: Every day | ORAL | 3 refills | Status: DC

## 2022-06-18 ENCOUNTER — Other Ambulatory Visit: Payer: Self-pay | Admitting: Family Medicine

## 2022-06-18 DIAGNOSIS — F410 Panic disorder [episodic paroxysmal anxiety] without agoraphobia: Secondary | ICD-10-CM

## 2022-07-02 ENCOUNTER — Telehealth: Payer: Managed Care, Other (non HMO) | Admitting: Nurse Practitioner

## 2022-07-02 DIAGNOSIS — U071 COVID-19: Secondary | ICD-10-CM | POA: Diagnosis not present

## 2022-07-02 DIAGNOSIS — J069 Acute upper respiratory infection, unspecified: Secondary | ICD-10-CM | POA: Diagnosis not present

## 2022-07-02 MED ORDER — FLUTICASONE PROPIONATE 50 MCG/ACT NA SUSP: 2.0000 | Freq: Every day | NASAL | 0 refills | Status: DC

## 2022-07-02 NOTE — Progress Notes (Signed)
I have spent 5 minutes in review of e-visit questionnaire, review and updating patient chart, medical decision making and response to patient.  ° °Rosie Torrez W Daphane Odekirk, NP ° °  °

## 2022-07-02 NOTE — Progress Notes (Signed)
E-Visit for [employer] Virus / COVID Screening  Your current symptoms could be consistent with COVID.  Please complete a Covid test either at home or check with your local pharmacy to see if they provide testing.    You have tested positive for COVID-19, meaning that you were infected with the novel coronavirus and could give the virus to others.  Most people with COVID-19 have mild illness and can recover at home without medical care. Do not leave your home, except to get medical care. Do not visit public areas and do not go to places where you are unable to wear a mask. It is important that you stay home  to take care for yourself and to help protect other people in your home and community.      Isolation Instructions:   You are to isolate at home until you have been fever free for at least 24 hours without a fever-reducing medication, and symptoms have been steadily improving for 24 hours. At that time,  you can end isolation but need to mask for an additional 5 days.  If you must be around other household members who do not have symptoms, you need to make sure that both you and the family members are masking consistently with a high-quality mask.  If you note any worsening of symptoms despite treatment, please seek an in-person evaluation ASAP. If you note any significant shortness of breath or any chest pain, please seek ER evaluation. Please do not delay care!  Go to the nearest hospital ED for assessment if fever/cough/breathlessness are severe or illness seems like a threat to life.    The following symptoms may appear 2-14 days after exposure: Fever Cough Shortness of breath or difficulty breathing Chills Repeated shaking with chills Muscle pain Headache Sore throat New loss of taste or smell Fatigue Congestion or runny nose Nausea or vomiting Diarrhea  You can use medication such as prescription for Fluticasone nasal spray 2 sprays in each nostril one time per day  You may also  take acetaminophen (Tylenol) as needed for fever.  HOME CARE Only take medications as instructed by your medical team. Drink plenty of fluids and get plenty of rest. A steam or ultrasonic humidifier can help if you have congestion.  GET HELP RIGHT AWAY IF YOU HAVE EMERGENCY WARNING SIGNS.  Call 911 or proceed to your closest emergency facility if: You develop worsening high fever. Trouble breathing Bluish lips or face Persistent pain or pressure in the chest New confusion Inability to wake or stay awake You cough up blood. Your symptoms become more severe Inability to hold down food or fluids  This list is not all possible symptoms. Contact your medical provider for any symptoms that are severe or concerning to you.   Your e-visit answers were reviewed by a board certified advanced clinical practitioner to complete your personal care plan.  Depending on the condition, your plan could have included both over the counter or prescription medications.  If there is a problem, please reply once you have received a response from your provider.  Your safety is important to [place].  If you have drug allergies check your prescription carefully.    You can use MyChart to ask questions about today's visit, request a non-urgent call back, or ask for a work or school excuse for 24 hours related to this e-Visit. If it has been greater than 24 hours you will need to follow up with your provider or enter a new e-Visit  to address those concerns. You will get an e-mail in the next two days asking about your experience.  I hope that your e-visit has been valuable and will speed your recovery. Thank you for using e-visits.

## 2022-08-04 ENCOUNTER — Encounter: Payer: Self-pay | Admitting: Family Medicine

## 2022-08-04 ENCOUNTER — Ambulatory Visit (INDEPENDENT_AMBULATORY_CARE_PROVIDER_SITE_OTHER): Payer: Managed Care, Other (non HMO) | Admitting: Family Medicine

## 2022-08-04 VITALS — BP 132/77 | HR 59 | Ht 72.0 in | Wt 344.0 lb

## 2022-08-04 DIAGNOSIS — E119 Type 2 diabetes mellitus without complications: Secondary | ICD-10-CM | POA: Diagnosis not present

## 2022-08-04 DIAGNOSIS — I10 Essential (primary) hypertension: Secondary | ICD-10-CM

## 2022-08-04 DIAGNOSIS — Z0000 Encounter for general adult medical examination without abnormal findings: Secondary | ICD-10-CM | POA: Diagnosis not present

## 2022-08-04 DIAGNOSIS — Z125 Encounter for screening for malignant neoplasm of prostate: Secondary | ICD-10-CM

## 2022-08-04 NOTE — Progress Notes (Signed)
[name],acting as a Neurosurgeon for Micheal Merry, MD.,have documented all relevant documentation on the behalf of Micheal Merry, MD,as directed by  Micheal Merry, MD   Complete physical exam   Patient: [name]   DOB: 1970/11/25   52 y.o. Male  MRN: [id number] Visit Date: 08/04/2022  Today's healthcare provider: [name], MD   No chief complaint on file.  Subjective    [name] is a 52 y.o. male who presents today for a complete physical exam.  He reports consuming a  weight watchers  diet. The patient does not participate in regular exercise at present. He generally feels fairly well. He reports sleeping fairly well. He does not have additional problems to discuss today.  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 Lovell Sheehan, [facility]   COLONOSCOPY N/A 03/28/2021   Procedure: COLONOSCOPY;  Surgeon: Midge Minium, MD;  Location: [facility];  Service: Endoscopy;  Laterality: N/A;   KNEE SURGERY  2006   left knee reconstruction after tibial fracture   LEG SURGERY  1975   leg and foot surgery; right lower leg circa   POLYPECTOMY  03/28/2021   Procedure: POLYPECTOMY;  Surgeon: Midge Minium, MD;  Location: [facility];  Service: Endoscopy;;   Social History   Socioeconomic History   Marital status: Married    Spouse name: Not on file   Number of children: 0   Years of education: Not on file   Highest education level: Not on file  Occupational History   Occupation: [employer]    Comment: Employed  Tobacco Use   Smoking status: Never   Smokeless tobacco: Never  Vaping Use    Vaping Use: Never used  Substance and Sexual Activity   Alcohol use: No    Alcohol/week: 0.0 standard drinks of alcohol   Drug use: No   Sexual activity: Yes    Birth control/protection: None  Other Topics Concern   Not on file  Social History Narrative   Not on file   Social Determinants of Health   Financial Resource Strain: Not on file  Food Insecurity: Not on file  Transportation Needs: Not on file  Physical Activity: Not on file  Stress: Not on file  Social Connections: Not on file  Intimate Partner Violence: Not on file   Family Status  Relation Name Status   Mother  Alive   Father  Deceased at age 24       Cause of Death: Stomach and pancreatic cancer   Sister HALF Alive   Brother HALF Alive   Other Gen. family hx (Not Specified)   Neg Hx  (Not Specified)   Family History  Problem Relation Age of Onset   Panic disorder Mother    Diabetes Mother    Alcohol abuse Father    Pancreatic cancer Father    Stomach cancer Father    Lung cancer Father    Drug abuse Sister    Healthy Brother    Prostate cancer Other    Lung cancer Other    Anxiety disorder Other  Colon cancer Neg Hx    Heart disease Neg Hx    Allergies  Allergen Reactions   Doxycycline Rash   Amlodipine Besylate     Constipation, palitations, swelling, shortness of breath   Augmentin [Amoxicillin-Pot Clavulanate]     intolerant: Causes insomnia   Indomethacin Swelling    Caused Fluid retention   Keflex  [Cephalexin]     intolerant   Cyclobenzaprine Palpitations    Patient Care Team: Malva Limes, MD as PCP - General (Family Medicine) Orland Jarred, Molli Hazard, DPM (Inactive) as Attending Physician (Podiatry) Isla Pence, OD (Optometry)   Medications: Outpatient Medications Prior to Visit  Medication Sig   acetaminophen (TYLENOL) 500 MG tablet Take 500 mg by mouth every 6 (six) hours as needed.   acetaminophen (TYLENOL) 650 MG CR tablet Tylenol Arthritis 650 mg tablet,extended  release   2 tablets every day by oral route.   colchicine 0.6 MG tablet First day: Take 2 tablets.  Wait 1 hour and take 1 more tablet. Days 2-5. Take one tablet daily. (Patient not taking: Reported on 07/26/2021)   fluticasone (FLONASE) 50 MCG/ACT nasal spray Place 2 sprays into both nostrils daily.   glucose blood (ONETOUCH ULTRA) test strip Use as instructed.  For Diabetes E11.9   hydrochlorothiazide (MICROZIDE) 12.5 MG capsule TAKE 1 CAPSULE(12.5 MG) BY MOUTH DAILY   ibuprofen (ADVIL) 800 MG tablet TAKE 1 TABLET BY MOUTH TWICE DAILY AS NEEDED   irbesartan-hydrochlorothiazide (AVALIDE) 300-12.5 MG tablet Take 1 tablet by mouth daily.   loratadine (CLARITIN) 10 MG tablet Take 10 mg by mouth daily.   meclizine (ANTIVERT) 25 MG tablet Take 1 tablet (25 mg total) by mouth 3 (three) times daily as needed for dizziness. (Patient not taking: Reported on 07/26/2021)   metFORMIN (GLUCOPHAGE-XR) 500 MG 24 hr tablet TAKE 2 TABLETS BY MOUTH DAILY   pioglitazone (ACTOS) 30 MG tablet TAKE 1 TABLET(30 MG) BY MOUTH DAILY   Probiotic Product (PROBIOTIC DAILY PO) Take by mouth.   psyllium (REGULOID) 0.52 g capsule Take 0.52 g by mouth daily. (Patient not taking: Reported on 07/26/2021)   sertraline (ZOLOFT) 50 MG tablet TAKE 1 TABLET(50 MG) BY MOUTH DAILY   No facility-administered medications prior to visit.    Review of Systems  Neurological:  Positive for headaches.      Objective    BP 132/77 (BP Location: Left Arm, Patient Position: Sitting, Cuff Size: Large)   Pulse (!) 59   Ht 6' (1.829 m)   Wt (!) 344 lb (156 kg)   SpO2 100%   BMI 46.65 kg/m    Physical Exam   General Appearance:    Severely obese male. Alert, cooperative, in no acute distress, appears stated age  Head:    Normocephalic, without obvious abnormality, atraumatic  Eyes:    PERRL, conjunctiva/corneas clear, EOM's intact, fundi    benign, both eyes       Ears:    Normal TM's and external ear canals, both ears  Nose:    Nares normal, septum midline, mucosa normal, no drainage   or sinus tenderness  Throat:   Lips, mucosa, and tongue normal; teeth and gums normal  Neck:   Supple, symmetrical, trachea midline, no adenopathy;       thyroid:  No enlargement/tenderness/nodules; no carotid   bruit or JVD  Back:     Symmetric, no curvature, ROM normal, no CVA tenderness  Lungs:     Clear to auscultation bilaterally, respirations unlabored  Chest wall:  No tenderness or deformity  Heart:    Bradycardic. Normal rhythm. No murmurs, rubs, or gallops.  S1 and S2 normal  Abdomen:     Soft, non-tender, bowel sounds active all four quadrants,    no masses, no organomegaly  Genitalia:    deferred  Rectal:    deferred  Extremities:   All extremities are intact. No cyanosis or edema  Pulses:   2+ and symmetric all extremities  Skin:   Skin color, texture, turgor normal, no rashes or lesions  Lymph nodes:   Cervical, supraclavicular, and axillary nodes normal  Neurologic:   CNII-XII intact. Normal strength, sensation and reflexes      throughout     Last depression screening scores    07/26/2021    8:56 AM 07/23/2020    9:13 AM 07/18/2019    9:18 AM  PHQ 2/9 Scores  PHQ - 2 Score 1 1 1   PHQ- 9 Score 4 3 3    Last fall risk screening    07/26/2021    8:54 AM  Fall Risk   Falls in the past year? 0  Number falls in past yr: 0  Injury with Fall? 0  Follow up Falls evaluation completed   Last Audit-C alcohol use screening    07/26/2021    8:57 AM  Alcohol Use Disorder Test (AUDIT)  1. How often do you have a drink containing alcohol? 0  2. How many drinks containing alcohol do you have on a typical day when you are drinking? 0  3. How often do you have six or more drinks on one occasion? 0  AUDIT-C Score 0   A score of 3 or more in women, and 4 or more in men indicates increased risk for alcohol abuse, EXCEPT if all of the points are from question 1   No results found for any visits on 08/04/22.   Assessment & Plan    Routine Health Maintenance and Physical Exam  Exercise Activities and Dietary recommendations  Goals   None     Immunization History  Administered Date(s) Administered   Influenza-Unspecified 11/28/2014, 12/11/2016, 12/10/2019, 12/12/2020   MMR 02/21/2017, 03/23/2017   Moderna Sars-Covid-2 Vaccination 01/19/2020   PFIZER(Purple Top)SARS-COV-2 Vaccination 03/31/2019, 04/21/2019   PPD Test 02/26/2017   Pneumococcal Polysaccharide-23 08/05/2008, 01/08/2013   Td 11/18/1998, 11/15/2017   Tdap 10/02/2007    Health Maintenance  Topic Date Due   HIV Screening  Never done   Zoster Vaccines- Shingrix (1 of 2) Never done   COVID-19 Vaccine (4 - 2023-24 season) 10/28/2021   HEMOGLOBIN A1C  01/26/2022   OPHTHALMOLOGY EXAM  06/08/2022   Diabetic kidney evaluation - eGFR measurement  07/27/2022   Diabetic kidney evaluation - Urine ACR  07/27/2022   INFLUENZA VACCINE  09/28/2022   DTaP/Tdap/Td (4 - Td or Tdap) 11/16/2027   Colonoscopy  03/29/2031   Hepatitis C Screening  Completed   HPV VACCINES  Aged Out    Discussed health benefits of physical activity, and encouraged him to engage in regular exercise appropriate for his age and condition.   2. Obesity, morbid (HCC) Continue to work on healthy diet and exercise. Consider GLP-1 agonist. See how A1c looks for the time being.   3. Type 2 diabetes mellitus without complication, without long-term current use of insulin (HCC) Doing well on current medications.  - HgB A1c - Microalbumin / creatinine urine ratio  4. Prostate cancer screening  - PSA Total (Reflex To Free) ([facility] only)  5. Primary  hypertension Well controlled.  Continue current medications.   - Lipid Profile - Comprehensive metabolic panel - CBC with Differential/Platelet     The entirety of the information documented in the History of Present Illness, Review of Systems and Physical Exam were personally obtained by me. Portions of this  information were initially documented by the CMA and reviewed by me for thoroughness and accuracy.     Micheal Merry, MD  [facility] [phone] (phone) [phone] (fax)  [facility]

## 2022-08-05 LAB — SPECIMEN STATUS REPORT

## 2022-08-06 LAB — MICROALBUMIN / CREATININE URINE RATIO
Creatinine, Urine: 110.7 mg/dL
Microalb/Creat Ratio: 7 mg/g creat (ref 0–29)
Microalbumin, Urine: 7.7 ug/mL

## 2022-08-08 LAB — COMPREHENSIVE METABOLIC PANEL
ALT: 23 IU/L (ref 0–44)
AST: 21 IU/L (ref 0–40)
Albumin/Globulin Ratio: 1.5
Albumin: 4.1 g/dL (ref 3.8–4.9)
Alkaline Phosphatase: 54 IU/L (ref 44–121)
BUN/Creatinine Ratio: 15 (ref 9–20)
BUN: 17 mg/dL (ref 6–24)
Bilirubin Total: 0.7 mg/dL (ref 0.0–1.2)
CO2: 26 mmol/L (ref 20–29)
Calcium: 9.2 mg/dL (ref 8.7–10.2)
Chloride: 102 mmol/L (ref 96–106)
Creatinine, Ser: 1.14 mg/dL (ref 0.76–1.27)
Globulin, Total: 2.7 g/dL (ref 1.5–4.5)
Glucose: 114 mg/dL — ABNORMAL HIGH (ref 70–99)
Potassium: 4 mmol/L (ref 3.5–5.2)
Sodium: 142 mmol/L (ref 134–144)
Total Protein: 6.8 g/dL (ref 6.0–8.5)
eGFR: 78 mL/min/{1.73_m2} (ref >59)

## 2022-08-08 LAB — PSA TOTAL (REFLEX TO FREE): Prostate Specific Ag, Serum: 0.7 ng/mL (ref 0.0–4.0)

## 2022-08-08 LAB — LIPID PANEL
Chol/HDL Ratio: 3.6 ratio (ref 0.0–5.0)
Cholesterol, Total: 172 mg/dL (ref 100–199)
HDL: 48 mg/dL (ref >39)
LDL Chol Calc (NIH): 102 mg/dL — ABNORMAL HIGH (ref 0–99)
Triglycerides: 124 mg/dL (ref 0–149)
VLDL Cholesterol Cal: 22 mg/dL (ref 5–40)

## 2022-08-08 LAB — CBC WITH DIFFERENTIAL/PLATELET
Basophils Absolute: 0.1 10*3/uL (ref 0.0–0.2)
Basos: 1 %
EOS (ABSOLUTE): 0.3 10*3/uL (ref 0.0–0.4)
Eos: 5 %
Hematocrit: 42.8 % (ref 37.5–51.0)
Hemoglobin: 14.3 g/dL (ref 13.0–17.7)
Immature Grans (Abs): 0 10*3/uL (ref 0.0–0.1)
Immature Granulocytes: 0 %
Lymphocytes Absolute: 2 10*3/uL (ref 0.7–3.1)
Lymphs: 31 %
MCH: 31 pg (ref 26.6–33.0)
MCHC: 33.4 g/dL (ref 31.5–35.7)
MCV: 93 fL (ref 79–97)
Monocytes Absolute: 0.4 10*3/uL (ref 0.1–0.9)
Monocytes: 7 %
Neutrophils Absolute: 3.6 10*3/uL (ref 1.4–7.0)
Neutrophils: 56 %
Platelets: 218 10*3/uL (ref 150–450)
RBC: 4.62 x10E6/uL (ref 4.14–5.80)
RDW: 12.7 % (ref 11.6–15.4)
WBC: 6.4 10*3/uL (ref 3.4–10.8)

## 2022-08-08 LAB — HEMOGLOBIN A1C
Est. average glucose Bld gHb Est-mCnc: 105 mg/dL
Hgb A1c MFr Bld: 5.3 % (ref 4.8–5.6)

## 2022-08-25 ENCOUNTER — Encounter: Payer: Self-pay | Admitting: Family Medicine

## 2022-09-04 ENCOUNTER — Encounter: Payer: Self-pay | Admitting: Emergency Medicine

## 2022-09-04 ENCOUNTER — Emergency Department: Payer: Managed Care, Other (non HMO)

## 2022-09-04 ENCOUNTER — Emergency Department
Admission: EM | Admit: 2022-09-04 | Discharge: 2022-09-04 | Disposition: A | Discharge status: Home / Self Care | Payer: Managed Care, Other (non HMO) | Attending: Emergency Medicine | Admitting: Emergency Medicine

## 2022-09-04 ENCOUNTER — Other Ambulatory Visit: Payer: Self-pay

## 2022-09-04 DIAGNOSIS — S0003XA Contusion of scalp, initial encounter: Secondary | ICD-10-CM | POA: Diagnosis not present

## 2022-09-04 DIAGNOSIS — W01198A Fall on same level from slipping, tripping and stumbling with subsequent striking against other object, initial encounter: Secondary | ICD-10-CM | POA: Diagnosis not present

## 2022-09-04 DIAGNOSIS — S0990XA Unspecified injury of head, initial encounter: Secondary | ICD-10-CM

## 2022-09-04 NOTE — ED Triage Notes (Signed)
Pt presents ambulatory to triage via POV with complaints of a head injury tonight. Pt states that he dropped a garbage disposal on his head tonight. Pt was laying on his back under the sink trying to install the garbage disposal when it fell on his head. No LOC - not on thinners - no vision changes. No meds taken PTA - used ice PTA. A&Ox4 at this time. Denies N/V, CP or SOB.

## 2022-09-04 NOTE — ED Provider Notes (Signed)
[facility] Provider Note  Patient Contact: 10:04 PM (approximate)   History   Head Injury   HPI  [name] is a 52 y.o. male who presents the urgency department complaining of head injury.  Patient was trying to place a garbage disposal in the sink when the disposal hit him in the head.  No loss of consciousness.  Patient has a hematoma to the left frontal skull.  No open wounds.     Physical Exam   Triage Vital Signs: ED Triage Vitals [09/04/22 2044]  Enc Vitals Group     BP (!) 159/95     Pulse Rate 63     Resp 20     Temp 98.5 F (36.9 C)     Temp Source Oral     SpO2 98 %     Weight (!) 340 lb (154.2 kg)     Height 6' (1.829 m)     Head Circumference      Peak Flow      Pain Score      Pain Loc      Pain Edu?      Excl. in GC?     Most recent vital signs: Vitals:   09/04/22 2044  BP: (!) 159/95  Pulse: 63  Resp: 20  Temp: 98.5 F (36.9 C)  SpO2: 98%     General: Alert and in no acute distress. Eyes:  PERRL. EOMI. Head: Hematoma noted to the left frontal skull.  No open wounds.  No periorbital ecchymosis, battle signs, serosanguineous fluid drainage from the ears or nares.  Neck: No stridor. No cervical spine tenderness to palpation.  Cardiovascular:  Good peripheral perfusion Respiratory: Normal respiratory effort without tachypnea or retractions. Lungs CTAB. Musculoskeletal: Full range of motion to all extremities.  Neurologic:  No gross focal neurologic deficits are appreciated.  Cranial nerves grossly intact. Skin:   No rash noted Other:   ED Results / Procedures / Treatments   Labs (all labs ordered are listed, but only abnormal results are displayed) Labs Reviewed - No data to display   EKG     RADIOLOGY  I personally viewed, evaluated, and interpreted these images as part of my medical decision making, as well as reviewing the written report by the radiologist.  ED Provider Interpretation: No acute  intracranial hemorrhage.  CT HEAD WO CONTRAST ( )  Result Date: 09/04/2022 CLINICAL DATA:  Frontal head trauma, garbage disposal fell on his head while he was trying to install it. EXAM: CT HEAD WITHOUT CONTRAST TECHNIQUE: Contiguous axial images were obtained from the base of the skull through the vertex without intravenous contrast. RADIATION DOSE REDUCTION: This exam was performed according to the departmental dose-optimization program which includes automated exposure control, adjustment of the mA and/or kV according to patient size and/or use of iterative reconstruction technique. COMPARISON:  None Available. FINDINGS: Brain: No evidence of acute infarction, hemorrhage, hydrocephalus, extra-axial collection or mass lesion/mass effect. Vascular: No hyperdense vessel or unexpected calcification. Skull: There is a left frontal scalp contusion but no space-occupying scalp hematoma. The calvarium, skull base and orbits are intact. Sinuses/Orbits: No acute finding. Other: None. IMPRESSION: Left frontal scalp contusion without acute intracranial CT findings or depressed skull fractures. Electronically Signed   By: Almira Bar M.D.   On: 09/04/2022 21:23    PROCEDURES:  Critical Care performed: No  Procedures   MEDICATIONS ORDERED IN ED: Medications - No data to display   IMPRESSION / MDM /  ASSESSMENT AND PLAN / ED COURSE  I reviewed the triage vital signs and the nursing notes.                                 Differential diagnosis includes, but is not limited to, head injury, skull fracture, intracranial hemorrhage, scalp hematoma, scalp laceration  Patient's presentation is most consistent with acute presentation with potential threat to life or bodily function.   Patient's diagnosis is consistent with minor head injury.  Patient presents emergency department after having a garbage disposal fall and hit him in the head.  No loss of consciousness.  No open wounds.  Patient is  neurologically intact.  CT scan revealed no fracture, intracranial hemorrhage..  Patient is given ED precautions to return to the ED for any worsening or new symptoms.     FINAL CLINICAL IMPRESSION(S) / ED DIAGNOSES   Final diagnoses:  Injury of head, initial encounter  Hematoma of scalp, initial encounter     Rx / DC Orders   ED Discharge Orders     None        Note:  This document was prepared using Dragon voice recognition software and may include unintentional dictation errors.   Lanette Hampshire 09/04/22 2220    Jene Every, MD 09/06/22 [phone]

## 2022-11-16 ENCOUNTER — Encounter: Payer: Self-pay | Admitting: Family Medicine

## 2022-11-19 ENCOUNTER — Other Ambulatory Visit: Payer: Self-pay | Admitting: Family Medicine

## 2022-11-19 DIAGNOSIS — I10 Essential (primary) hypertension: Secondary | ICD-10-CM

## 2022-11-21 ENCOUNTER — Ambulatory Visit: Payer: Managed Care, Other (non HMO) | Admitting: Family Medicine

## 2022-11-21 VITALS — BP 133/82 | HR 52 | Temp 98.1°F | Resp 16 | Ht 72.0 in

## 2022-11-21 DIAGNOSIS — Z23 Encounter for immunization: Secondary | ICD-10-CM | POA: Diagnosis not present

## 2022-11-21 DIAGNOSIS — G43909 Migraine, unspecified, not intractable, without status migrainosus: Secondary | ICD-10-CM | POA: Insufficient documentation

## 2022-11-21 DIAGNOSIS — G43809 Other migraine, not intractable, without status migrainosus: Secondary | ICD-10-CM | POA: Diagnosis not present

## 2022-11-21 NOTE — Progress Notes (Signed)
Established patient visit   Patient: [name]   DOB: [birth year]   52 y.o. Male  MRN: [id number] Visit Date: 11/21/2022  Today's healthcare provider: [name], MD   Chief Complaint  Patient presents with   migraines    History of migraines since age 34.  Patient now needs an ADA form filled out for his work. He reports having migraines 2-3 times per month.  He states that he controls the migraines by having low lighting around him while at work.  He is now expected to perform a job that will have him in bright lights.   Subjective    Discussed the use of AI scribe software for clinical note transcription with the patient, who gave verbal consent to proceed.  History of Present Illness   The patient, a 52 year old with a history of migraines, presents with increased stress and depression due to significant changes at his workplace. 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 This situation has led to a significant increase in his stress levels, to the point of experiencing a breakdown. He is currently seeking counseling to manage this stress.  He has a long-standing history of migraines, dating back to childhood, with consistent triggers including bright lights, stress, and noise. The patient has always managed his work environment to minimize these triggers, maintaining a low light environment. However, his new role requires him to work in a brightly lit space, which has exacerbated his migraines. He is seeking formal accommodations at work for a low light environment to manage his migraines.  The patient is also actively seeking new employment and considering early retirement due to the current work situation. He has applied for multiple positions and is hopeful for a positive change soon.       Medications: Outpatient Medications Prior to Visit  Medication Sig    acetaminophen (TYLENOL) 500 MG tablet Take 500 mg by mouth every 6 (six) hours as needed.   acetaminophen (TYLENOL) 650 MG CR tablet Tylenol Arthritis 650 mg tablet,extended release   2 tablets every day by oral route.   colchicine 0.6 MG tablet First day: Take 2 tablets.  Wait 1 hour and take 1 more tablet. Days 2-5. Take one tablet daily.   fluticasone (FLONASE) 50 MCG/ACT nasal spray Place 2 sprays into both nostrils daily.   glucose blood (ONETOUCH ULTRA) test strip Use as instructed.  For Diabetes E11.9   hydrochlorothiazide (MICROZIDE) 12.5 MG capsule TAKE 1 CAPSULE(12.5 MG) BY MOUTH DAILY   ibuprofen (ADVIL) 800 MG tablet TAKE 1 TABLET BY MOUTH TWICE DAILY AS NEEDED   irbesartan-hydrochlorothiazide (AVALIDE) 300-12.5 MG tablet Take 1 tablet by mouth daily.   loratadine (CLARITIN) 10 MG tablet Take 10 mg by mouth daily.   meclizine (ANTIVERT) 25 MG tablet Take 1 tablet (25 mg total) by mouth 3 (three) times daily as needed for dizziness.   metFORMIN (GLUCOPHAGE-XR) 500 MG 24 hr tablet TAKE 2 TABLETS BY MOUTH DAILY   pioglitazone (ACTOS) 30 MG tablet TAKE 1 TABLET(30 MG) BY MOUTH DAILY   Probiotic Product (PROBIOTIC DAILY PO) Take by mouth.   psyllium (REGULOID) 0.52 g capsule Take 0.52 g by mouth daily.   sertraline (ZOLOFT) 50 MG tablet TAKE 1 TABLET(50 MG) BY MOUTH DAILY   No facility-administered medications prior to visit.        Objective    BP  133/82 (BP Location: Left Wrist, Patient Position: Sitting, Cuff Size: Normal)   Pulse (!) 52   Temp 98.1 F (36.7 C)   Resp 16   Ht 6' (1.829 m)   BMI 46.11 kg/m   Physical Exam  General appearance: Obese male, cooperative and in no acute distress Head: Normocephalic, without obvious abnormality, atraumatic Respiratory: Respirations even and unlabored, normal respiratory rate Extremities: All extremities are intact.  Skin: Skin color, texture, turgor normal. No rashes seen  Psych: Appropriate mood and affect. Neurologic:  Mental status: Alert, oriented to person, place, and time, thought content appropriate.   Assessment & Plan        Migraines Chronic migraines triggered by bright lights, stress, and noise. The patient has managed these triggers effectively in the past by maintaining a low-light work environment. Current workplace changes are threatening this accommodation. -Complete ADA paperwork to formalize the need for a low-light work environment. -Fax completed paperwork to the employer and provide the original to the patient for his records.  Stress The patient is experiencing significant work-related stress due to departmental changes and ethical concerns. This stress is contributing to his migraines and has led to a depressive episode. -Encourage continued counseling sessions. -To continue exploring early retirement and job change options to alleviate work-related stress.    No follow-ups on file.      [name], MD  [facility] [phone] (phone) [phone] (fax)  [facility]

## 2022-11-21 NOTE — Telephone Encounter (Signed)
Annual exam 3 months ago- notes to continue current meds Requested Prescriptions  Pending Prescriptions Disp Refills   hydrochlorothiazide (MICROZIDE) 12.5 MG capsule [Pharmacy Med Name: HYDROCHLOROTHIAZIDE 12.5MG  CAPSULES] 90 capsule 4    Sig: TAKE 1 CAPSULE(12.5 MG) BY MOUTH DAILY     Cardiovascular: Diuretics - Thiazide Failed - 11/19/2022  3:48 PM      Failed - Last BP in normal range    BP Readings from Last 1 Encounters:  11/21/22 133/82         Passed - Cr in normal range and within 180 days    Creatinine  Date Value Ref Range Status  12/09/2013 1.07 0.60 - 1.30 mg/dL Final   Creatinine, Ser  Date Value Ref Range Status  08/07/2022 1.14 0.76 - 1.27 mg/dL Final   Creatinine, POC  Date Value Ref Range Status  09/15/2015 n/a mg/dL Final         Passed - K in normal range and within 180 days    Potassium  Date Value Ref Range Status  08/07/2022 4.0 3.5 - 5.2 mmol/L Final  12/09/2013 3.6 3.5 - 5.1 mmol/L Final         Passed - Na in normal range and within 180 days    Sodium  Date Value Ref Range Status  08/07/2022 142 134 - 144 mmol/L Final  12/09/2013 141 136 - 145 mmol/L Final         Passed - Valid encounter within last 6 months    Recent Outpatient Visits           Today Other migraine without status migrainosus, not intractable   [facility] Malva Limes, MD   3 months ago Annual physical exam   [facility] Malva Limes, MD   1 year ago Annual physical exam   [facility] Malva Limes, MD   1 year ago Type 2 diabetes mellitus without complication, without long-term current use of insulin (HCC)   [facility] Malva Limes, MD   2 years ago Pyelonephritis   [facility] Malva Limes, MD

## 2022-11-24 ENCOUNTER — Telehealth: Payer: Self-pay | Admitting: Family Medicine

## 2022-11-30 ENCOUNTER — Other Ambulatory Visit: Payer: Self-pay | Admitting: Family Medicine

## 2022-11-30 DIAGNOSIS — I10 Essential (primary) hypertension: Secondary | ICD-10-CM

## 2022-12-01 MED ORDER — HYDROCHLOROTHIAZIDE 12.5 MG PO CAPS: 12.5000 mg | ORAL_CAPSULE | Freq: Every day | ORAL | 4 refills | Status: DC

## 2023-02-16 ENCOUNTER — Other Ambulatory Visit: Payer: Self-pay | Admitting: Family Medicine

## 2023-02-16 DIAGNOSIS — E119 Type 2 diabetes mellitus without complications: Secondary | ICD-10-CM

## 2023-02-18 ENCOUNTER — Other Ambulatory Visit: Payer: Self-pay | Admitting: Family Medicine

## 2023-02-19 NOTE — Telephone Encounter (Signed)
Reordered 01/23/23  #90 4 RF   Requested Prescriptions  Refused Prescriptions Disp Refills   pioglitazone (ACTOS) 30 MG tablet [Pharmacy Med Name: PIOGLITAZONE 30MG  TABLETS] 90 tablet 4    Sig: TAKE 1 TABLET(30 MG) BY MOUTH DAILY     Endocrinology:  Diabetes - Glitazones - pioglitazone Failed - 02/19/2023 12:08 PM      Failed - HBA1C is between 0 and 7.9 and within 180 days    Hgb A1c MFr Bld  Date Value Ref Range Status  08/07/2022 5.3 4.8 - 5.6 % Final    Comment:             Prediabetes: 5.7 - 6.4          Diabetes: >6.4          Glycemic control for adults with diabetes: <7.0          Passed - Valid encounter within last 6 months    Recent Outpatient Visits           3 months ago Other migraine without status migrainosus, not intractable   [facility] Malva Limes, MD   6 months ago Annual physical exam   [facility] Malva Limes, MD   1 year ago Annual physical exam   [facility] Malva Limes, MD   2 years ago Type 2 diabetes mellitus without complication, without long-term current use of insulin (HCC)   [facility] Malva Limes, MD   2 years ago Pyelonephritis   [facility] Malva Limes, MD

## 2023-02-22 ENCOUNTER — Other Ambulatory Visit: Payer: Self-pay | Admitting: Family Medicine

## 2023-02-22 DIAGNOSIS — E119 Type 2 diabetes mellitus without complications: Secondary | ICD-10-CM

## 2023-06-20 ENCOUNTER — Other Ambulatory Visit: Payer: Self-pay | Admitting: Family Medicine

## 2023-06-20 DIAGNOSIS — F410 Panic disorder [episodic paroxysmal anxiety] without agoraphobia: Secondary | ICD-10-CM

## 2023-06-20 DIAGNOSIS — E119 Type 2 diabetes mellitus without complications: Secondary | ICD-10-CM

## 2023-06-20 NOTE — Telephone Encounter (Signed)
 Requested medication (s) are due for refill today: yes  Requested medication (s) are on the active medication list: yes  Last refill:  05/29/22  Future visit scheduled: no  Notes to clinic:  Unable to refill per protocol due to failed labs, no updated results.      Requested Prescriptions  Pending Prescriptions Disp Refills   sertraline  (ZOLOFT ) 50 MG tablet [Pharmacy Med Name: SERTRALINE  50MG  TABLETS] 90 tablet 4    Sig: TAKE 1 TABLET(50 MG) BY MOUTH DAILY     Psychiatry:  Antidepressants - SSRI - sertraline  Failed - 06/20/2023  5:45 PM      Failed - Valid encounter within last 6 months    Recent Outpatient Visits   None            Passed - AST in normal range and within 360 days    AST  Date Value Ref Range Status  08/07/2022 21 0 - 40 IU/L Final   SGOT(AST)  Date Value Ref Range Status  05/02/2011 36 15 - 37 Unit/L Final         Passed - ALT in normal range and within 360 days    ALT  Date Value Ref Range Status  08/07/2022 23 0 - 44 IU/L Final   SGPT (ALT)  Date Value Ref Range Status  05/02/2011 45 U/L Final    Comment:    12-78 NOTE: NEW REFERENCE RANGE 01/20/2011          Passed - Completed PHQ-2 or PHQ-9 in the last 360 days       irbesartan -hydrochlorothiazide  (AVALIDE) 300-12.5 MG tablet [Pharmacy Med Name: IRBESARTAN /HCTZ 300-12.5MG  TABLETS] 90 tablet 3    Sig: TAKE 1 TABLET BY MOUTH DAILY     Cardiovascular: ARB + Diuretic Combos Failed - 06/20/2023  5:45 PM      Failed - K in normal range and within 180 days    Potassium  Date Value Ref Range Status  08/07/2022 4.0 3.5 - 5.2 mmol/L Final  12/09/2013 3.6 3.5 - 5.1 mmol/L Final         Failed - Na in normal range and within 180 days    Sodium  Date Value Ref Range Status  08/07/2022 142 134 - 144 mmol/L Final  12/09/2013 141 136 - 145 mmol/L Final         Failed - Cr in normal range and within 180 days    Creatinine  Date Value Ref Range Status  12/09/2013 1.07 0.60 - 1.30 mg/dL  Final   Creatinine, Ser  Date Value Ref Range Status  08/07/2022 1.14 0.76 - 1.27 mg/dL Final   Creatinine, POC  Date Value Ref Range Status  09/15/2015 n/a mg/dL Final         Failed - eGFR is 10 or above and within 180 days    EGFR (African American)  Date Value Ref Range Status  12/09/2013 >60 >52mL/min Final  09/10/2013 >60  Final   GFR calc Af Amer  Date Value Ref Range Status  07/18/2019 104 >59 mL/min/1.73 Final    Comment:    **[facility] currently reports eGFR in compliance with the current**   recommendations of the [employer]. [facility] will   update reporting as new guidelines are published from the NKF-ASN   Task force.    EGFR (Non-African Amer.)  Date Value Ref Range Status  12/09/2013 >60 >64mL/min Final    Comment:    eGFR values <53mL/min/1.73 m2 may be an indication of chronic  kidney disease (CKD). Calculated eGFR, using the MRDR Study equation, is useful in  patients with stable renal function. The eGFR calculation will not be reliable in acutely ill patients when serum creatinine is changing rapidly. It is not useful in patients on dialysis. The eGFR calculation may not be applicable to patients at the low and high extremes of body sizes, pregnant women, and vegetarians.   09/10/2013 >60  Final    Comment:    eGFR values <26mL/min/1.73 m2 may be an indication of chronic kidney disease (CKD). Calculated eGFR is useful in patients with stable renal function. The eGFR calculation will not be reliable in acutely ill patients when serum creatinine is changing rapidly. It is not useful in  patients on dialysis. The eGFR calculation may not be applicable to patients at the low and high extremes of body sizes, pregnant women, and vegetarians.    GFR, Estimated  Date Value Ref Range Status  10/09/2020 >60 >60 mL/min Final    Comment:    (NOTE) Calculated using the CKD-EPI Creatinine Equation (2021)    eGFR  Date Value Ref Range  Status  08/07/2022 78 >59 mL/min/1.73 Final         Failed - Valid encounter within last 6 months    Recent Outpatient Visits   None            Passed - Patient is not pregnant      Passed - Last BP in normal range    BP Readings from Last 1 Encounters:  11/21/22 133/82          metFORMIN  (GLUCOPHAGE -XR) 500 MG 24 hr tablet [Pharmacy Med Name: METFORMIN  ER 500MG  24HR TABS] 180 tablet 4    Sig: TAKE 2 TABLETS BY MOUTH DAILY     Endocrinology:  Diabetes - Biguanides Failed - 06/20/2023  5:45 PM      Failed - HBA1C is between 0 and 7.9 and within 180 days    Hgb A1c MFr Bld  Date Value Ref Range Status  08/07/2022 5.3 4.8 - 5.6 % Final    Comment:             Prediabetes: 5.7 - 6.4          Diabetes: >6.4          Glycemic control for adults with diabetes: <7.0          Failed - B12 Level in normal range and within 720 days    No results found for: "VITAMINB12"       Failed - Valid encounter within last 6 months    Recent Outpatient Visits   None            Passed - Cr in normal range and within 360 days    Creatinine  Date Value Ref Range Status  12/09/2013 1.07 0.60 - 1.30 mg/dL Final   Creatinine, Ser  Date Value Ref Range Status  08/07/2022 1.14 0.76 - 1.27 mg/dL Final   Creatinine, POC  Date Value Ref Range Status  09/15/2015 n/a mg/dL Final         Passed - eGFR in normal range and within 360 days    EGFR (African American)  Date Value Ref Range Status  12/09/2013 >60 >109mL/min Final  09/10/2013 >60  Final   GFR calc Af Amer  Date Value Ref Range Status  07/18/2019 104 >59 mL/min/1.73 Final    Comment:    **[facility] currently reports eGFR in compliance with the current**  recommendations of the [employer]. [facility] will   update reporting as new guidelines are published from the NKF-ASN   Task force.    EGFR (Non-African Amer.)  Date Value Ref Range Status  12/09/2013 >60 >18mL/min Final    Comment:    eGFR values  <69mL/min/1.73 m2 may be an indication of chronic kidney disease (CKD). Calculated eGFR, using the MRDR Study equation, is useful in  patients with stable renal function. The eGFR calculation will not be reliable in acutely ill patients when serum creatinine is changing rapidly. It is not useful in patients on dialysis. The eGFR calculation may not be applicable to patients at the low and high extremes of body sizes, pregnant women, and vegetarians.   09/10/2013 >60  Final    Comment:    eGFR values <83mL/min/1.73 m2 may be an indication of chronic kidney disease (CKD). Calculated eGFR is useful in patients with stable renal function. The eGFR calculation will not be reliable in acutely ill patients when serum creatinine is changing rapidly. It is not useful in  patients on dialysis. The eGFR calculation may not be applicable to patients at the low and high extremes of body sizes, pregnant women, and vegetarians.    GFR, Estimated  Date Value Ref Range Status  10/09/2020 >60 >60 mL/min Final    Comment:    (NOTE) Calculated using the CKD-EPI Creatinine Equation (2021)    eGFR  Date Value Ref Range Status  08/07/2022 78 >59 mL/min/1.73 Final         Passed - CBC within normal limits and completed in the last 12 months    WBC  Date Value Ref Range Status  08/07/2022 6.4 3.4 - 10.8 x10E3/uL Final  10/09/2020 7.8 4.0 - 10.5 K/uL Final   RBC  Date Value Ref Range Status  08/07/2022 4.62 4.14 - 5.80 x10E6/uL Final  10/09/2020 4.53 4.22 - 5.81 MIL/uL Final   Hemoglobin  Date Value Ref Range Status  08/07/2022 14.3 13.0 - 17.7 g/dL Final   Hematocrit  Date Value Ref Range Status  08/07/2022 42.8 37.5 - 51.0 % Final   MCHC  Date Value Ref Range Status  08/07/2022 33.4 31.5 - 35.7 g/dL Final  08/65/7846 96.2 30.0 - 36.0 g/dL Final   [facility]  Date Value Ref Range Status  08/07/2022 31.0 26.6 - 33.0 pg Final  10/09/2020 31.3 26.0 - 34.0 pg Final   MCV  Date Value Ref  Range Status  08/07/2022 93 79 - 97 fL Final  12/09/2013 90 80 - 100 fL Final   No results found for: "PLTCOUNTKUC", "LABPLAT", "POCPLA" RDW  Date Value Ref Range Status  08/07/2022 12.7 11.6 - 15.4 % Final  12/09/2013 13.0 11.5 - 14.5 % Final

## 2023-07-20 ENCOUNTER — Other Ambulatory Visit: Payer: Self-pay | Admitting: Family Medicine

## 2023-07-20 DIAGNOSIS — E119 Type 2 diabetes mellitus without complications: Secondary | ICD-10-CM

## 2023-07-20 DIAGNOSIS — F410 Panic disorder [episodic paroxysmal anxiety] without agoraphobia: Secondary | ICD-10-CM

## 2023-07-24 LAB — HM DIABETES EYE EXAM

## 2023-08-11 ENCOUNTER — Ambulatory Visit
Admission: EM | Admit: 2023-08-11 | Discharge: 2023-08-11 | Disposition: A | Discharge status: Home / Self Care | Attending: Emergency Medicine | Admitting: Emergency Medicine

## 2023-08-11 DIAGNOSIS — R109 Unspecified abdominal pain: Secondary | ICD-10-CM | POA: Diagnosis present

## 2023-08-11 LAB — URINALYSIS, W/ REFLEX TO CULTURE (INFECTION SUSPECTED)
Bilirubin Urine: NEGATIVE
Glucose, UA: NEGATIVE mg/dL
Hgb urine dipstick: NEGATIVE
Ketones, ur: NEGATIVE mg/dL
Leukocytes,Ua: NEGATIVE
Nitrite: NEGATIVE
Protein, ur: NEGATIVE mg/dL
Specific Gravity, Urine: 1.025 (ref 1.005–1.030)
pH: 5.5 (ref 5.0–8.0)

## 2023-08-11 MED ORDER — BACLOFEN 10 MG PO TABS: 10.0000 mg | ORAL_TABLET | Freq: Three times a day (TID) | ORAL | 0 refills | Status: DC

## 2023-08-11 NOTE — ED Triage Notes (Signed)
 Pt c/o L sided flank pain x4 days. States felt a twinge when turning around to wash hands. Concerned for pulled muscle. Has tried IBU w/o relief.

## 2023-08-11 NOTE — Discharge Instructions (Addendum)
 Your urinalysis did not show any evidence that you have a kidney stone.  I do believe that you are experiencing musculoskeletal pain in your left flank.  Continue your 800 mg ibuprofen  as previously prescribed.  Add on the 10 mg of baclofen  every 8 hours.  This is a nonsedating muscle relaxer.  This will help decrease muscle pain and tension.  You may apply topical Salonpas or lidocaine patches directly to the area of pain.  Each patch is good for 8 hours.  You may also apply ice, or moist heat, to the area of pain for 20 minutes at a time, 2-3 times a day, to help with pain and inflammation.  If you are using ice make sure you have a cloth between the ice and your skin so as to not cause skin damage.  If your symptoms or not improving, or new symptoms develop, please return for reevaluation or follow-up with your primary care provider.

## 2023-08-11 NOTE — ED Provider Notes (Signed)
 [facility]    CSN: [id number] Arrival date & time: 08/11/23  5784      History   Chief Complaint Chief Complaint  Patient presents with   Flank Pain    HPI [name] is a 53 y.o. male.   HPI  53 year old male with past medical history significant for anxiety disorder, type 2 diabetes, primary hypertension, obesity, migraine headaches, and gout presents for evaluation of a left flank pain that started 4 days ago.  He reports that he was standing at the sink washing his hands and when he turned to reach for a tile he felt a twinge.  Since then he has had this pain that does increase with movement and deep breathing.  It is a constant pain.  He denies any radiation, fever, urinary symptoms, diarrhea, or constipation.  He has been taking 800 mg of ibuprofen  which has helped with the pain.  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 Larrie Po, [facility]   COLONOSCOPY N/A 03/28/2021   Procedure: COLONOSCOPY;  Surgeon: Marnee Sink, MD;  Location: [facility];  Service: Endoscopy;   Laterality: N/A;   FRACTURE SURGERY  2006   KNEE SURGERY  2006   left knee reconstruction after tibial fracture   LEG SURGERY  1975   leg and foot surgery; right lower leg circa   POLYPECTOMY  03/28/2021   Procedure: POLYPECTOMY;  Surgeon: Marnee Sink, MD;  Location: [facility];  Service: Endoscopy;;   SPINE SURGERY  Feb 2019   Anterior cervical decompression of C4/C6 (I think)       Home Medications    Prior to Admission medications   Medication Sig Start Date End Date Taking? Authorizing Provider  acetaminophen  (TYLENOL ) 500 MG tablet Take 500 mg by mouth every 6 (six) hours as needed.   Yes [provider]  acetaminophen  (TYLENOL ) 650 MG CR tablet Tylenol  Arthritis 650 mg tablet,extended release   2 tablets every day by oral route. 05/28/09  Yes [provider]  baclofen  (LIORESAL ) 10 MG tablet Take 1 tablet (10 mg total) by mouth 3 (three) times daily. 08/11/23  Yes Kent Pear, NP  colchicine  0.6 MG tablet First day: Take 2 tablets.  Wait 1 hour and take 1 more tablet. Days 2-5. Take one tablet daily. 12/15/20  Yes Woods, Jaclyn M, PA-C  fluticasone  (FLONASE ) 50 MCG/ACT nasal spray Place 2 sprays into both nostrils daily. 07/02/22  Yes Collins Dean, NP  glucose blood (ONETOUCH ULTRA) test strip Use as instructed.  For Diabetes E11.9 08/02/21  Yes Lamon Pillow, MD  hydrochlorothiazide  (MICROZIDE ) 12.5 MG capsule Take 1 capsule (12.5 mg total) by mouth daily. 12/01/22  Yes Lamon Pillow, MD  ibuprofen  (ADVIL ) 800 MG tablet TAKE 1 TABLET BY MOUTH TWICE DAILY AS NEEDED 02/19/23  Yes Lamon Pillow, MD  irbesartan -hydrochlorothiazide  (AVALIDE) 300-12.5 MG tablet TAKE 1 TABLET BY MOUTH DAILY 07/20/23  Yes Lamon Pillow, MD  loratadine (CLARITIN) 10 MG tablet Take 10 mg by mouth daily.   Yes [provider]  meclizine  (ANTIVERT ) 25 MG tablet Take 1 tablet (25 mg total) by mouth 3 (three) times daily as needed for dizziness. 09/15/20  Yes  Aileen Alexanders, NP  metFORMIN  (GLUCOPHAGE -XR) 500 MG 24 hr tablet TAKE 2 TABLETS BY MOUTH DAILY 07/20/23  Yes Lamon Pillow, MD  pioglitazone  (ACTOS ) 30 MG tablet TAKE 1 TABLET(30 MG) BY MOUTH DAILY 02/26/23  Yes Lamon Pillow, MD  Probiotic Product (PROBIOTIC DAILY PO) Take by mouth.   Yes [provider]  psyllium (REGULOID) 0.52 g capsule Take 0.52 g by mouth daily.   Yes [provider]  sertraline  (ZOLOFT ) 50 MG tablet TAKE 1 TABLET(50 MG) BY MOUTH DAILY 07/20/23  Yes Lamon Pillow, MD  traZODone  (DESYREL ) 50 MG tablet TAKE 2 TO 3 TABLETS BY MOUTH AT BEDTIME Patient not taking: No sig reported 01/07/18 03/10/19  Lamon Pillow, MD    Family History Family History  Problem Relation Age of Onset   Panic disorder Mother    Diabetes Mother    Heart disease Mother    Hypertension Mother    Obesity Mother    Alcohol abuse Father    Pancreatic cancer Father    Stomach cancer Father    Lung cancer Father    Cancer Father    Obesity Father    Drug abuse Sister    Healthy Brother    Prostate cancer Other    Lung cancer Other    Anxiety disorder Other    Diabetes Maternal Grandfather    Obesity Maternal Grandfather    Stroke Maternal Grandmother    Alcohol abuse Paternal Grandfather    Obesity Paternal Grandfather    Diabetes Paternal Grandmother    Obesity Paternal Grandmother    Colon cancer Neg Hx     Social History Social History   Tobacco Use   Smoking status: Never   Smokeless tobacco: Never  Vaping Use   Vaping status: Never Used  Substance Use Topics   Alcohol use: No   Drug use: No     Allergies   Doxycycline , Amlodipine besylate, Augmentin [amoxicillin -pot clavulanate], Indomethacin , Keflex  [cephalexin], and Cyclobenzaprine   Review of Systems Review of Systems  Constitutional:  Negative for fever.  Genitourinary:  Positive for flank pain. Negative for dysuria, frequency, hematuria and urgency.     Physical Exam Triage  Vital Signs ED Triage Vitals  Encounter Vitals Group     BP --      Girls Systolic BP Percentile --      Girls Diastolic BP Percentile --      Boys Systolic BP Percentile --      Boys Diastolic BP Percentile --      Pulse --      Resp 08/11/23 0834 16     Temp --      Temp Source 08/11/23 0834 Oral     SpO2 --      Weight 08/11/23 0833 (!) 353 lb (160.1 kg)  Height 08/11/23 0833 6' (1.829 m)     Head Circumference --      Peak Flow --      Pain Score 08/11/23 0839 5     Pain Loc --      Pain Education --      Exclude from Growth Chart --    No data found.  Updated Vital Signs BP 132/73 (BP Location: Right Arm)   Pulse (!) 50   Temp 98.6 F (37 C) (Oral)   Resp 16   Ht 6' (1.829 m)   Wt (!) 353 lb (160.1 kg)   SpO2 96%   BMI 47.88 kg/m   Visual Acuity Right Eye Distance:   Left Eye Distance:   Bilateral Distance:    Right Eye Near:   Left Eye Near:    Bilateral Near:     Physical Exam Vitals and nursing note reviewed.  Constitutional:      Appearance: Normal appearance. He is not ill-appearing.  HENT:     Head: Normocephalic and atraumatic.   Cardiovascular:     Rate and Rhythm: Normal rate and regular rhythm.     Pulses: Normal pulses.     Heart sounds: Normal heart sounds. No murmur heard.    No friction rub. No gallop.  Pulmonary:     Effort: Pulmonary effort is normal.     Breath sounds: Normal breath sounds. No wheezing, rhonchi or rales.  Abdominal:     Tenderness: There is no right CVA tenderness or left CVA tenderness.   Musculoskeletal:        General: Tenderness present.   Skin:    General: Skin is warm and dry.     Capillary Refill: Capillary refill takes less than 2 seconds.   Neurological:     General: No focal deficit present.     Mental Status: He is alert and oriented to person, place, and time.      UC Treatments / Results  Labs (all labs ordered are listed, but only abnormal results are displayed) Labs Reviewed   URINALYSIS, W/ REFLEX TO CULTURE (INFECTION SUSPECTED) - Abnormal; Notable for the following components:      Result Value   Bacteria, UA RARE (*)    All other components within normal limits    EKG   Radiology No results found.  Procedures Procedures (including critical care time)  Medications Ordered in UC Medications - No data to display  Initial Impression / Assessment and Plan / UC Course  I have reviewed the triage vital signs and the nursing notes.  Pertinent labs & imaging results that were available during my care of the patient were reviewed by me and considered in my medical decision making (see chart for details).   Patient is a nontoxic-appearing 53 year old male presenting for evaluation of 4 days with a left flank pain without associated urinary symptoms as outlined in HPI above.  In the exam room the patient does not seem to be in any acute distress and is resting comfortably on the exam table.  Physical exam reveals clear lung sounds in all fields and no CVA tenderness.  The patient does have tenderness with palpation of the lateral left flank at the base of the 10th rib that extends anteriorly.  The patient reports that he has had pain in that area before and was diagnosed with an abdominal oblique injury.  Given that the pain increases with deep breathing and movement I suspect that this is musculoskeletal in nature.  However, the patient has had pyelonephritis in the past.  I will order urinalysis to rule out the presence of infection and to evaluate for any blood in the urine which may indicate the possibility of a renal stone.  Urinalysis is negative for leukocyte esterase, nitrites, protein, or hemoglobin.  Reflex microscopy does shows 6-10 red cells with rare bacteria and mucus present.  No white cells and no calcium oxalate crystals.  On discharge patient home with diagnosis of musculoskeletal pain and have him continue his 800 mg of ibuprofen  and I will add on  10 mg of baclofen  that he can take every 8 hours.   Final Clinical Impressions(s) / UC Diagnoses   Final diagnoses:  Left flank pain     Discharge Instructions      Your urinalysis did not show any evidence that you have a kidney stone.  I do believe that you are experiencing musculoskeletal pain in your left flank.  Continue your 800 mg ibuprofen  as previously prescribed.  Add on the 10 mg of baclofen  every 8 hours.  This is a nonsedating muscle relaxer.  This will help decrease muscle pain and tension.  You may apply topical Salonpas or lidocaine patches directly to the area of pain.  Each patch is good for 8 hours.  You may also apply ice, or moist heat, to the area of pain for 20 minutes at a time, 2-3 times a day, to help with pain and inflammation.  If you are using ice make sure you have a cloth between the ice and your skin so as to not cause skin damage.  If your symptoms or not improving, or new symptoms develop, please return for reevaluation or follow-up with your primary care provider.     ED Prescriptions     Medication Sig Dispense Auth. Provider   baclofen  (LIORESAL ) 10 MG tablet Take 1 tablet (10 mg total) by mouth 3 (three) times daily. 30 each Kent Pear, NP      PDMP not reviewed this encounter.   Kent Pear, NP 08/11/23 [phone]

## 2023-09-14 ENCOUNTER — Telehealth: Payer: Self-pay | Admitting: Emergency Medicine

## 2023-09-14 ENCOUNTER — Ambulatory Visit
Admission: EM | Admit: 2023-09-14 | Discharge: 2023-09-14 | Disposition: A | Discharge status: Home / Self Care | Attending: Emergency Medicine | Admitting: Emergency Medicine

## 2023-09-14 ENCOUNTER — Encounter: Payer: Self-pay | Admitting: Emergency Medicine

## 2023-09-14 DIAGNOSIS — H04002 Unspecified dacryoadenitis, left lacrimal gland: Secondary | ICD-10-CM | POA: Diagnosis not present

## 2023-09-14 MED ORDER — AMOXICILLIN-POT CLAVULANATE 875-125 MG PO TABS: 1.0000 | ORAL_TABLET | Freq: Two times a day (BID) | ORAL | 0 refills | Status: AC

## 2023-09-14 MED ORDER — SULFAMETHOXAZOLE-TRIMETHOPRIM 800-160 MG PO TABS: 1.0000 | ORAL_TABLET | Freq: Two times a day (BID) | ORAL | 0 refills | Status: AC

## 2023-09-14 NOTE — ED Triage Notes (Signed)
 Patient reports redness, drainage, and tenderness in his left eye for 2 days.  Patient denies any injury to left eye.

## 2023-09-14 NOTE — ED Provider Notes (Signed)
 [facility]    CSN: [id number] Arrival date & time: 09/14/23  0804      History   Chief Complaint Chief Complaint  Patient presents with   Eye Problem    left    HPI [name] is a 53 y.o. male.   HPI  53 year old male with past medical history significant for hypertension, heart murmur, diabetes, depression, clubfoot, cervical radiculitis, and anxiety presents for evaluation of redness, drainage, tenderness from his left thigh that started 2 days ago.  He reports that 3 to 4 days ago he was washing dishes and some of the water  splashed into the eye.  Following that exposure the patient developed the symptoms that he is currently experiencing he describes having some crustiness on the inner canthus of his left eye as well as some crusting of his eyelashes in the mornings when he wakes up.  He denies any change in vision, itching, or fever.  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 Mavis, [facility]   COLONOSCOPY N/A 03/28/2021   Procedure: COLONOSCOPY;  Surgeon: Jinny Carmine, MD;   Location: [facility];  Service: Endoscopy;  Laterality: N/A;   FRACTURE SURGERY  2006   KNEE SURGERY  2006   left knee reconstruction after tibial fracture   LEG SURGERY  1975   leg and foot surgery; right lower leg circa   POLYPECTOMY  03/28/2021   Procedure: POLYPECTOMY;  Surgeon: Jinny Carmine, MD;  Location: [facility];  Service: Endoscopy;;   SPINE SURGERY  Feb 2019   Anterior cervical decompression of C4/C6 (I think)       Home Medications    Prior to Admission medications   Medication Sig Start Date End Date Taking? Authorizing Provider  sulfamethoxazole -trimethoprim  (BACTRIM  DS) 800-160 MG tablet Take 1 tablet by mouth 2 (two) times daily for 7 days. 09/14/23 09/21/23 Yes Bernardino Ditch, NP  acetaminophen  (TYLENOL ) 500 MG tablet Take 500 mg by mouth every 6 (six) hours as needed.    [provider]  acetaminophen  (TYLENOL ) 650 MG CR tablet Tylenol  Arthritis 650 mg tablet,extended release   2 tablets every day by oral route. 05/28/09   [provider]  fluticasone  (FLONASE ) 50 MCG/ACT nasal spray Place 2 sprays into both nostrils daily. 07/02/22   Fleming, Zelda W, NP  glucose blood (ONETOUCH ULTRA) test strip Use as instructed.  For Diabetes E11.9 08/02/21   Gasper Nancyann BRAVO, MD  hydrochlorothiazide  (MICROZIDE ) 12.5 MG capsule Take 1 capsule (12.5 mg total) by mouth daily. 12/01/22  Gasper Nancyann BRAVO, MD  ibuprofen  (ADVIL ) 800 MG tablet TAKE 1 TABLET BY MOUTH TWICE DAILY AS NEEDED 02/19/23   Gasper Nancyann BRAVO, MD  irbesartan -hydrochlorothiazide  (AVALIDE) 300-12.5 MG tablet TAKE 1 TABLET BY MOUTH DAILY 07/20/23   Gasper Nancyann BRAVO, MD  loratadine (CLARITIN) 10 MG tablet Take 10 mg by mouth daily.    [provider]  meclizine  (ANTIVERT ) 25 MG tablet Take 1 tablet (25 mg total) by mouth 3 (three) times daily as needed for dizziness. 09/15/20   Melvin Pao, NP  metFORMIN  (GLUCOPHAGE -XR) 500 MG 24 hr tablet TAKE 2 TABLETS BY MOUTH DAILY 07/20/23    Gasper Nancyann BRAVO, MD  pioglitazone  (ACTOS ) 30 MG tablet TAKE 1 TABLET(30 MG) BY MOUTH DAILY 02/26/23   Gasper Nancyann BRAVO, MD  Probiotic Product (PROBIOTIC DAILY PO) Take by mouth.    [provider]  psyllium (REGULOID) 0.52 g capsule Take 0.52 g by mouth daily.    [provider]  sertraline  (ZOLOFT ) 50 MG tablet TAKE 1 TABLET(50 MG) BY MOUTH DAILY 07/20/23   Gasper Nancyann BRAVO, MD  traZODone  (DESYREL ) 50 MG tablet TAKE 2 TO 3 TABLETS BY MOUTH AT BEDTIME Patient not taking: No sig reported 01/07/18 03/10/19  Gasper Nancyann BRAVO, MD    Family History Family History  Problem Relation Age of Onset   Panic disorder Mother    Diabetes Mother    Heart disease Mother    Hypertension Mother    Obesity Mother    Alcohol abuse Father    Pancreatic cancer Father    Stomach cancer Father    Lung cancer Father    Cancer Father    Obesity Father    Drug abuse Sister    Healthy Brother    Prostate cancer Other    Lung cancer Other    Anxiety disorder Other    Diabetes Maternal Grandfather    Obesity Maternal Grandfather    Stroke Maternal Grandmother    Alcohol abuse Paternal Grandfather    Obesity Paternal Grandfather    Diabetes Paternal Grandmother    Obesity Paternal Grandmother    Colon cancer Neg Hx     Social History Social History   Tobacco Use   Smoking status: Never   Smokeless tobacco: Never  Vaping Use   Vaping status: Never Used  Substance Use Topics   Alcohol use: No   Drug use: No     Allergies   Doxycycline , Amlodipine besylate, Augmentin [amoxicillin -pot clavulanate], Indomethacin , Keflex  [cephalexin], and Cyclobenzaprine   Review of Systems Review of Systems  Constitutional:  Negative for fever.  Eyes:  Positive for pain, discharge and redness. Negative for photophobia, itching and visual disturbance.     Physical Exam Triage Vital Signs ED Triage Vitals  Encounter Vitals Group     BP      Girls Systolic BP Percentile      Girls  Diastolic BP Percentile      Boys Systolic BP Percentile      Boys Diastolic BP Percentile      Pulse      Resp      Temp      Temp src      SpO2      Weight      Height      Head Circumference      Peak Flow      Pain Score      Pain Loc      Pain Education  Exclude from Growth Chart    No data found.  Updated Vital Signs BP (!) 154/82 (BP Location: Right Arm)   Pulse (!) 51   Temp 98.5 F (36.9 C) (Oral)   Resp 16   Ht 6' (1.829 m)   Wt (!) 352 lb 15.3 oz (160.1 kg)   SpO2 98%   BMI 47.87 kg/m   Visual Acuity Right Eye Distance: 20/40 corrected Left Eye Distance: 20/25 corrected Bilateral Distance: 20/20 corrected  Right Eye Near:   Left Eye Near:    Bilateral Near:     Physical Exam Vitals and nursing note reviewed.  Constitutional:      Appearance: Normal appearance. He is not ill-appearing.  HENT:     Head: Normocephalic and atraumatic.  Eyes:     General: No scleral icterus.       Right eye: No discharge.        Left eye: No discharge.     Extraocular Movements: Extraocular movements intact.     Conjunctiva/sclera: Conjunctivae normal.     Pupils: Pupils are equal, round, and reactive to light.  Neurological:     Mental Status: He is alert.      UC Treatments / Results  Labs (all labs ordered are listed, but only abnormal results are displayed) Labs Reviewed - No data to display  EKG   Radiology No results found.  Procedures Procedures (including critical care time)  Medications Ordered in UC Medications - No data to display  Initial Impression / Assessment and Plan / UC Course  I have reviewed the triage vital signs and the nursing notes.  Pertinent labs & imaging results that were available during my care of the patient were reviewed by me and considered in my medical decision making (see chart for details).   Patient is a pleasant, nontoxic-appearing 53 year old male presenting for evaluation of left eye irritation as  outlined in HPI above.  As you can see in image above, there is erythema and edema to the medial aspect of the lower eyelid going into the inner canthus surrounding the tear duct.  It also tracks inferiorly and laterally.  The eye itself is free of complaint.  Pupils equal round reactive and EOM's intact.  Sclera is white and quiet and bulbar and labral conjunctiva are unremarkable.  There is a normal red light reflex in the left eye.  Given the edema and erythema surrounding the tear duct I suspect that the patient has developed dacryoadenitis as a result of the dirty water  exposure.  Cephalosporins and penicillins tend to cause the patient to have insomnia so I will discharge him home on a 7-day course of Bactrim .  He may also apply warm compresses to the eye to help facilitate drainage and gently massage the inner canthus to help express any infection collecting within the tear duct.  If his symptoms do not improve he will need to follow-up with ophthalmology.   Final Clinical Impressions(s) / UC Diagnoses   Final diagnoses:  Dacryoadenitis of left lacrimal gland     Discharge Instructions      Take the Bactrim  twice daily with a full glass of water  for 7 days for treatment of your tear duct infection.  Gently massage your tear duct 2 or 3 times a day to help express any infection that is collecting.  You may apply warm compresses to your eye for 20 minutes at a time, 2-3 times a day, to help facilitate drainage of the infection.  If  you develop any increased redness, swelling, pus drainage from your tear duct, changes to vision, or fever I recommend you follow-up with an ophthalmologist.     ED Prescriptions     Medication Sig Dispense Auth. Provider   sulfamethoxazole -trimethoprim  (BACTRIM  DS) 800-160 MG tablet Take 1 tablet by mouth 2 (two) times daily for 7 days. 14 tablet Bernardino Ditch, NP      PDMP not reviewed this encounter.   Bernardino Ditch, NP 09/14/23 [phone]

## 2023-09-14 NOTE — Discharge Instructions (Addendum)
 Take the Bactrim  twice daily with a full glass of water  for 7 days for treatment of your tear duct infection.  Gently massage your tear duct 2 or 3 times a day to help express any infection that is collecting.  You may apply warm compresses to your eye for 20 minutes at a time, 2-3 times a day, to help facilitate drainage of the infection.  If you develop any increased redness, swelling, pus drainage from your tear duct, changes to vision, or fever I recommend you follow-up with an ophthalmologist.

## 2023-09-14 NOTE — Telephone Encounter (Signed)
 Patient called back to state that on his MyChart he has Bactrim  listed as an allergy causing hives.  I can find no documentation in epic that he has a hive allergy to sulfa .  He also indicates that he is allergic to methylprednisolone.  There was a single entry from 2022 that is no longer present on his most recent office visit with his PCP earlier this year.  We agreed to do a trial of Augmentin given that his allergies insomnia.

## 2023-09-16 ENCOUNTER — Other Ambulatory Visit: Payer: Self-pay | Admitting: Family Medicine

## 2023-10-15 ENCOUNTER — Other Ambulatory Visit: Payer: Self-pay | Admitting: Family Medicine

## 2023-10-22 ENCOUNTER — Ambulatory Visit (INDEPENDENT_AMBULATORY_CARE_PROVIDER_SITE_OTHER): Admitting: Family Medicine

## 2023-10-22 VITALS — BP 120/80 | HR 61 | Ht 72.0 in | Wt 367.0 lb

## 2023-10-22 DIAGNOSIS — F413 Other mixed anxiety disorders: Secondary | ICD-10-CM

## 2023-10-22 DIAGNOSIS — I10 Essential (primary) hypertension: Secondary | ICD-10-CM | POA: Diagnosis not present

## 2023-10-22 DIAGNOSIS — E119 Type 2 diabetes mellitus without complications: Secondary | ICD-10-CM | POA: Diagnosis not present

## 2023-10-22 DIAGNOSIS — Z0001 Encounter for general adult medical examination with abnormal findings: Secondary | ICD-10-CM

## 2023-10-22 DIAGNOSIS — Z125 Encounter for screening for malignant neoplasm of prostate: Secondary | ICD-10-CM

## 2023-10-22 DIAGNOSIS — Z0000 Encounter for general adult medical examination without abnormal findings: Secondary | ICD-10-CM

## 2023-10-22 NOTE — Patient Instructions (Signed)
 Please review the attached list of medications and notify my office if there are any errors.   I recommend that you get the Prevnar 20 vaccine to protect yourself from certain dangerous strains of pneumonia. You can get Prevnar 20 at your pharmacy, or call our office at [phone] at your earliest convenience to schedule this vaccine.

## 2023-10-22 NOTE — Progress Notes (Signed)
 Complete physical exam   Patient: [name]   DOB: Sep 12, 1970   53 y.o. Male  MRN: [id number] Visit Date: 10/22/2023  Today's healthcare provider: Nancyann Perry, MD   Chief Complaint  Patient presents with   Annual Exam    Patient states he is feeling fairly well but is concerned over the weight he has gained in the last year.    Subjective    Discussed the use of AI scribe software for clinical note transcription with the patient, who gave verbal consent to proceed.  History of Present Illness   [name] [name] is a 53 year old male who presents for yearly physical and follow-up regarding weight management and diabetes control.  He has experienced recent weight gain, attributed to stress eating from August to December of the previous year. Efforts to eat less and increase physical activity have been hampered by hip bursitis, for which he takes ibuprofen . He anticipates more physical activity with the start of in-person classes in October.  He is currently taking Actos   and metformin  for diabetes management and believes it has kept his blood sugar stable, though he suspects a slight increase due to weight gain.  He is also taking sertraline  for anxiety, which he is attempting to taper down from 50 mg by alternating with 25 mg every other day, with plans to reduce further in two weeks. 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   He reports worsening tinnitus and has noticed some ear wax buildup. He has seen his eye doctor within the last year, who noted a slight change in his prescription but no diabetic eye problems.     Lab Results  Component Value Date   HGBA1C 5.3 08/07/2022   HGBA1C 8.4 (H) 07/26/2021   HGBA1C 6.3 (A) 01/19/2021   Wt Readings from Last 3 Encounters:  10/22/23 (!) 367 lb (166.5 kg)  09/14/23 (!) 352 lb 15.3 oz  (160.1 kg)  08/11/23 (!) 353 lb (160.1 kg)     HPI     Annual Exam    Additional comments: Patient states he is feeling fairly well but is concerned over the weight he has gained in the last year.       Last edited by Kathi Buel BIRCH, CMA on 10/22/2023  3:04 PM.       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 Mavis, [facility]   COLONOSCOPY N/A 03/28/2021   Procedure: COLONOSCOPY;  Surgeon: Jinny Carmine, MD;  Location: [facility];  Service: Endoscopy;  Laterality: N/A;   FRACTURE SURGERY  2006   KNEE SURGERY  2006   left knee reconstruction after tibial fracture   LEG SURGERY  1975   leg and foot surgery; right lower leg circa   POLYPECTOMY  03/28/2021   Procedure: POLYPECTOMY;  Surgeon: Jinny Carmine, MD;  Location: [facility];  Service: Endoscopy;;   SPINE SURGERY  Feb 2019   Anterior cervical  decompression of C4/C6 (I think)   Social History   Socioeconomic History   Marital status: Married    Spouse name: Not on file   Number of children: 0   Years of education: Not on file   Highest education level: Doctorate  Occupational History   Occupation: [employer]    Comment: Employed  Tobacco Use   Smoking status: Never   Smokeless tobacco: Never  Vaping Use   Vaping status: Never Used  Substance and Sexual Activity   Alcohol use: No   Drug use: No   Sexual activity: Yes    Birth control/protection: None  Other Topics Concern   Not on file  Social History Narrative   Not on file   Social Drivers of Health   Financial Resource Strain: Low Risk  (11/20/2022)   Overall Financial Resource  Strain (CARDIA)    Difficulty of Paying Living Expenses: Not very hard  Food Insecurity: No Food Insecurity (11/20/2022)   Hunger Vital Sign    Worried About Running Out of Food in the Last Year: Never true    Ran Out of Food in the Last Year: Never true  Transportation Needs: No Transportation Needs (11/20/2022)   PRAPARE - Administrator, Civil Service (Medical): No    Lack of Transportation (Non-Medical): No  Physical Activity: Insufficiently Active (11/20/2022)   Exercise Vital Sign    Days of Exercise per Week: 2 days    Minutes of Exercise per Session: 20 min  Stress: Stress Concern Present (11/20/2022)   [employer] of Occupational Health - Occupational Stress Questionnaire    Feeling of Stress : Very much  Social Connections: Moderately Integrated (11/20/2022)   Social Connection and Isolation Panel    Frequency of Communication with Friends and Family: Twice a week    Frequency of Social Gatherings with Friends and Family: Once a week    Attends Religious Services: More than 4 times per year    Active Member of [employer] or Organizations: No    Attends Engineer, structural: Not on file    Marital Status: Married  Catering manager Violence: Not on file   Family Status  Relation Name Status   Mother Reena Snowden Alive   Father Ubaldo Migliaccio Deceased at age 60       Cause of Death: Stomach and pancreatic cancer   Sister HALF sister - Art gallery manager Alive   Brother HALF Alive   Other Gen. family hx (Not Specified)   MGF Eveline Ling (Not Specified)   MGM Levorn Ling (Not Specified)   PGF Cletus Brouillard (Not Specified)   PGM Maceo Severin (Not Specified)   Neg Hx  (Not Specified)  No partnership data on file   Family History  Problem Relation Age of Onset   Panic disorder Mother    Diabetes Mother    Heart disease Mother    Hypertension Mother    Obesity Mother    Alcohol abuse Father    Pancreatic cancer Father    Stomach cancer Father    Lung cancer Father     Cancer Father    Obesity Father    Drug abuse Sister    Healthy Brother    Prostate cancer Other    Lung cancer Other    Anxiety disorder Other    Diabetes Maternal Grandfather    Obesity Maternal Grandfather    Stroke Maternal Grandmother    Alcohol abuse Paternal Grandfather    Obesity Paternal Actor  Diabetes Paternal Grandmother    Obesity Paternal Grandmother    Colon cancer Neg Hx    Allergies  Allergen Reactions   Doxycycline  Rash   Amlodipine Besylate     Constipation, palitations, swelling, shortness of breath   Augmentin  [Amoxicillin -Pot Clavulanate]     intolerant: Causes insomnia   Indomethacin  Swelling    Caused Fluid retention   Keflex  [Cephalexin]     intolerant   Cyclobenzaprine Palpitations    Patient Care Team: Gasper Nancyann BRAVO, MD as PCP - General (Family Medicine) Lilli, Donnice, DPM (Inactive) as Attending Physician (Podiatry) Mevelyn JONETTA Bathe, OD (Optometry)   Medications: Outpatient Medications Prior to Visit  Medication Sig   acetaminophen  (TYLENOL ) 650 MG CR tablet Tylenol  Arthritis 650 mg tablet,extended release   2 tablets every day by oral route.   glucose blood (ONETOUCH ULTRA) test strip Use as instructed.  For Diabetes E11.9   hydrochlorothiazide  (MICROZIDE ) 12.5 MG capsule Take 1 capsule (12.5 mg total) by mouth daily.   ibuprofen  (ADVIL ) 800 MG tablet TAKE 1 TABLET BY MOUTH TWICE DAILY AS NEEDED   irbesartan -hydrochlorothiazide  (AVALIDE) 300-12.5 MG tablet TAKE 1 TABLET BY MOUTH DAILY   loratadine (CLARITIN) 10 MG tablet Take 10 mg by mouth daily.   metFORMIN  (GLUCOPHAGE -XR) 500 MG 24 hr tablet TAKE 2 TABLETS BY MOUTH DAILY   pioglitazone  (ACTOS ) 30 MG tablet TAKE 1 TABLET(30 MG) BY MOUTH DAILY   Probiotic Product (PROBIOTIC DAILY PO) Take by mouth.   psyllium (REGULOID) 0.52 g capsule Take 0.52 g by mouth daily.   sertraline  (ZOLOFT ) 50 MG tablet TAKE 1 TABLET(50 MG) BY MOUTH DAILY   [DISCONTINUED] acetaminophen   (TYLENOL ) 500 MG tablet Take 500 mg by mouth every 6 (six) hours as needed.   [DISCONTINUED] fluticasone  (FLONASE ) 50 MCG/ACT nasal spray Place 2 sprays into both nostrils daily.   [DISCONTINUED] meclizine  (ANTIVERT ) 25 MG tablet Take 1 tablet (25 mg total) by mouth 3 (three) times daily as needed for dizziness.   No facility-administered medications prior to visit.    Review of Systems  Constitutional:  Negative for chills, diaphoresis and fever.  HENT:  Negative for congestion, ear discharge, ear pain, hearing loss, nosebleeds, sore throat and tinnitus.   Eyes:  Negative for photophobia, pain, discharge and redness.  Respiratory:  Negative for cough, shortness of breath, wheezing and stridor.   Cardiovascular:  Negative for chest pain, palpitations and leg swelling.  Gastrointestinal:  Negative for abdominal pain, blood in stool, constipation, diarrhea, nausea and vomiting.  Endocrine: Negative for polydipsia.  Genitourinary:  Negative for dysuria, flank pain, frequency, hematuria and urgency.  Musculoskeletal:  Negative for back pain, myalgias and neck pain.  Skin:  Negative for rash.  Allergic/Immunologic: Negative for environmental allergies.  Neurological:  Negative for dizziness, tremors, seizures, weakness and headaches.  Hematological:  Does not bruise/bleed easily.  Psychiatric/Behavioral:  Negative for hallucinations and suicidal ideas. The patient is not nervous/anxious.       Objective    BP 120/80   Pulse 61   Ht 6' (1.829 m)   Wt (!) 367 lb (166.5 kg)   SpO2 98%   BMI 49.77 kg/m    Physical Exam   General Appearance:    Severely obese male. Alert, cooperative, in no acute distress, appears stated age  Head:    Normocephalic, without obvious abnormality, atraumatic  Eyes:    PERRL, conjunctiva/corneas clear, EOM's intact, fundi    benign, both eyes  . Amblyopia of right eye  Ears:  Normal TM's and external ear canals, both ears  Nose:   Nares normal, septum  midline, mucosa normal, no drainage   or sinus tenderness  Throat:   Lips, mucosa, and tongue normal; teeth and gums normal  Neck:   Supple, symmetrical, trachea midline, no adenopathy;       thyroid:  No enlargement/tenderness/nodules; no carotid   bruit or JVD  Back:     Symmetric, no curvature, ROM normal, no CVA tenderness  Lungs:     Clear to auscultation bilaterally, respirations unlabored  Chest wall:    No tenderness or deformity  Heart:    Normal heart rate. Normal rhythm. No murmurs, rubs, or gallops.  S1 and S2 normal  Abdomen:     Soft, non-tender, bowel sounds active all four quadrants,    no masses, no organomegaly  Genitalia:    deferred  Rectal:    deferred  Extremities:   All extremities are intact. No cyanosis or edema  Pulses:   2+ and symmetric all extremities  Skin:   Skin color, texture, turgor normal, no rashes or lesions  Lymph nodes:   Cervical, supraclavicular, and axillary nodes normal  Neurologic:   CNII-XII intact. Normal strength, sensation and reflexes      throughout       Last depression screening scores    10/22/2023    3:05 PM 11/21/2022    8:22 AM 08/04/2022    1:59 PM  PHQ 2/9 Scores  PHQ - 2 Score 1 5 1   PHQ- 9 Score 4 13 5    Last fall risk screening    10/22/2023    3:05 PM  Fall Risk   Falls in the past year? 0  Number falls in past yr: 0  Injury with Fall? 0   Last Audit-C alcohol use screening    11/20/2022    2:52 PM  Alcohol Use Disorder Test (AUDIT)  1. How often do you have a drink containing alcohol? 0  3. How often do you have six or more drinks on one occasion? 0   A score of 3 or more in women, and 4 or more in men indicates increased risk for alcohol abuse, EXCEPT if all of the points are from question 1   No results found for any visits on 10/22/23.  Assessment & Plan    Routine Health Maintenance and Physical Exam  Exercise Activities and Dietary recommendations  Goals   None     Immunization History   Administered Date(s) Administered   Influenza, Seasonal, Injecte, Preservative Fre 11/21/2022   Influenza-Unspecified 11/28/2014, 12/11/2016, 12/10/2019, 12/12/2020   MMR 02/21/2017, 03/23/2017   Moderna Sars-Covid-2 Vaccination 01/19/2020   PFIZER(Purple Top)SARS-COV-2 Vaccination 03/31/2019, 04/21/2019   PPD Test 02/26/2017   Pneumococcal Polysaccharide-23 08/05/2008, 01/08/2013   Td 11/18/1998, 11/15/2017   Tdap 10/02/2007    Health Maintenance  Topic Date Due   HIV Screening  Never done   Hepatitis B Vaccines 19-59 Average Risk (1 of 3 - 19+ 3-dose series) Never done   Pneumococcal Vaccine: 50+ Years (2 of 2 - PCV) 01/08/2014   Zoster Vaccines- Shingrix (1 of 2) Never done   COVID-19 Vaccine (4 - 2024-25 season) 10/29/2022   HEMOGLOBIN A1C  02/06/2023   Diabetic kidney evaluation - Urine ACR  08/04/2023   Diabetic kidney evaluation - eGFR measurement  08/07/2023   INFLUENZA VACCINE  09/28/2023   OPHTHALMOLOGY EXAM  07/23/2024   DTaP/Tdap/Td (4 - Td or Tdap) 11/16/2027   Colonoscopy  03/29/2031  Hepatitis C Screening  Completed   HPV VACCINES  Aged Out   Meningococcal B Vaccine  Aged Out    Discussed health benefits of physical activity, and encouraged him to engage in regular exercise appropriate for his age and condition.  He is planning on getting Shingrix at his pharmacy, and Prevnar-20 some time after that  2. Prostate cancer screening  - PSA Total (Reflex To Free)  3. Type 2 diabetes mellitus without complication, without long-term current use of insulin (HCC)  - Urine Albumin/Creatinine with ratio (send out) [LAB689] - Hemoglobin A1c  Consider adding Mounjaro  or Ozempic after reviewing labs.   4. Primary hypertension Well controlled.  Continue current medications.   - CBC - Comprehensive metabolic panel with GFR - Lipid panel  5. Obesity, morbid (HCC) Is having more trouble controlling weight this year. Would benefit from GLP-1 for diabetes.   6.  Other mixed anxiety disorders Much improved since changing jobs this year. Is working on weaning sertraline .        Nancyann Perry, MD  [facility] [phone] (phone) [phone] (fax)  [facility]

## 2023-10-23 LAB — MICROALBUMIN / CREATININE URINE RATIO
Creatinine, Urine: 105.7 mg/dL
Microalb/Creat Ratio: 12 mg/g{creat} (ref 0–29)
Microalbumin, Urine: 12.2 ug/mL

## 2023-10-23 LAB — SPECIMEN STATUS REPORT

## 2023-10-24 LAB — CBC
Hematocrit: 44.9 % (ref 37.5–51.0)
Hemoglobin: 14.5 g/dL (ref 13.0–17.7)
MCH: 30.2 pg (ref 26.6–33.0)
MCHC: 32.3 g/dL (ref 31.5–35.7)
MCV: 94 fL (ref 79–97)
Platelets: 223 x10E3/uL (ref 150–450)
RBC: 4.8 x10E6/uL (ref 4.14–5.80)
RDW: 13 % (ref 11.6–15.4)
WBC: 6.3 x10E3/uL (ref 3.4–10.8)

## 2023-10-24 LAB — COMPREHENSIVE METABOLIC PANEL WITH GFR
ALT: 19 IU/L (ref 0–44)
AST: 21 IU/L (ref 0–40)
Albumin: 4.2 g/dL (ref 3.8–4.9)
Alkaline Phosphatase: 58 IU/L (ref 44–121)
BUN/Creatinine Ratio: 19 (ref 9–20)
BUN: 18 mg/dL (ref 6–24)
Bilirubin Total: 0.8 mg/dL (ref 0.0–1.2)
CO2: 25 mmol/L (ref 20–29)
Calcium: 9.6 mg/dL (ref 8.7–10.2)
Chloride: 102 mmol/L (ref 96–106)
Creatinine, Ser: 0.97 mg/dL (ref 0.76–1.27)
Globulin, Total: 2.5 g/dL (ref 1.5–4.5)
Glucose: 117 mg/dL — ABNORMAL HIGH (ref 70–99)
Potassium: 4.4 mmol/L (ref 3.5–5.2)
Sodium: 142 mmol/L (ref 134–144)
Total Protein: 6.7 g/dL (ref 6.0–8.5)
eGFR: 94 mL/min/1.73 (ref >59)

## 2023-10-24 LAB — PSA TOTAL (REFLEX TO FREE): Prostate Specific Ag, Serum: 0.6 ng/mL (ref 0.0–4.0)

## 2023-10-24 LAB — HEMOGLOBIN A1C
Est. average glucose Bld gHb Est-mCnc: 114 mg/dL
Hgb A1c MFr Bld: 5.6 % (ref 4.8–5.6)

## 2023-10-24 LAB — LIPID PANEL
Chol/HDL Ratio: 3.3 ratio (ref 0.0–5.0)
Cholesterol, Total: 163 mg/dL (ref 100–199)
HDL: 50 mg/dL (ref >39)
LDL Chol Calc (NIH): 91 mg/dL (ref 0–99)
Triglycerides: 122 mg/dL (ref 0–149)
VLDL Cholesterol Cal: 22 mg/dL (ref 5–40)

## 2023-10-25 ENCOUNTER — Ambulatory Visit: Payer: Self-pay | Admitting: Family Medicine

## 2023-10-25 DIAGNOSIS — E119 Type 2 diabetes mellitus without complications: Secondary | ICD-10-CM

## 2023-10-25 MED ORDER — TIRZEPATIDE 2.5 MG/0.5ML ~~LOC~~ SOAJ
2.5000 mg | SUBCUTANEOUS | 0 refills | Status: DC
Start: 2023-10-25 — End: 2023-11-16

## 2023-11-16 ENCOUNTER — Other Ambulatory Visit: Payer: Self-pay | Admitting: Family Medicine

## 2023-11-16 DIAGNOSIS — E119 Type 2 diabetes mellitus without complications: Secondary | ICD-10-CM

## 2023-12-06 ENCOUNTER — Encounter: Payer: Self-pay | Admitting: Family Medicine

## 2023-12-11 NOTE — Telephone Encounter (Signed)
 Done 12/10/23

## 2023-12-23 ENCOUNTER — Other Ambulatory Visit: Payer: Self-pay | Admitting: Family Medicine

## 2023-12-23 DIAGNOSIS — F410 Panic disorder [episodic paroxysmal anxiety] without agoraphobia: Secondary | ICD-10-CM

## 2024-01-09 ENCOUNTER — Encounter: Payer: Self-pay | Admitting: Emergency Medicine

## 2024-01-09 ENCOUNTER — Other Ambulatory Visit: Payer: Self-pay

## 2024-01-09 ENCOUNTER — Emergency Department
Admission: EM | Admit: 2024-01-09 | Discharge: 2024-01-09 | Disposition: A | Discharge status: Home / Self Care | Attending: Emergency Medicine | Admitting: Emergency Medicine

## 2024-01-09 DIAGNOSIS — E119 Type 2 diabetes mellitus without complications: Secondary | ICD-10-CM | POA: Diagnosis not present

## 2024-01-09 DIAGNOSIS — X58XXXA Exposure to other specified factors, initial encounter: Secondary | ICD-10-CM | POA: Diagnosis not present

## 2024-01-09 DIAGNOSIS — S3991XA Unspecified injury of abdomen, initial encounter: Secondary | ICD-10-CM | POA: Diagnosis present

## 2024-01-09 DIAGNOSIS — R197 Diarrhea, unspecified: Secondary | ICD-10-CM | POA: Diagnosis not present

## 2024-01-09 DIAGNOSIS — I10 Essential (primary) hypertension: Secondary | ICD-10-CM | POA: Insufficient documentation

## 2024-01-09 DIAGNOSIS — S3011XA Contusion of abdominal wall, initial encounter: Secondary | ICD-10-CM | POA: Insufficient documentation

## 2024-01-09 DIAGNOSIS — T148XXA Other injury of unspecified body region, initial encounter: Secondary | ICD-10-CM

## 2024-01-09 LAB — COMPREHENSIVE METABOLIC PANEL WITH GFR
ALT: 25 U/L (ref 0–44)
AST: 26 U/L (ref 15–41)
Albumin: 4.3 g/dL (ref 3.5–5.0)
Alkaline Phosphatase: 58 U/L (ref 38–126)
Anion gap: 9 (ref 5–15)
BUN: 17 mg/dL (ref 6–20)
CO2: 30 mmol/L (ref 22–32)
Calcium: 9.4 mg/dL (ref 8.9–10.3)
Chloride: 102 mmol/L (ref 98–111)
Creatinine, Ser: 0.97 mg/dL (ref 0.61–1.24)
GFR, Estimated: >60 mL/min (ref >60)
Glucose, Bld: 110 mg/dL — ABNORMAL HIGH (ref 70–99)
Potassium: 4.2 mmol/L (ref 3.5–5.1)
Sodium: 140 mmol/L (ref 135–145)
Total Bilirubin: 0.7 mg/dL (ref 0.0–1.2)
Total Protein: 7.1 g/dL (ref 6.5–8.1)

## 2024-01-09 LAB — URINALYSIS, ROUTINE W REFLEX MICROSCOPIC
Bilirubin Urine: NEGATIVE
Glucose, UA: NEGATIVE mg/dL
Hgb urine dipstick: NEGATIVE
Ketones, ur: NEGATIVE mg/dL
Leukocytes,Ua: NEGATIVE
Nitrite: NEGATIVE
Protein, ur: NEGATIVE mg/dL
Specific Gravity, Urine: 1.018 (ref 1.005–1.030)
pH: 6 (ref 5.0–8.0)

## 2024-01-09 LAB — CBC
HCT: 42.8 % (ref 39.0–52.0)
Hemoglobin: 14.4 g/dL (ref 13.0–17.0)
MCH: 30.7 pg (ref 26.0–34.0)
MCHC: 33.6 g/dL (ref 30.0–36.0)
MCV: 91.3 fL (ref 80.0–100.0)
Platelets: 222 K/uL (ref 150–400)
RBC: 4.69 MIL/uL (ref 4.22–5.81)
RDW: 13.5 % (ref 11.5–15.5)
WBC: 6.6 K/uL (ref 4.0–10.5)
nRBC: 0 % (ref 0.0–0.2)

## 2024-01-09 LAB — LIPASE, BLOOD: Lipase: 31 U/L (ref 11–51)

## 2024-01-09 NOTE — ED Provider Notes (Signed)
 [facility] Provider Note    Event Date/Time   First MD Initiated Contact with Patient 01/09/24 [phone]     (approximate)   History   Diarrhea   HPI  [name] is a 53 y.o. male   presents to the ED with concerns of bruising to the right side of his umbilicus after Mounjaro  injection that he gave himself on Friday.  Patient is using the autoinjector.  He states he has not noticed the bruise before today.  He googled several sites and after reading about pancreatitis and aortic aneurysm he presents to the ED for evaluation.  Patient reports some diarrhea yesterday but no vomiting.  He has been taking Mounjaro  since July.  He denies any abdominal pain, nausea or vomiting today.  No previous history of pancreatitis.  Patient has history of diabetes type 2, hypertension, bradycardia, proteinuria, gout, migraine and anxiety disorder.      Physical Exam   Triage Vital Signs: ED Triage Vitals [01/09/24 0909]  Encounter Vitals Group     BP (!) 147/83     Girls Systolic BP Percentile      Girls Diastolic BP Percentile      Boys Systolic BP Percentile      Boys Diastolic BP Percentile      Pulse Rate 66     Resp 18     Temp 98.5 F (36.9 C)     Temp Source Oral     SpO2 100 %     Weight (!) 350 lb (158.8 kg)     Height 6' (1.829 m)     Head Circumference      Peak Flow      Pain Score 0     Pain Loc      Pain Education      Exclude from Growth Chart     Most recent vital signs: Vitals:   01/09/24 0909  BP: (!) 147/83  Pulse: 66  Resp: 18  Temp: 98.5 F (36.9 C)  SpO2: 100%     General: Awake, no distress.  Alert, talkative, cooperative. CV:  Good peripheral perfusion.  Resp:  Normal effort.  Abd:  No distention.  Soft, bowel sounds normoactive x 4 quadrants.  There is a 2-1/2 to 3 cm resolving ecchymotic area in various stages of coloration with the outside edge being yellow and center being more purple.  No drainage.  No evidence to  suggest cellulitis. Other:     ED Results / Procedures / Treatments   Labs (all labs ordered are listed, but only abnormal results are displayed) Labs Reviewed  COMPREHENSIVE METABOLIC PANEL WITH GFR - Abnormal; Notable for the following components:      Result Value   Glucose, Bld 110 (*)    All other components within normal limits  URINALYSIS, ROUTINE W REFLEX MICROSCOPIC - Abnormal; Notable for the following components:   Color, Urine YELLOW (*)    APPearance CLEAR (*)    All other components within normal limits  LIPASE, BLOOD  CBC     PROCEDURES:  Critical Care performed:   Procedures   MEDICATIONS ORDERED IN ED: Medications - No data to display   IMPRESSION / MDM / ASSESSMENT AND PLAN / ED COURSE  I reviewed the triage vital signs and the nursing notes.   Differential diagnosis includes, but is not limited to, traumatic bruising secondary to injection, cellulitis, decreased platelets, anemia  53 year old male presents to the ED with a ecchymotic area  to his abdomen where he gave himself a Mounjaro  injection.  Patient became concerned after he developed a ecchymotic area and began using resources on the Internet that suggested multiple reasons why he would have this area.  Lab work was done and patient was reassured.  Patient is to continue with this medication and follow-up with his PCP.      Patient's presentation is most consistent with acute complicated illness / injury requiring diagnostic workup.  FINAL CLINICAL IMPRESSION(S) / ED DIAGNOSES   Final diagnoses:  Traumatic ecchymosis     Rx / DC Orders   ED Discharge Orders     None        Note:  This document was prepared using Dragon voice recognition software and may include unintentional dictation errors.   Saunders Shona CROME, PA-C 01/09/24 1216    Bradler, Evan K, MD 01/09/24 1520

## 2024-01-09 NOTE — ED Triage Notes (Signed)
 Patient to ED via POV for bruising around navel after mounjuaro injection on Friday. States he had some diarrhea yesterday- none today. Denies abd pain or N/V. Concerned for pancreatis.

## 2024-01-09 NOTE — Discharge Instructions (Signed)
 Follow-up with your care provider if any continued problems or concerns.  The area on your abdomen should continue to fade gradually and continue turning yellow before completely disappearing.

## 2024-01-11 ENCOUNTER — Other Ambulatory Visit: Payer: Self-pay | Admitting: Family Medicine

## 2024-01-11 DIAGNOSIS — E119 Type 2 diabetes mellitus without complications: Secondary | ICD-10-CM

## 2024-01-11 DIAGNOSIS — I10 Essential (primary) hypertension: Secondary | ICD-10-CM

## 2024-01-19 ENCOUNTER — Other Ambulatory Visit: Payer: Self-pay | Admitting: Family Medicine

## 2024-02-08 ENCOUNTER — Other Ambulatory Visit: Payer: Self-pay | Admitting: Family Medicine

## 2024-02-08 DIAGNOSIS — E119 Type 2 diabetes mellitus without complications: Secondary | ICD-10-CM

## 2024-03-07 ENCOUNTER — Other Ambulatory Visit: Payer: Self-pay | Admitting: Family Medicine

## 2024-03-07 DIAGNOSIS — E119 Type 2 diabetes mellitus without complications: Secondary | ICD-10-CM

## 2024-03-26 ENCOUNTER — Emergency Department

## 2024-03-26 ENCOUNTER — Other Ambulatory Visit: Payer: Self-pay

## 2024-03-26 ENCOUNTER — Emergency Department
Admission: EM | Admit: 2024-03-26 | Discharge: 2024-03-26 | Disposition: A | Discharge status: Home / Self Care | Attending: Emergency Medicine | Admitting: Emergency Medicine

## 2024-03-26 DIAGNOSIS — K8020 Calculus of gallbladder without cholecystitis without obstruction: Secondary | ICD-10-CM | POA: Insufficient documentation

## 2024-03-26 DIAGNOSIS — E119 Type 2 diabetes mellitus without complications: Secondary | ICD-10-CM | POA: Insufficient documentation

## 2024-03-26 DIAGNOSIS — I10 Essential (primary) hypertension: Secondary | ICD-10-CM | POA: Insufficient documentation

## 2024-03-26 DIAGNOSIS — R10A1 Flank pain, right side: Secondary | ICD-10-CM | POA: Diagnosis present

## 2024-03-26 LAB — URINALYSIS, ROUTINE W REFLEX MICROSCOPIC
Bilirubin Urine: NEGATIVE
Glucose, UA: NEGATIVE mg/dL
Hgb urine dipstick: NEGATIVE
Ketones, ur: NEGATIVE mg/dL
Leukocytes,Ua: NEGATIVE
Nitrite: NEGATIVE
Protein, ur: NEGATIVE mg/dL
Specific Gravity, Urine: 1.02 (ref 1.005–1.030)
pH: 5 (ref 5.0–8.0)

## 2024-03-26 LAB — CBC WITH DIFFERENTIAL/PLATELET
Abs Immature Granulocytes: 0.01 10*3/uL (ref 0.00–0.07)
Basophils Absolute: 0.1 10*3/uL (ref 0.0–0.1)
Basophils Relative: 1 %
Eosinophils Absolute: 0.3 10*3/uL (ref 0.0–0.5)
Eosinophils Relative: 4 %
HCT: 45.9 % (ref 39.0–52.0)
Hemoglobin: 15.2 g/dL (ref 13.0–17.0)
Immature Granulocytes: 0 %
Lymphocytes Relative: 31 %
Lymphs Abs: 2.8 10*3/uL (ref 0.7–4.0)
MCH: 30.7 pg (ref 26.0–34.0)
MCHC: 33.1 g/dL (ref 30.0–36.0)
MCV: 92.7 fL (ref 80.0–100.0)
Monocytes Absolute: 0.7 10*3/uL (ref 0.1–1.0)
Monocytes Relative: 7 %
Neutro Abs: 5.1 10*3/uL (ref 1.7–7.7)
Neutrophils Relative %: 57 %
Platelets: 252 10*3/uL (ref 150–400)
RBC: 4.95 MIL/uL (ref 4.22–5.81)
RDW: 13.1 % (ref 11.5–15.5)
WBC: 8.9 10*3/uL (ref 4.0–10.5)
nRBC: 0 % (ref 0.0–0.2)

## 2024-03-26 LAB — HEPATIC FUNCTION PANEL
ALT: 24 U/L (ref 0–44)
AST: 27 U/L (ref 15–41)
Albumin: 4.7 g/dL (ref 3.5–5.0)
Alkaline Phosphatase: 64 U/L (ref 38–126)
Bilirubin, Direct: 0.2 mg/dL (ref 0.0–0.2)
Indirect Bilirubin: 0.3 mg/dL (ref 0.3–0.9)
Total Bilirubin: 0.5 mg/dL (ref 0.0–1.2)
Total Protein: 7.7 g/dL (ref 6.5–8.1)

## 2024-03-26 LAB — BASIC METABOLIC PANEL WITH GFR
Anion gap: 11 (ref 5–15)
BUN: 17 mg/dL (ref 6–20)
CO2: 27 mmol/L (ref 22–32)
Calcium: 9.3 mg/dL (ref 8.9–10.3)
Chloride: 104 mmol/L (ref 98–111)
Creatinine, Ser: 1.21 mg/dL (ref 0.61–1.24)
GFR, Estimated: >60 mL/min
Glucose, Bld: 121 mg/dL — ABNORMAL HIGH (ref 70–99)
Potassium: 4.2 mmol/L (ref 3.5–5.1)
Sodium: 142 mmol/L (ref 135–145)

## 2024-03-26 LAB — LIPASE, BLOOD: Lipase: 48 U/L (ref 11–51)

## 2024-03-26 NOTE — ED Triage Notes (Signed)
 Pt reports right flank pain that began pta, pt denies hx kidney stones. Denies dysuria or difficulty urinating.

## 2024-03-26 NOTE — ED Provider Notes (Signed)
 "  [facility] Provider Note    Event Date/Time   First MD Initiated Contact with Patient 03/26/24 [phone]     (approximate)   History   Chief Complaint: Flank Pain   HPI  [name] is a 54 y.o. male with a history of hypertension diabetes morbid obesity who comes ED complaining of right flank pain that started after eating a bowl of ice cream tonight.  Pain was persistent for several hours, but has now resolved.  Denies dysuria, no chest pain shortness of breath cough fever or chills.  Denies known history of kidney stones or gallstones.        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   History of hydrocele    Hypertension     Current Outpatient Rx   Order #: [id number] Class: Historical Med   Order #: [id number] Class: Normal   Order #: [id number] Class: Normal   Order #: [id number] Class: Normal   Order #: [id number] Class: Normal   Order #: [id number] Class: Historical Med   Order #: [id number] Class: Normal   Order #: [id number] Class: Normal   Order #: [id number] Class: Historical Med   Order #: [id number] Class: Historical Med   Order #: [id number] Class: Normal   Order #: [id number] Class: Normal    Past Surgical History:  Procedure Laterality Date   ANTERIOR CERVICAL DECOMP/DISCECTOMY FUSION  03/2017   Dr. Mavis, [facility]   COLONOSCOPY N/A 03/28/2021   Procedure: COLONOSCOPY;  Surgeon: Jinny Carmine, MD;  Location: [facility];  Service: Endoscopy;  Laterality: N/A;   FRACTURE SURGERY  2006   KNEE SURGERY  2006   left knee reconstruction after tibial fracture   LEG SURGERY  1975   leg and foot surgery; right lower leg circa   POLYPECTOMY  03/28/2021   Procedure: POLYPECTOMY;  Surgeon: Jinny Carmine, MD;  Location: [facility];  Service:  Endoscopy;;   SPINE SURGERY  Feb 2019   Anterior cervical decompression of C4/C6 (I think)    Physical Exam   Triage Vital Signs: ED Triage Vitals  Encounter Vitals Group     BP 03/26/24 0013 (!) 168/85     Girls Systolic BP Percentile --      Girls Diastolic BP Percentile --      Boys Systolic BP Percentile --      Boys Diastolic BP Percentile --      Pulse Rate 03/26/24 0013 (!) 55     Resp 03/26/24 0013 20     Temp 03/26/24 0012 98.5 F (36.9 C)     Temp Source 03/26/24 0442 Oral     SpO2 03/26/24 0013 99 %     Weight 03/26/24 0011 (!) 351 lb (159.2 kg)     Height 03/26/24 0011 6' (1.829 m)     Head Circumference --      Peak Flow --      Pain Score 03/26/24 0011 7     Pain Loc --      Pain Education --      Exclude from Growth Chart --     Most recent vital signs: Vitals:   03/26/24 0013 03/26/24 0442  BP: (!) 168/85 (!) 143/81  Pulse: (!) 55 64  Resp: 20 18  Temp:  98.9 F (37.2 C)  SpO2:  99% 99%    General: Awake, no distress.  CV:  Good peripheral perfusion.  Regular rate rhythm Resp:  Normal effort.  Clear lungs Abd:  No distention.  Soft nontender Other:  Moist oral mucosa   ED Results / Procedures / Treatments   Labs (all labs ordered are listed, but only abnormal results are displayed) Labs Reviewed  BASIC METABOLIC PANEL WITH GFR - Abnormal; Notable for the following components:      Result Value   Glucose, Bld 121 (*)    All other components within normal limits  URINALYSIS, ROUTINE W REFLEX MICROSCOPIC - Abnormal; Notable for the following components:   Color, Urine YELLOW (*)    APPearance CLEAR (*)    All other components within normal limits  CBC WITH DIFFERENTIAL/PLATELET  LIPASE, BLOOD  HEPATIC FUNCTION PANEL     EKG    RADIOLOGY CT abdomen pelvis interpreted by me, negative for ureterolithiasis.  Radiology report reviewed Ultrasound reveals multiple small gallstones, no  cholecystitis   PROCEDURES:  Procedures   MEDICATIONS ORDERED IN ED: Medications - No data to display   IMPRESSION / MDM / ASSESSMENT AND PLAN / ED COURSE  I reviewed the triage vital signs and the nursing notes.  DDx: Cholelithiasis, pancreatitis, cholecystitis, ureterolithiasis, GERD  Patient's presentation is most consistent with acute presentation with potential threat to life or bodily function.  Patient presents with right upper quadrant pain which is since resolved.  Exam is unremarkable.  Labs and vitals reassuring.  Ultrasound does reveal cholelithiasis, uncomplicated.  Stable for discharge and outpatient follow-up with general surgery.       FINAL CLINICAL IMPRESSION(S) / ED DIAGNOSES   Final diagnoses:  Calculus of gallbladder without cholecystitis without obstruction     Rx / DC Orders   ED Discharge Orders     None        Note:  This document was prepared using Dragon voice recognition software and may include unintentional dictation errors.   Viviann Pastor, MD 03/26/24 [phone]  "

## 2024-03-26 NOTE — Discharge Instructions (Signed)
 Your ultrasound reveals small gallstones in the gallbladder causing the right upper abdomen symptoms after eating.  Avoid large or high-fat meals, and follow-up with general surgery for further evaluation.  1. Cholelithiasis and gallbladder sludge. Borderline gallbladder wall  thickening at 3.4 mm, but negative sonographic murphy sign.

## 2024-03-31 ENCOUNTER — Other Ambulatory Visit: Payer: Self-pay | Admitting: Family Medicine

## 2024-03-31 DIAGNOSIS — E119 Type 2 diabetes mellitus without complications: Secondary | ICD-10-CM
